# Patient Record
Sex: Male | Born: 1940 | Hispanic: No | Marital: Married | Smoking: Former smoker
Health system: Southern US, Community
[De-identification: ages and names within clinical notes are randomized; demographics above are authoritative.]

## PROBLEM LIST (undated history)

## (undated) ENCOUNTER — Inpatient Hospital Stay: Admission: EM | Payer: Self-pay

## (undated) DIAGNOSIS — I10 Essential (primary) hypertension: Secondary | ICD-10-CM

## (undated) DIAGNOSIS — M1990 Unspecified osteoarthritis, unspecified site: Secondary | ICD-10-CM

## (undated) DIAGNOSIS — C679 Malignant neoplasm of bladder, unspecified: Secondary | ICD-10-CM

## (undated) DIAGNOSIS — I714 Abdominal aortic aneurysm, without rupture, unspecified: Secondary | ICD-10-CM

## (undated) DIAGNOSIS — R351 Nocturia: Secondary | ICD-10-CM

## (undated) DIAGNOSIS — I2510 Atherosclerotic heart disease of native coronary artery without angina pectoris: Secondary | ICD-10-CM

## (undated) DIAGNOSIS — E785 Hyperlipidemia, unspecified: Secondary | ICD-10-CM

## (undated) DIAGNOSIS — I5032 Chronic diastolic (congestive) heart failure: Secondary | ICD-10-CM

## (undated) DIAGNOSIS — Z9861 Coronary angioplasty status: Secondary | ICD-10-CM

## (undated) DIAGNOSIS — Z8551 Personal history of malignant neoplasm of bladder: Secondary | ICD-10-CM

## (undated) HISTORY — DX: Atherosclerotic heart disease of native coronary artery without angina pectoris: I25.10

## (undated) HISTORY — DX: Abdominal aortic aneurysm, without rupture: I71.4

## (undated) HISTORY — DX: Abdominal aortic aneurysm, without rupture, unspecified: I71.40

## (undated) HISTORY — PX: LUMBAR FUSION: SHX111

## (undated) HISTORY — DX: Personal history of malignant neoplasm of bladder: Z85.51

## (undated) HISTORY — DX: Essential (primary) hypertension: I10

---

## 1994-11-17 HISTORY — PX: CORONARY ANGIOPLASTY: SHX604

## 1999-11-18 HISTORY — PX: CORONARY ANGIOPLASTY WITH STENT PLACEMENT: SHX49

## 2000-04-13 ENCOUNTER — Inpatient Hospital Stay (HOSPITAL_COMMUNITY): Admission: EM | Admit: 2000-04-13 | Discharge: 2000-04-16 | Payer: Self-pay | Admitting: *Deleted

## 2000-04-13 ENCOUNTER — Encounter: Payer: Self-pay | Admitting: Emergency Medicine

## 2000-04-14 ENCOUNTER — Encounter: Payer: Self-pay | Admitting: Cardiovascular Disease

## 2007-04-06 ENCOUNTER — Inpatient Hospital Stay (HOSPITAL_COMMUNITY): Admission: EM | Admit: 2007-04-06 | Discharge: 2007-04-10 | Payer: Self-pay | Admitting: Emergency Medicine

## 2010-08-27 ENCOUNTER — Ambulatory Visit (HOSPITAL_COMMUNITY)
Admission: RE | Admit: 2010-08-27 | Discharge: 2010-08-27 | Payer: Self-pay | Source: Home / Self Care | Admitting: Urology

## 2010-08-27 HISTORY — PX: TRANSURETHRAL RESECTION OF BLADDER TUMOR: SHX2575

## 2010-10-22 ENCOUNTER — Ambulatory Visit
Admission: RE | Admit: 2010-10-22 | Discharge: 2010-10-22 | Payer: Self-pay | Source: Home / Self Care | Attending: Urology | Admitting: Urology

## 2010-10-22 HISTORY — PX: OTHER SURGICAL HISTORY: SHX169

## 2011-01-28 LAB — POCT I-STAT, CHEM 8
BUN: 20 mg/dL (ref 6–23)
Calcium, Ion: 1.19 mmol/L (ref 1.12–1.32)
Chloride: 108 mEq/L (ref 96–112)
Glucose, Bld: 105 mg/dL — ABNORMAL HIGH (ref 70–99)
HCT: 42 % (ref 39.0–52.0)
Potassium: 4 mEq/L (ref 3.5–5.1)

## 2011-01-30 LAB — CBC
HCT: 42.4 % (ref 39.0–52.0)
MCH: 29.7 pg (ref 26.0–34.0)
MCV: 84.8 fL (ref 78.0–100.0)
RBC: 5 MIL/uL (ref 4.22–5.81)
WBC: 7.8 10*3/uL (ref 4.0–10.5)

## 2011-01-30 LAB — BASIC METABOLIC PANEL
BUN: 22 mg/dL (ref 6–23)
CO2: 29 mEq/L (ref 19–32)
Chloride: 107 mEq/L (ref 96–112)
Creatinine, Ser: 0.91 mg/dL (ref 0.4–1.5)
GFR calc Af Amer: >60 mL/min (ref >60)
Potassium: 3.8 mEq/L (ref 3.5–5.1)

## 2011-04-01 NOTE — Discharge Summary (Signed)
Ricardo [name], USERY NO.:  [id number]   MEDICAL RECORD NO.:  [id number]          PATIENT TYPE:  INP   LOCATION:  3728                         FACILITY:  [facility]   PHYSICIAN:  Ricardo [name], MDDATE OF BIRTH:  March 04, 1941   DATE OF ADMISSION:  04/05/2007  DATE OF DISCHARGE:  04/10/2007                               DISCHARGE SUMMARY   PRIMARY CARE Ricardo [name]:  Dr. Nicholos [name].   ADMITTING DIAGNOSES:  1. Cellulitis.  2. Tachycardia and __________ .  3. Hypertension.  4. History of coronary artery disease.   DISCHARGE DIAGNOSES:  1. Cellulitis of the left lower leg.  2. Hypertension.  3. History of coronary artery disease.   DISCHARGE MEDICATIONS:  1. Augmentin 875 mg p.o. twice daily x1 week.  2. Cardizem CD 120 mg once daily.  3. Lisinopril 40 mg p.o. once daily.  4. Maxzide 37.5/25, 1 tab p.o. once daily.  5. Crestor.   CONSULTS:  None.   ESSENTIALLY LABORATORY:  The blood culture grew G. Streptococcus.   BRIEF HISTORY AND HOSPITAL COURSE:  The patient is a 70 year old male  with past medical history significant for coronary artery disease,  status post angioplasty and hypertension.  The patient presented with  fever, chills, and cellulitis around the left ankle.  On presentation,  the white blood count was 15,000.  Blood culture done on admission grew  group G Streptococcus.  On admission, the patient was started on IV  vancomycin.  Based on the blood culture, this was changed to IV  ceftriaxone.  The patient's cellulitis has improved significantly.  White count is down to normal.  He will be discharged back home on  Augmentin 875 mg twice daily x1 week.   DISCHARGE PLANS:  1. Discharge home today.  2. Follow up with the primary care Ricardo [name], Dr. Elias [name] in 1      week.  3. Augmentin 875 mg p.o. twice daily x1 week.  4. Activity as tolerated.  5. Regular diet.      Ricardo Chapel, MD  Electronically Signed     SIO/MEDQ  D:   04/10/2007  T:  04/10/2007  Job:  [id number]   cc:   Ricardo [name]. Ricardo [name], M.D.

## 2011-04-01 NOTE — H&P (Signed)
Ricardo [name], Ricardo [name]:  [id number]   MEDICAL RECORD NO.:  [id number]          PATIENT TYPE:  EMS   LOCATION:  MAJO                         FACILITY:  [facility]   PHYSICIAN:  Hollice Espy, M.D.DATE OF BIRTH:  1941-04-29   DATE OF ADMISSION:  04/05/2007  DATE OF DISCHARGE:                              HISTORY & PHYSICAL   PRIMARY CARE PHYSICIAN:  Robert A. Nicholos Johns, M.D.   ATTENDING PHYSICIAN:  Corinna L. Lendell Caprice, M.D.   CHIEF COMPLAINT:  Rigors and chills.   HISTORY OF PRESENT ILLNESS:  Patient is a 70 year old white male with a  past medical history of CAD, status post angioplasty about 10 years ago,  plus hypertension, who was recently out to visit in [place].  He  returned several days ago.  He does not recall any bug bites, animal  bites, or injury to his left leg, but today developed rigors and chills  which were so severe that he could barely stand.  He came into the  emergency room and noted to be tachycardic, having a temperature as well  as having some erythema of the left ankle concerning for cellulitis.  Patient had labs checked.  He was found to have a white count of 15.  His urinalysis and chest x-ray were unremarkable.   Patient was given a dose of vancomycin in the emergency room.  He says  he feels a little bit better but still feels slightly rough.  He denies  any headache, visual changes, dysphagia, chest pain, palpitations,  shortness of breath, wheezing, coughing.  No abdominal pain.  No  hematuria, dysuria, constipation, diarrhea, focal extremity numbness,  weakness, or pain.  He does not note any discomfort in that left ankle.  His review of systems is otherwise negative.   Patient's past medical history includes hypertension, CAD, status post  angioplasty about 10 years ago.   MEDICATIONS:  He is on lisinopril 40, triamterene/hydrochlorothiazide,  Cardizem, and Crestor.   ALLERGIES:  He has no known drug allergies.   SOCIAL  HISTORY:  He denies any tobacco, alcohol, or drug use.   FAMILY HISTORY:  Noncontributory.   PHYSICAL EXAMINATION:  VITALS ON ADMISSION:  Temp 103.4, heart rate 136,  now down to 111, blood pressure 156/85, respirations 20, O2 sat 98% on  room air.  GENERAL:  Patient is alert and oriented x3, in no apparent distress.  HEENT:  Normocephalic and atraumatic.  Mucous membranes are slightly  dry.  NECK:  He has no carotid bruits.  HEART:  Regular rhythm.  Mild tachycardia.  LUNGS:  Clear to auscultation bilaterally.  ABDOMEN:  Soft, nontender, nondistended.  Positive bowel sounds.  EXTREMITIES:  No clubbing, cyanosis or edema.  He has some erythematous  areas of the left ankle with two small swollen, slightly raised areas  consistent with a possible previous small bite.   LAB WORK:  White count 15.3, H&H 14.6 and 42.8, MCV 85, platelet count  123, a 91% shift.  Sodium 140, potassium 2.7, chloride 109, bicarb 25,  BUN 23, creatinine 1.2, glucose 114, CPK 31.5, MB less than  1.  Troponin  I less than 0.05.  Urinalysis just notes a moderate amount of  hemoglobin, total protein of 30, and 7-10 red cells.   Chest x-ray is as per HPI.   EKG shows a sinus tachycardia.   ASSESSMENT/PLAN:  1. Cellulitis:  Because of the possibility of a staph infection, we      will put the patient on vancomycin plus Avelox to cover for full-      spectrum antibiotics.  The other concerning issues with the rapid      respiratory rate and tachycardia, especially in a patient with a      recent plane trip, do not want to miss out on a possible pulmonary      embolus.  Will also check an arterial blood gas.  If he shows signs      of hypoxia, will check a CT angio of the chest.  2. Tachycardia and tachypnea:  Please see #1.  3. Hypertension:  Will continue the patient's Cardizem.  4. Coronary artery disease:  Continue Crestor.      Hollice Espy, M.D.  Electronically Signed     SKK/MEDQ  D:   04/06/2007  T:  04/06/2007  Job:  [id number]   cc:   Molly Maduro A. Nicholos Johns, M.D.

## 2011-04-04 NOTE — Discharge Summary (Signed)
Ricardo [name], [name]:  [id number]   MEDICAL RECORD NO.:  [id number]          PATIENT TYPE:  INP   LOCATION:  3728                         FACILITY:  [facility]   PHYSICIAN:  Barnetta Chapel, MDDATE OF BIRTH:  January 23, 1941   DATE OF ADMISSION:  04/05/2007  DATE OF DISCHARGE:  04/10/2007                               DISCHARGE SUMMARY   PRIMARY CARE Ricardo [name]:  Dr. Elias [name].   DISCHARGE DIAGNOSES:  1. Cellulitis of the left lower extremity.  2. Hypertension.  3. History of coronary artery disease.   DISCHARGE MEDICATIONS:  1. Augmentin 875 mg p.o. twice daily for 1 week.  2. Toprol-XL 50 mg p.o. once daily.  3. Lisinopril 40 mg p.o. once daily.  4. Crestor.   CONSULTATIONS:  None.   LABORATORY DATA:  The blood culture grew group G Streptococcus.   BRIEF HISTORY AND HOSPITAL COURSE:  Refer to the H&P done on Apr 05, 2007.  The patient is a 70 year old male with past medical history  significant for coronary artery disease, status post angioplasty, and  hypertension.  The patient presented with a cellulitis of the lower  extremity edema.   The patient was admitted to the regular medical floor.  He was initially  started on IV Avelox and IV vancomycin.  Blood was sent for culture and  sensitivity; this eventually grew out Streptococcus.  The patient's  antibiotics were changed to Augmentin on discharge.  During his stay in  the hospital, his blood pressure was noted to be elevated; he was  initially put on Norvasc.  However, the patient preferred to be started  back on Toprol-XL.  The patient is being discharged home on Toprol-XL.  There will be need to monitor the patient's blood pressure.   DISCHARGE PLANS:  1. To discharge the patient home today.  2. Augmentin 875 mg p.o. twice daily for 1 week.  3. Cardiac diet.  4. Activity as tolerated.  5. Follow up with the primary care Ricardo [name] in 1 week.      Barnetta Chapel, MD  Electronically  Signed     SIO/MEDQ  D:  05/20/2007  T:  05/20/2007  Job:  [id number]   cc:   Ricardo [name], M.D.

## 2011-04-04 NOTE — Cardiovascular Report (Signed)
[place]  Patient:    Ricardo [name], Ricardo [name]                       MRN: [id number] Proc. Date: 04/15/00 Adm. Date:  [id number] Attending:  Ricki Rodriguez CC:         Cardiac Catheterization Laboratory                        Cardiac Catheterization  CINE NUMBER:  [id number]  PROCEDURE:  Left heart catheterization, selective coronary angiography and left ventricular function study, and a primary stent placement in right coronary artery.  INDICATIONS:  This 70 year old white male had substernal chest pain, abnormal ECG and chest pain with stress test.  COMPLICATIONS:  None.  APPROACH:  Right femoral artery using 6 French diagnostic catheters.  HEMODYNAMIC DATA:  The left ventricular pressure was 179/17 mmHg and the aortic pressure was 179/83 mmHg.  LEFT VENTRICULOGRAM:  The left ventriculogram showed mild inferior anterior wall hypokinesia with an ejection fraction of 50%.  CORONARY ANATOMY:  Left main:  The left main coronary artery was short.  Left left anterior descending:  Left anterior descending coronary artery had ostial 30% eccentric stenosis, small diagonal #1 and 2 vessels and normal diagonal #3 vessel.  Left circumflex:  The left circumflex coronary artery was large and dominant and obtuse marginal branch 1 and 2 very small vessel.  Obtuse marginal branch 3 had an ostial eccentric 50% stenosis.  Obtuse marginal branch 4, 5, and 6 were unremarkable and posterior descending coronary artery was also unremarkable.  Right coronary artery:  The right coronary artery had proximal 80% stenosis.  PERCUTANEOUS TRANSLUMINAL CORONARY ANGIOPLASTY PRIMARY STENT PLACEMENT IN RIGHT CORONARY ARTERY:  The PTCA right coronary artery was carried out using 8 mm by 2.5 mm Tetra stent deployed at 16 atmospheres for 25 seconds.  The 80% stenosis was reduced to 0% with TIMI grade 3 flow.  The patient had no complications.  IMPRESSION: 1. Multivessel  coronary artery disease. 2. Preserved left ventricular systolic function. 3. Successful primary stenting of right coronary artery.  RECOMMENDATIONS:  The patient will receive IV heparin and nitroglycerin in the angioplasty unit.  The IIb/IIIa agent was awaited due to recent hematoma formation in the left forearm. DD:  04/15/00 TD:  04/16/00 Job: [id number]

## 2011-04-04 NOTE — Discharge Summary (Signed)
[place]  Patient:    Ricardo [name], Ricardo [name]                       MRN: [id number] Adm. Date:  [id number] Disc. Date: [id number] Attending:  Orpah Cobb S                           Discharge Summary  PRINCIPAL DIAGNOSES: 1. Unstable angina. 2. Multivessel coronary artery disease. 3. Percutaneous transluminal coronary angioplasty and stent placement in right    coronary artery. 4. Hypertension.  PRINCIPAL PROCEDURE:  Left heart catheterization, selective coronary angiography, left ventricular function study, and percutaneous transluminal coronary angioplasty and stent placement in right coronary artery, done by Dr. Orpah Cobb on Apr 15, 2000.  DISCHARGE MEDICATIONS: 1. Lipitor 10 mg one p.o. daily. 2. Norvasc 5 mg one p.o. daily. 3. Coated aspirin 325 mg one p.o. daily. 4. Plavix 75 mg one p.o. daily. 5. Nitroglycerin 0.4 mg tablet one sublingual every five minutes x 3 as needed    for chest pain.  DISCHARGE ACTIVITY:  As tolerated.  DISCHARGE DIET:  Low-fat, low-salt diet as tolerated.  WOUND CARE INSTRUCTIONS:  Patient to notify doctor if he has right groin pain, swelling, or discharge.  FOLLOW-UP APPOINTMENT:  Dr. Orpah Cobb in two weeks.  Patient to call [phone].  HISTORY OF PRESENT ILLNESS:  This 70 year old Hispanic male complains of substernal chest pain, 2/10, increased with stress.  Patient had a remote history of coronary artery disease and angioplasty.  Patient denied any sweating spell, however admitted to increasing weakness.  PHYSICAL EXAMINATION:  VITAL SIGNS:  Temperature 97, pulse 57, respirations 20, blood pressure 171/86, height 5 feet, 9 inches, weight 170 pounds.  GENERAL:  Patient alert and oriented x 3.  HEENT:  Head normocephalic and atraumatic.  Eyes:  Pupils equal and reactive to light.  Extraocular movements intact.  Ears, nose, throat:  Mucous membranes pink and moist.  NECK:  No JVD, no carotid  bruits.  LUNGS:  Clear bilaterally.  CHEST WALL:  Nontender.  HEART:  Normal S1, S2.  ABDOMEN:  Soft and nontender.  EXTREMITIES:  No edema, cyanosis, clubbing.  CNS:  Cranial nerves II-XII grossly intact.  LABORATORY DATA:  Normal hemoglobin, hematocrit, WBC count, platelet count, normal electrolytes, BUN, creatinine.  Cholesterol borderline, 189. Triglycerides 340, HDL 36.  EKG:  Sinus bradycardia.  Chest x-ray:  No acute process.  Cardiac catheterization showed an 80% right coronary artery proximal lesion. PTCA stent of right coronary artery distal lesion to 0%.  Left ventricular function was preserved at 50% with mild inferior wall hypokinesia.  HOSPITAL COURSE:  Patient was admitted to telemetry unit.  Myocardial infarction was ruled out.  He underwent cardiac catheterization in spite of normal adenosine Cardiolite, SPECT exam.  On close review, it appeared to be some what abnormal.  Patient had 80% right coronary artery proximal lesion. This was successfully treated with 8 mm x 2.5 mm Tetra stent deployed at 16 atmospheres for 25 seconds, reducing 80% lesion to 0% with a TIMI grade 3 flow.   Patient had no additional chest pain post procedure and ambulated well, and was discharged home on Apr 16, 2000, in satisfactory condition with follow-up by me in two weeks. DD:  05/08/00 TD:  05/09/00 Job: [id number]

## 2011-06-25 ENCOUNTER — Other Ambulatory Visit: Payer: Self-pay | Admitting: Cardiology

## 2011-06-25 DIAGNOSIS — I10 Essential (primary) hypertension: Secondary | ICD-10-CM

## 2011-07-01 ENCOUNTER — Other Ambulatory Visit: Payer: Self-pay | Admitting: Cardiology

## 2011-07-01 ENCOUNTER — Ambulatory Visit
Admission: RE | Admit: 2011-07-01 | Discharge: 2011-07-01 | Disposition: A | Discharge status: Home / Self Care | Payer: Medicare Other | Source: Ambulatory Visit | Attending: Cardiology | Admitting: Cardiology

## 2011-07-01 DIAGNOSIS — I10 Essential (primary) hypertension: Secondary | ICD-10-CM

## 2011-10-02 ENCOUNTER — Other Ambulatory Visit: Payer: Self-pay | Admitting: Urology

## 2011-11-06 ENCOUNTER — Encounter (HOSPITAL_BASED_OUTPATIENT_CLINIC_OR_DEPARTMENT_OTHER): Payer: Self-pay | Admitting: *Deleted

## 2011-11-06 NOTE — Progress Notes (Signed)
NPO AFTER MN. PT ARRIVES AT 0930. NEEDS ISTAT. CURRENT EKG, LAST NOTE, STRESS TEST TO BE FAXED FROM DR Donnie Aho. WILL TAKES TAZTIA AM OF SURG. W/ SIP OF WATER.

## 2011-11-11 NOTE — H&P (Signed)
Chief Complaint  Bladder cancer   History of Present Illness        70 year old male who has a history of bladder cancer.  This was treated in the past with intravesical BCG.  His last maintenance dose was completed 07/22/11.  On office cystoscopy 09/26/11 he had an erythema around his bladder neck which was concerning.  I recommended bladder biopsy and bilateral retrograde pyelograms.  We discussed the risks, benefits, alternatives, and likelihood of achieving goals.  Today I explained the risks of the procedure which include, but are not limited to, heart attack, stroke, pulmonary embolus, pneumonia, death, positioning injury, permanent or temporary neuropathy, bleeding, infection, need for further procedure, damage to contiguous structures, bladder perforation, and need for further procedure depending upon findings inside the bladder.  We discussed the possibility of finding a mass on retrograde pyelogram and the subsequent need for ureteroscopy, biopsy, laser ablation, and ureteral stent.  We discussed the risks, benefits, alternatives, and likelihood of achieving goals with this intervention.  He does wish to have this done if there are masses or concerning findings on retrograde pyelogram.  He has a history of heart disease, so I had him see his cardiologist, Dr. Donnie Aho.  The patient wascleared for surgery as being average risk.   Past Medical History Problems  1. History of  Anxiety (Symptom) 300.00 2. History of  Esophageal Reflux 530.81 3. History of  Glaucoma 365.9 4. History of  Gross Hematuria 599.71 5. History of  Heart Disease 429.9 6. History of  Hypercholesterolemia 272.0 7. History of  Hypertension 401.9 8. History of  Sleep Apnea 780.57  Surgical History Problems  1. History of  Back Surgery 2. History of  Cystoscopy With Fulguration Medium Lesion (2-5cm) 3. History of  Cystoscopy With Fulguration Small Lesion (5-68mm)  Current Meds 1. Crestor 20 MG Oral Tablet; Therapy:  (Recorded:06Oct2011) to 2. Lisinopril 40 MG Oral Tablet; Therapy: (Recorded:06Oct2011) to 3. Tamsulosin HCl 0.4 MG Oral Capsule; TAKE 1 CAPSULE Bedtime; Therapy: 06Aug2012 to  (Evaluate:01Aug2013)  Requested for: 06Aug2012; Last Rx:06Aug2012 4. Taztia XT 360 MG Oral Capsule Extended Release 24 Hour; Therapy: (Recorded:06Oct2011) to 5. Triamterene-HCTZ 37.5-25 MG Oral Tablet; Therapy: (Recorded:06Oct2011) to  Allergies Medication  1. No Known Drug Allergies  Family History Problems  1. Family history of  Death In The Family Mother 95 kidney failure; hypertension  Social History Problems  1. Caffeine Use 2. Marital History - Currently Married 3. Never A Smoker Denied  4. History of  Alcohol Use  Review of Systems  Gastrointestinal: no nausea and no heartburn.  Constitutional: night sweats, but no fever and no recent weight loss.  Integumentary: no new skin rashes or lesions and no pruritus.  Eyes: blurred vision, but no diplopia.  ENT: sinus problems, but no sore throat.  Hematologic/Lymphatic: a tendency to easily bruise, but no swollen glands.  Cardiovascular: no chest pain and no leg swelling.  Respiratory: no shortness of breath and no cough.  Endocrine: polydipsia, but no hot flashes.  Musculoskeletal: back pain and joint pain.  Neurological: headache, but no dizziness.  Psychiatric: no anxiety and no depression.     Physical Exam Constitutional: Well nourished and well developed.  ENT:. The ears and nose are normal in appearance. The oropharynx is normal.  Neck: The appearance of the neck is normal and no neck mass is present.  Pulmonary: No respiratory distress and normal respiratory rhythm and effort.  Cardiovascular: Heart rate and rhythm are normal . No peripheral  edema.  Abdomen: The abdomen is soft and nontender. No masses are palpated. The abdomen is no rebound. No CVA tenderness.  Lymphatics: The posterior cervical and supraclavicular nodes are not enlarged  or tender.  Skin: Normal skin turgor and no visible rash.  Neuro/Psych:. Mood and affect are appropriate. No focal sensory deficits.      Assessment Bladder Cancer  Plan Proceed to OR for cystoscopy, bladder biopsy, and bilateral retrograde pyelogram.

## 2011-11-12 ENCOUNTER — Encounter (HOSPITAL_BASED_OUTPATIENT_CLINIC_OR_DEPARTMENT_OTHER): Payer: Self-pay | Admitting: Anesthesiology

## 2011-11-12 ENCOUNTER — Ambulatory Visit (HOSPITAL_BASED_OUTPATIENT_CLINIC_OR_DEPARTMENT_OTHER): Payer: Medicare Other | Admitting: Anesthesiology

## 2011-11-12 ENCOUNTER — Other Ambulatory Visit: Payer: Self-pay | Admitting: Urology

## 2011-11-12 ENCOUNTER — Encounter (HOSPITAL_BASED_OUTPATIENT_CLINIC_OR_DEPARTMENT_OTHER): Payer: Self-pay | Admitting: *Deleted

## 2011-11-12 ENCOUNTER — Encounter (HOSPITAL_BASED_OUTPATIENT_CLINIC_OR_DEPARTMENT_OTHER)
Admission: RE | Disposition: A | Discharge status: Home / Self Care | Payer: Self-pay | Source: Ambulatory Visit | Attending: Urology

## 2011-11-12 ENCOUNTER — Ambulatory Visit (HOSPITAL_BASED_OUTPATIENT_CLINIC_OR_DEPARTMENT_OTHER)
Admission: RE | Admit: 2011-11-12 | Discharge: 2011-11-12 | Disposition: A | Discharge status: Home / Self Care | Payer: Medicare Other | Source: Ambulatory Visit | Attending: Urology | Admitting: Urology

## 2011-11-12 DIAGNOSIS — Z8551 Personal history of malignant neoplasm of bladder: Secondary | ICD-10-CM | POA: Insufficient documentation

## 2011-11-12 DIAGNOSIS — Z79899 Other long term (current) drug therapy: Secondary | ICD-10-CM | POA: Insufficient documentation

## 2011-11-12 DIAGNOSIS — E7800 Pure hypercholesterolemia, unspecified: Secondary | ICD-10-CM | POA: Insufficient documentation

## 2011-11-12 DIAGNOSIS — K219 Gastro-esophageal reflux disease without esophagitis: Secondary | ICD-10-CM | POA: Insufficient documentation

## 2011-11-12 DIAGNOSIS — N323 Diverticulum of bladder: Secondary | ICD-10-CM | POA: Insufficient documentation

## 2011-11-12 DIAGNOSIS — D494 Neoplasm of unspecified behavior of bladder: Secondary | ICD-10-CM | POA: Insufficient documentation

## 2011-11-12 DIAGNOSIS — I10 Essential (primary) hypertension: Secondary | ICD-10-CM | POA: Insufficient documentation

## 2011-11-12 DIAGNOSIS — N329 Bladder disorder, unspecified: Secondary | ICD-10-CM

## 2011-11-12 DIAGNOSIS — G4730 Sleep apnea, unspecified: Secondary | ICD-10-CM | POA: Insufficient documentation

## 2011-11-12 HISTORY — DX: Unspecified osteoarthritis, unspecified site: M19.90

## 2011-11-12 HISTORY — PX: TRANSURETHRAL RESECTION OF BLADDER TUMOR: SHX2575

## 2011-11-12 HISTORY — DX: Nocturia: R35.1

## 2011-11-12 HISTORY — DX: Coronary angioplasty status: Z98.61

## 2011-11-12 HISTORY — DX: Atherosclerotic heart disease of native coronary artery without angina pectoris: I25.10

## 2011-11-12 HISTORY — DX: Essential (primary) hypertension: I10

## 2011-11-12 HISTORY — DX: Malignant neoplasm of bladder, unspecified: C67.9

## 2011-11-12 HISTORY — DX: Hyperlipidemia, unspecified: E78.5

## 2011-11-12 HISTORY — PX: CYSTOSCOPY W/ RETROGRADES: SHX1426

## 2011-11-12 LAB — POCT I-STAT 4, (NA,K, GLUC, HGB,HCT)
Glucose, Bld: 105 mg/dL — ABNORMAL HIGH (ref 70–99)
Potassium: 3.6 mEq/L (ref 3.5–5.1)

## 2011-11-12 SURGERY — CYSTOSCOPY, WITH RETROGRADE PYELOGRAM
Anesthesia: General | Site: Ureter | Wound class: Clean Contaminated

## 2011-11-12 MED ORDER — TOTAL CARDIO HEALTH FORMULA PO CAPS: 1.0000 | ORAL_CAPSULE | ORAL | Status: DC

## 2011-11-12 MED ORDER — HYOSCYAMINE SULFATE 0.125 MG PO TABS: 0.1250 mg | ORAL_TABLET | ORAL | Status: AC | PRN

## 2011-11-12 MED ORDER — LACTATED RINGERS IV SOLN
INTRAVENOUS | Status: DC
  Administered 2011-11-12 (×2): via INTRAVENOUS
  Administered 2011-11-12: 100 mL/h via INTRAVENOUS

## 2011-11-12 MED ORDER — OMEGA-3 FATTY ACIDS 1000 MG PO CAPS: 2.0000 g | ORAL_CAPSULE | Freq: Every day | ORAL | Status: DC

## 2011-11-12 MED ORDER — BELLADONNA ALKALOIDS-OPIUM 16.2-60 MG RE SUPP
RECTAL | Status: DC | PRN
  Administered 2011-11-12: 1 via RECTAL

## 2011-11-12 MED ORDER — PROMETHAZINE HCL 25 MG/ML IJ SOLN: 6.2500 mg | INTRAMUSCULAR | Status: DC | PRN

## 2011-11-12 MED ORDER — CEFAZOLIN SODIUM 1-5 GM-% IV SOLN
1.0000 g | INTRAVENOUS | Status: AC
  Administered 2011-11-12: 1 g via INTRAVENOUS

## 2011-11-12 MED ORDER — ASPIRIN 81 MG PO TABS: 81.0000 mg | ORAL_TABLET | Freq: Every day | ORAL | Status: AC

## 2011-11-12 MED ORDER — CIPROFLOXACIN HCL 500 MG PO TABS: 500.0000 mg | ORAL_TABLET | Freq: Two times a day (BID) | ORAL | Status: AC

## 2011-11-12 MED ORDER — LACTATED RINGERS IV SOLN
INTRAVENOUS | Status: DC
  Administered 2011-11-12: 14:00:00 via INTRAVENOUS

## 2011-11-12 MED ORDER — SENNOSIDES-DOCUSATE SODIUM 8.6-50 MG PO TABS: 1.0000 | ORAL_TABLET | Freq: Two times a day (BID) | ORAL | Status: AC

## 2011-11-12 MED ORDER — PHENAZOPYRIDINE HCL 100 MG PO TABS: 100.0000 mg | ORAL_TABLET | Freq: Three times a day (TID) | ORAL | Status: AC | PRN

## 2011-11-12 MED ORDER — GLYCOPYRROLATE 0.2 MG/ML IJ SOLN
INTRAMUSCULAR | Status: DC | PRN
  Administered 2011-11-12 (×2): 0.2 mg via INTRAVENOUS

## 2011-11-12 MED ORDER — FENTANYL CITRATE 0.05 MG/ML IJ SOLN
INTRAMUSCULAR | Status: DC | PRN
  Administered 2011-11-12: 50 ug via INTRAVENOUS
  Administered 2011-11-12: 25 ug via INTRAVENOUS
  Administered 2011-11-12 (×2): 50 ug via INTRAVENOUS
  Administered 2011-11-12: 25 ug via INTRAVENOUS

## 2011-11-12 MED ORDER — ONDANSETRON HCL 4 MG/2ML IJ SOLN
INTRAMUSCULAR | Status: DC | PRN
  Administered 2011-11-12: 4 mg via INTRAVENOUS

## 2011-11-12 MED ORDER — IOHEXOL 350 MG/ML SOLN
INTRAVENOUS | Status: DC | PRN
  Administered 2011-11-12: 50 mL via INTRAVENOUS

## 2011-11-12 MED ORDER — LIDOCAINE HCL (CARDIAC) 20 MG/ML IV SOLN
INTRAVENOUS | Status: DC | PRN
  Administered 2011-11-12: 50 mg via INTRAVENOUS

## 2011-11-12 MED ORDER — FENTANYL CITRATE 0.05 MG/ML IJ SOLN
25.0000 ug | INTRAMUSCULAR | Status: DC | PRN
  Administered 2011-11-12: 50 ug via INTRAVENOUS
  Administered 2011-11-12: 25 ug via INTRAVENOUS

## 2011-11-12 MED ORDER — OXYCODONE-ACETAMINOPHEN 5-325 MG PO TABS: 1.0000 | ORAL_TABLET | ORAL | Status: AC | PRN

## 2011-11-12 MED ORDER — DEXAMETHASONE SODIUM PHOSPHATE 4 MG/ML IJ SOLN
INTRAMUSCULAR | Status: DC | PRN
  Administered 2011-11-12: 8 mg via INTRAVENOUS

## 2011-11-12 MED ORDER — PROPOFOL 10 MG/ML IV EMUL
INTRAVENOUS | Status: DC | PRN
  Administered 2011-11-12: 200 mg via INTRAVENOUS

## 2011-11-12 MED ORDER — SODIUM CHLORIDE 0.9 % IR SOLN
Status: DC | PRN
  Administered 2011-11-12: 3000 mL

## 2011-11-12 SURGICAL SUPPLY — 61 items
ADAPTER CATH URET PLST 4-6FR (CATHETERS) ×1 IMPLANT
ADPR CATH URET STRL DISP 4-6FR (CATHETERS) ×2
BAG DRAIN URO-CYSTO SKYTR STRL (DRAIN) ×3 IMPLANT
BAG DRN ANRFLXCHMBR STRAP LEK (BAG) ×2
BAG DRN UROCATH (DRAIN) ×2
BAG URINE DRAINAGE (UROLOGICAL SUPPLIES) ×1 IMPLANT
BAG URINE LEG 19OZ MD ST LTX (BAG) ×1 IMPLANT
BASKET LASER NITINOL 1.9FR (BASKET) IMPLANT
BASKET STNLS GEMINI 4WIRE 3FR (BASKET) IMPLANT
BASKET ZERO TIP NITINOL 2.4FR (BASKET) IMPLANT
BRUSH URET BIOPSY 3F (UROLOGICAL SUPPLIES) IMPLANT
BSKT STON RTRVL 120 1.9FR (BASKET)
BSKT STON RTRVL GEM 120X11 3FR (BASKET)
BSKT STON RTRVL ZERO TP 2.4FR (BASKET)
CANISTER SUCT LVC 12 LTR MEDI- (MISCELLANEOUS) IMPLANT
CATH FOLEY 2WAY SLVR  5CC 20FR (CATHETERS)
CATH FOLEY 2WAY SLVR  5CC 22FR (CATHETERS)
CATH FOLEY 2WAY SLVR  5CC 24FR (CATHETERS) ×1
CATH FOLEY 2WAY SLVR 30CC 22FR (CATHETERS) ×1 IMPLANT
CATH FOLEY 2WAY SLVR 5CC 20FR (CATHETERS) IMPLANT
CATH FOLEY 2WAY SLVR 5CC 22FR (CATHETERS) IMPLANT
CATH FOLEY 2WAY SLVR 5CC 24FR (CATHETERS) ×2 IMPLANT
CATH HEMA 3WAY 30CC 24FR COUDE (CATHETERS) ×3 IMPLANT
CATH HEMA 3WAY 30CC 24FR RND (CATHETERS) IMPLANT
CATH INTERMIT  6FR 70CM (CATHETERS) IMPLANT
CATH URET 5FR 28IN CONE TIP (BALLOONS)
CATH URET 5FR 28IN OPEN ENDED (CATHETERS) ×1 IMPLANT
CATH URET 5FR 70CM CONE TIP (BALLOONS) IMPLANT
CLOTH BEACON ORANGE TIMEOUT ST (SAFETY) ×3 IMPLANT
DRAPE CAMERA CLOSED 9X96 (DRAPES) ×3 IMPLANT
ELECT BUTTON BIOP 24F 90D PLAS (MISCELLANEOUS) IMPLANT
ELECT BUTTON HF 24-28F 2 30DE (ELECTRODE) IMPLANT
ELECT LOOP MED HF 24F 12D (CUTTING LOOP) ×2 IMPLANT
ELECT LOOP MED HF 24F 12D CBL (CLIP) ×1 IMPLANT
ELECT REM PT RETURN 9FT ADLT (ELECTROSURGICAL) ×3
ELECTRODE REM PT RTRN 9FT ADLT (ELECTROSURGICAL) ×2 IMPLANT
EVACUATOR MICROVAS BLADDER (UROLOGICAL SUPPLIES) IMPLANT
GLOVE BIO SURGEON STRL SZ8 (GLOVE) ×3 IMPLANT
GLOVE BIOGEL M 6.5 STRL (GLOVE) ×1 IMPLANT
GLOVE ECLIPSE 6.0 STRL STRAW (GLOVE) ×1 IMPLANT
GLOVE ECLIPSE 7.0 STRL STRAW (GLOVE) ×3 IMPLANT
GLOVE INDICATOR 7.5 STRL GRN (GLOVE) ×3 IMPLANT
GOWN PREVENTION PLUS LG XLONG (DISPOSABLE) ×3 IMPLANT
GOWN STRL REIN XL XLG (GOWN DISPOSABLE) ×3 IMPLANT
GUIDEWIRE 0.038 PTFE COATED (WIRE) IMPLANT
GUIDEWIRE ANG ZIPWIRE 038X150 (WIRE) IMPLANT
GUIDEWIRE STR DUAL SENSOR (WIRE) ×3 IMPLANT
HOLDER FOLEY CATH W/STRAP (MISCELLANEOUS) IMPLANT
IV NS IRRIG 3000ML ARTHROMATIC (IV SOLUTION) ×4 IMPLANT
KIT ASPIRATION TUBING (SET/KITS/TRAYS/PACK) ×1 IMPLANT
KIT BALLIN UROMAX 15FX10 (LABEL) IMPLANT
KIT BALLN UROMAX 15FX4 (MISCELLANEOUS) IMPLANT
KIT BALLN UROMAX 26 75X4 (MISCELLANEOUS)
LASER FIBER DISP (UROLOGICAL SUPPLIES) IMPLANT
PACK CYSTOSCOPY (CUSTOM PROCEDURE TRAY) ×3 IMPLANT
PLUG CATH AND CAP STER (CATHETERS) IMPLANT
SET HIGH PRES BAL DIL (LABEL)
SHEATH URET ACCESS 12FR/35CM (UROLOGICAL SUPPLIES) IMPLANT
SHEATH URET ACCESS 12FR/55CM (UROLOGICAL SUPPLIES) IMPLANT
SYR 30ML LL (SYRINGE) ×1 IMPLANT
SYRINGE IRR TOOMEY STRL 70CC (SYRINGE) IMPLANT

## 2011-11-12 NOTE — Anesthesia Postprocedure Evaluation (Signed)
Anesthesia Post Note  Patient: Ricardo [name]  Procedure(s) Performed:  CYSTOSCOPY WITH RETROGRADE PYELOGRAM; TRANSURETHRAL RESECTION OF BLADDER TUMOR (TURBT) - with PK Gyrus c-arm gyrus  digital ureteroscope  Anesthesia type: General  Patient location: PACU  Post pain: Pain level controlled  Post assessment: Post-op Vital signs reviewed  Last Vitals:  Filed Vitals:   11/12/11 1130  BP: 161/71  Pulse: 71  Temp:   Resp: 13    Post vital signs: Reviewed  Level of consciousness: sedated  Complications: No apparent anesthesia complications

## 2011-11-12 NOTE — Anesthesia Preprocedure Evaluation (Signed)
Anesthesia Evaluation  Patient identified by MRN, date of birth, ID band Patient awake    Reviewed: Allergy & Precautions, H&P , NPO status , Patient's Chart, lab work & pertinent test results  Airway Mallampati: II TM Distance: >3 FB Neck ROM: full    Dental No notable dental hx. (+) Partial Upper and Dental Advidsory Given Bridge, multiple caps upper incisors:   Pulmonary neg pulmonary ROS,  clear to auscultation  Pulmonary exam normal       Cardiovascular Exercise Tolerance: Good hypertension, On Medications + CAD (CAD s/p angioplasty 1996, PTCA with stent 2001; patient denies cardiac symptoms) regular Normal    Neuro/Psych Negative Neurological ROS  Negative Psych ROS   GI/Hepatic negative GI ROS, Neg liver ROS,   Endo/Other  Negative Endocrine ROS  Renal/GU negative Renal ROS  Genitourinary negative   Musculoskeletal   Abdominal   Peds  Hematology negative hematology ROS (+)   Anesthesia Other Findings   Reproductive/Obstetrics negative OB ROS                           Anesthesia Physical Anesthesia Plan  ASA: III  Anesthesia Plan: General LMA   Post-op Pain Management:    Induction:   Airway Management Planned:   Additional Equipment:   Intra-op Plan:   Post-operative Plan:   Informed Consent: I have reviewed the patients History and Physical, chart, labs and discussed the procedure including the risks, benefits and alternatives for the proposed anesthesia with the patient or authorized representative who has indicated his/her understanding and acceptance.   Dental Advisory Given  Plan Discussed with: CRNA  Anesthesia Plan Comments:         Anesthesia Quick Evaluation

## 2011-11-12 NOTE — Brief Op Note (Signed)
11/12/2011  11:10 AM  PATIENT:  Ricardo [name]  70 y.o. male  PRE-OPERATIVE DIAGNOSIS:  Bladder Cancer  POST-OPERATIVE DIAGNOSIS:  Bladder Cancer  PROCEDURE:  Procedure(s): CYSTOSCOPY WITH BILATERAL RETROGRADE PYELOGRAM BLADDER BIOPSY TRANSURETHRAL RESECTION OF BLADDER TUMOR (<0.5 cm)  SURGEON:  Surgeon(s): Milford Cage, MD  PHYSICIAN ASSISTANT: None  ASSISTANTS: none   ANESTHESIA:   general  EBL:  Total I/O In: 1000 [I.V.:1000] Out: -   BLOOD ADMINISTERED:none  DRAINS: Urinary Catheter (Foley)   LOCAL MEDICATIONS USED:  NONE  SPECIMEN:  Source of Specimen:  bladder  DISPOSITION OF SPECIMEN:  PATHOLOGY  COUNTS:  YES  TOURNIQUET:  * No tourniquets in log *  DICTATION: .Other Dictation: Dictation Number [id number]  PLAN OF CARE: Discharge to home after PACU  PATIENT DISPOSITION:  PACU - hemodynamically stable.   Delay start of Pharmacological VTE agent (>24hrs) due to surgical blood loss or risk of bleeding:  Yes

## 2011-11-12 NOTE — Transfer of Care (Signed)
Immediate Anesthesia Transfer of Care Note  Patient: Ricardo [name]  Procedure(s) Performed:  CYSTOSCOPY WITH RETROGRADE PYELOGRAM; TRANSURETHRAL RESECTION OF BLADDER TUMOR (TURBT) - with PK Gyrus c-arm gyrus  digital ureteroscope  Patient Location: PACU  Anesthesia Type: General  Level of Consciousness: awake, oriented, sedated and patient cooperative  Airway & Oxygen Therapy: Patient Spontanous Breathing and Patient connected to face mask oxygen  Post-op Assessment: Report given to PACU RN and Post -op Vital signs reviewed and stable  Post vital signs: Reviewed and stable  Complications: No apparent anesthesia complications

## 2011-11-12 NOTE — Anesthesia Procedure Notes (Signed)
Procedure Name: LMA Insertion Date/Time: 11/12/2011 10:24 AM Performed by: Renella Cunas D Pre-anesthesia Checklist: Patient identified, Emergency Drugs available, Suction available and Patient being monitored Patient Re-evaluated:Patient Re-evaluated prior to inductionOxygen Delivery Method: Circle System Utilized Preoxygenation: Pre-oxygenation with 100% oxygen Intubation Type: IV induction Ventilation: Mask ventilation without difficulty LMA: LMA with gastric port inserted LMA Size: 4.0 Number of attempts: 1 Placement Confirmation: positive ETCO2 Tube secured with: Tape Dental Injury: Teeth and Oropharynx as per pre-operative assessment

## 2011-11-13 ENCOUNTER — Encounter (HOSPITAL_BASED_OUTPATIENT_CLINIC_OR_DEPARTMENT_OTHER): Payer: Self-pay | Admitting: Urology

## 2011-11-13 NOTE — Op Note (Signed)
[name], Ricardo [name]:  [id number]  MEDICAL RECORD NO.:  [id number]  LOCATION:                               FACILITY:  [facility]  PHYSICIAN:  Natalia Leatherwood, MD    DATE OF BIRTH:  Dec 15, 1940  DATE OF PROCEDURE:  11/12/2011 DATE OF DISCHARGE:                              OPERATIVE REPORT   SURGEON:  Natalia Leatherwood, MD  ASSISTANT:  None.  PREOPERATIVE DIAGNOSIS:  Bladder lesion with history of bladder cancer.  POSTOPERATIVE DIAGNOSIS:  Bladder lesion with history of bladder cancer.  PROCEDURES PERFORMED:  Cystoscopy, bilateral retrograde pyelogram, fluoroscopy,  bladder biopsy, and transurethral resection of bladder tumor, less than 0.5 cm in size.  HISTORY OF PRESENT ILLNESS:  This is a pleasant gentleman who has a history of bladder cancer.  He was treated with BCG.  After a round of BCG treatment recently, cystoscopy revealed erythema around the bladder neck and it is felt that this should be biopsied to rule out carcinoma in situ.  FINDINGS:  White flat tumor on the left lateral bladder wall.  Erythema at the 5 o'clock position of the bladder neck.  Heaped up area at the edge of bladder diverticulum on the dome.  No filling defects and bilateral retrograde pyelograms.  DRAINS:  Foley catheter.  ESTIMATED BLOOD LOSS:  Less than 10 cc.  SPECIMEN:  Three separate specimens from the bladder sent for pathology, one is the flat white bladder tumor in the left lateral bladder wall, second specimen is bladder erythema at the bladder neck is a biopsy and then biopsy of the edge of the diverticulum on the dome of the bladder.  PROCEDURE IN DETAIL:  Informed consent was obtained.  The patient was taken to the operating room where he was placed in supine position.  IV antibiotics were infused and general anesthesia was induced.  Time-out was performed, in which the correct patient, surgical site, and procedure were identified and agreed upon by the team.   Following this, his was placed in dorsal lithotomy position, making sure to pad all pertinent neurovascular pressure points.  SCDs were in place and on position.  His genitals were then prepped and draped in usual sterile fashion.  Next, a 30 degree cystoscope was advanced through the urethra into the bladder.  The bladder was evaluated in a systematic fashion with a 30 degree and 70 degree cystoscope.  There was found to be a flat white tumor on the left lateral bladder wall which is less than 5 cm in size.  There is also erythema at the bladder neck at approximately 5 o'clock and heaped up area at the edge of the diverticulum on the bladder dome.  After this was done, bilateral retrograde pyelograms were obtained by cannulating each ureteral orifice with a 5-French open-ended Pollack catheter.  This required cannulation first with a sensor tip wire bilaterally and then running the 5-French open-ended catheter over the wire.  Retrograde pyelogram was obtained bilaterally and there were no filling defects in the upper tract or the ureters bilaterally.  Both sides emptied well.  Next, a visual obturator to the Gyrus resectoscope was placed and resection of the white  flat bladder tumor was resected and fulgurated and sent as one pathology specimen.  Next, the erythema at the 5 o'clock position was also biopsied with the Gyrus resectoscope and then fulguration was carried out.  This was sent as a pathology specimen and finally a cold cup biopsy forceps was used to biopsy the heaped up area at the edge of the diverticulum on the bladder dome. This was sent as a third specimen and then Bugbee electrode was used to fulgurate this area.  Because of the biopsy at the bladder neck,  he did have mild amount of bleeding and I was concerned that this could cause clot urinary retention in the future, so a 20-French catheter was placed with 30 cc sterile water placed in the balloon and placed on  mild tension.  This will be removed in the PACU.  Anesthesia was then reversed and he was placed back in supine position.  He will return to my clinic on Friday for catheter removal.          ______________________________ Natalia Leatherwood, MD     DW/MEDQ  D:  11/12/2011  T:  11/13/2011  Job:  [id number]

## 2011-12-01 ENCOUNTER — Other Ambulatory Visit: Payer: Self-pay | Admitting: Cardiology

## 2012-03-05 ENCOUNTER — Other Ambulatory Visit: Payer: Self-pay | Admitting: Cardiology

## 2012-06-28 ENCOUNTER — Other Ambulatory Visit: Payer: Self-pay | Admitting: Cardiology

## 2012-06-28 DIAGNOSIS — I714 Abdominal aortic aneurysm, without rupture: Secondary | ICD-10-CM

## 2012-07-05 ENCOUNTER — Ambulatory Visit
Admission: RE | Admit: 2012-07-05 | Discharge: 2012-07-05 | Disposition: A | Discharge status: Home / Self Care | Payer: Medicare Other | Source: Ambulatory Visit | Attending: Cardiology | Admitting: Cardiology

## 2012-07-05 DIAGNOSIS — I714 Abdominal aortic aneurysm, without rupture: Secondary | ICD-10-CM

## 2012-09-09 ENCOUNTER — Ambulatory Visit: Payer: Medicare Other | Attending: Family Medicine | Admitting: Physical Therapy

## 2012-09-09 DIAGNOSIS — IMO0001 Reserved for inherently not codable concepts without codable children: Secondary | ICD-10-CM | POA: Insufficient documentation

## 2012-09-09 DIAGNOSIS — M2569 Stiffness of other specified joint, not elsewhere classified: Secondary | ICD-10-CM | POA: Insufficient documentation

## 2012-09-09 DIAGNOSIS — M545 Low back pain, unspecified: Secondary | ICD-10-CM | POA: Insufficient documentation

## 2012-09-14 ENCOUNTER — Ambulatory Visit: Payer: Medicare Other | Admitting: Physical Therapy

## 2012-09-16 ENCOUNTER — Ambulatory Visit: Payer: Medicare Other | Admitting: Physical Therapy

## 2013-07-26 ENCOUNTER — Other Ambulatory Visit: Payer: Self-pay | Admitting: Cardiology

## 2013-07-26 DIAGNOSIS — I714 Abdominal aortic aneurysm, without rupture: Secondary | ICD-10-CM

## 2013-08-02 ENCOUNTER — Ambulatory Visit
Admission: RE | Admit: 2013-08-02 | Discharge: 2013-08-02 | Disposition: A | Discharge status: Home / Self Care | Payer: Medicare Other | Source: Ambulatory Visit | Attending: Cardiology | Admitting: Cardiology

## 2013-08-02 DIAGNOSIS — I714 Abdominal aortic aneurysm, without rupture: Secondary | ICD-10-CM

## 2014-08-24 ENCOUNTER — Other Ambulatory Visit: Payer: Self-pay | Admitting: Cardiology

## 2014-08-24 DIAGNOSIS — I714 Abdominal aortic aneurysm, without rupture, unspecified: Secondary | ICD-10-CM

## 2014-09-01 ENCOUNTER — Ambulatory Visit
Admission: RE | Admit: 2014-09-01 | Discharge: 2014-09-01 | Disposition: A | Discharge status: Home / Self Care | Payer: Medicare Other | Source: Ambulatory Visit | Attending: Cardiology | Admitting: Cardiology

## 2014-09-01 DIAGNOSIS — I714 Abdominal aortic aneurysm, without rupture, unspecified: Secondary | ICD-10-CM

## 2015-03-19 ENCOUNTER — Other Ambulatory Visit: Payer: Self-pay | Admitting: Orthopaedic Surgery

## 2015-03-19 DIAGNOSIS — M25532 Pain in left wrist: Secondary | ICD-10-CM

## 2015-03-21 ENCOUNTER — Ambulatory Visit
Admission: RE | Admit: 2015-03-21 | Discharge: 2015-03-21 | Disposition: A | Discharge status: Home / Self Care | Payer: Medicare Other | Source: Ambulatory Visit | Attending: Orthopaedic Surgery | Admitting: Orthopaedic Surgery

## 2015-03-21 DIAGNOSIS — M25532 Pain in left wrist: Secondary | ICD-10-CM

## 2015-05-09 ENCOUNTER — Ambulatory Visit: Payer: Medicare Other | Attending: Orthopaedic Surgery | Admitting: Physical Therapy

## 2015-05-09 DIAGNOSIS — M25532 Pain in left wrist: Secondary | ICD-10-CM | POA: Diagnosis not present

## 2015-05-09 DIAGNOSIS — M6289 Other specified disorders of muscle: Secondary | ICD-10-CM | POA: Insufficient documentation

## 2015-05-09 DIAGNOSIS — R29898 Other symptoms and signs involving the musculoskeletal system: Secondary | ICD-10-CM

## 2015-05-09 NOTE — Therapy (Signed)
[place], [place], [address] Phone: [phone]   Fax:  [phone]  Physical Therapy Evaluation  Patient Details  Name: Ricardo [name] MRN: [id number] Date of Birth: 1941-10-27 Referring Provider:  Garald Balding, MD  Encounter Date: 05/09/2015      PT End of Session - 05/09/15 1703    Visit Number 1   Number of Visits 16   Date for PT Re-Evaluation 07/04/15   PT Start Time 8938   PT Stop Time 1655   PT Time Calculation (min) 40 min   Activity Tolerance Patient tolerated treatment well   Behavior During Therapy [facility] for tasks assessed/performed      Past Medical History  Diagnosis Date  . Hypertension   . Hyperlipidemia   . Coronary artery disease CARDIOLOGIST- DR Wynonia Lawman-  LAST VISIT 2 WKS AGO-- WILL REQUEST NOTE AND EKG    STRESS TEST -- JULY 2009  . Post PTCA 2001-   X1 STENT TO RCA    AND 1996  . Bladder cancer DX   OCT 2011--  FOLLOWED BY DR WOODRUFF    S/P TURBT,  HIGH GRADE BLADDER CANCER  . Arthritis     SHOULDERS, NECK , RIGHT HIP  . Nocturia     Past Surgical History  Procedure Laterality Date  . Coronary angioplasty  1996  . Coronary angioplasty with stent placement  2001    STENT TO RCA  . Cysto/ bladder bx  10-22-10  . Transurethral resection of bladder tumor  08-27-10    RIGHT ANTERIOR DOME  . Lumbar fusion    . Cystoscopy w/ retrogrades  11/12/2011    Procedure: CYSTOSCOPY WITH RETROGRADE PYELOGRAM;  Surgeon: Molli Hazard, MD;  Location: [facility];  Service: Urology;  Laterality: Bilateral;  . Transurethral resection of bladder tumor  11/12/2011    Procedure: TRANSURETHRAL RESECTION OF BLADDER TUMOR (TURBT);  Surgeon: Molli Hazard, MD;  Location: [facility];  Service: Urology;  Laterality: N/A;  with PK Gyrus c-arm gyrus  digital ureteroscope    There were no vitals filed for this visit.  Visit  Diagnosis:  Left wrist pain - Plan: PT plan of care cert/re-cert  Left hand weakness - Plan: PT plan of care cert/re-cert      Subjective Assessment - 05/09/15 1620    Subjective Pt is a 74 y/o male who presents to OPPT s/p L distal radius and ulnar styloid fx on 03/21/15 with conservative treatment.  Pt reports MD states bones are now healed and pt presents to OPPT with difficulty with ROM and strength   Limitations Lifting;House hold activities   Patient Stated Goals regain normal activity of wrist   Currently in Pain? Yes   Pain Score 2    Pain Location Wrist   Pain Orientation Left   Pain Descriptors / Indicators Aching;Other (Comment)  pulling   Pain Type Acute pain   Pain Onset More than a month ago   Pain Frequency Intermittent   Aggravating Factors  increases to 8/10 with movement   Pain Relieving Factors rest, immobilization            [facility] PT Assessment - 05/09/15 1624    Assessment   Medical Diagnosis L distal radius and ulnar styloid fx   Onset Date/Surgical Date 03/21/15   Hand Dominance Right   Next MD Visit 2 weeks   Prior Therapy none   Precautions  Precautions None   Balance Screen   Has the patient fallen in the past 6 months Yes   How many times? 1-slipped off wet ramp causing wrist injury   Has the patient had a decrease in activity level because of a fear of falling?  No   Is the patient reluctant to leave their home because of a fear of falling?  No   Prior Function   Level of Independence Independent   Vocation Retired   Leisure go on vacation   Cognition   Overall Cognitive Status Within Functional Limits for tasks assessed   Observation/Other Assessments   Focus on Therapeutic Outcomes (FOTO)  38 (62% limited; predicted 37% limited)   AROM   AROM Assessment Site Wrist;Forearm   Right/Left Forearm Right;Left   Right Forearm Pronation 79 Degrees   Right Forearm Supination 74 Degrees   Left Forearm Pronation 70 Degrees   Left Forearm  Supination 37 Degrees   Right/Left Wrist Left   Right Wrist Extension 44 Degrees   Right Wrist Flexion 65 Degrees   Right Wrist Radial Deviation 20 Degrees   Right Wrist Ulnar Deviation 32 Degrees   Left Wrist Extension 29 Degrees   Left Wrist Flexion 9 Degrees   Left Wrist Radial Deviation 12 Degrees   Left Wrist Ulnar Deviation 25 Degrees   Strength   Strength Assessment Site Wrist;Hand;Forearm   Right/Left Forearm Right;Left   Right Forearm Pronation 5/5   Right Forearm Supination 5/5   Left Forearm Pronation 3/5   Left Forearm Supination 3/5   Right/Left Wrist Right;Left   Right Wrist Flexion 5/5   Right Wrist Extension 5/5   Right Wrist Radial Deviation 5/5   Right Wrist Ulnar Deviation 5/5   Left Wrist Flexion 3-/5   Left Wrist Extension 3-/5   Left Wrist Radial Deviation 3-/5   Left Wrist Ulnar Deviation 3-/5   Right Hand Grip (lbs) 94.67  90, 98, 96   Left Hand Grip (lbs) 16.67  19, 15, 16   Palpation   Palpation comment increased swelling in L hand and forearm                   [facility] Adult PT Treatment/Exercise - 05/09/15 1700    Exercises   Exercises Wrist;Hand   Hand Exercises   Other Hand Exercises grip squeeze with towel 10 x 5 sec   Wrist Exercises   Forearm Supination PROM;Left;5 reps   Wrist Flexion PROM;Left;5 reps;Seated   Wrist Extension PROM;Left;5 reps;Seated   Wrist Extension Limitations prayer stretch                PT Education - 05/09/15 1702    Education provided Yes   Education Details HEP   Person(s) Educated Patient   Methods Explanation;Demonstration;Handout   Comprehension Verbalized understanding;Returned demonstration;Need further instruction          PT Short Term Goals - 05/09/15 1705    PT SHORT TERM GOAL #1   Title independent with HEP (06/06/15)   Time 4   Period Weeks   Status New   PT SHORT TERM GOAL #2   Title improve LUE ROM (wrist flex/ext, supination) by at least 10 degrees for improved  motion and function (06/06/15)   Time 4   Period Weeks   Status New   PT SHORT TERM GOAL #3   Title improve L grip strength to at least 20# for improved function (06/06/15)   Time 4   Period Weeks  Status New           PT Long Term Goals - 06/04/2015 1707    PT LONG TERM GOAL #1   Title independent with advanced HEP (07/04/15)   Time 8   Period Weeks   Status New   PT LONG TERM GOAL #2   Title LUE ROM equal to RUE for improved function (07/04/15)   Time 8   Period Weeks   Status New   PT LONG TERM GOAL #3   Title improve L grip strength to at least 50# for improved function (07/04/15)   Time 8   Period Weeks   Status New               Plan - Jun 04, 2015 1703    Clinical Impression Statement Pt presents to OPPT s/p L distal radius and ulnar styloid fx.  Pt demonstrates decreased strength and ROM affecting ADLs and functional use of L hand/wrist.  Will benefit from PT to maximize function.   Pt will benefit from skilled therapeutic intervention in order to improve on the following deficits Pain;Decreased strength;Increased edema;Decreased range of motion   Rehab Potential Good   PT Frequency 2x / week   PT Duration 8 weeks   PT Treatment/Interventions ADLs/Self Care Home Management;Electrical Stimulation;Cryotherapy;Moist Heat;Ultrasound;Therapeutic exercise;Therapeutic activities;Functional mobility training;Patient/family education;Manual techniques;Taping;Passive range of motion   PT Next Visit Plan review HEP; aggressive ROM L wrist/hand, modalities PRN   Consulted and Agree with Plan of Care Patient          G-Codes - 2015-06-04 1709    Functional Assessment Tool Used FOTO 75% limited   Functional Limitation Carrying, moving and handling objects   Carrying, Moving and Handling Objects Current Status (H2197) At least 60 percent but less than 80 percent impaired, limited or restricted   Carrying, Moving and Handling Objects Goal Status (J8832) At least 20 percent but  less than 40 percent impaired, limited or restricted       Problem List There are no active problems to display for this patient.  Laureen Abrahams, PT, DPT 2015/06/04 5:11 PM  [address] [place], [place], [address] Phone: [phone]   Fax:  [phone]

## 2015-05-09 NOTE — Patient Instructions (Signed)
Grip Strengthening (Isometric)   Squeeze washcloth roll. Hold __5__ seconds. Relax __5__ seconds. Repeat __10-15__ times. Do _2-3___ sessions per day.  Copyright  VHI. All rights reserved.   Flexion (Passive)   With palm on table near edge, hold steady with other hand on top. Lower elbow. Hold _20-30___ seconds. Repeat _3-5___ times. Do _2-3___ sessions per day.  Copyright  VHI. All rights reserved.   Composite Extension (Passive Flexor Stretch)   Sitting with elbows on table and palms together, slowly lower wrists toward table until stretch is felt. Be sure to keep palms together throughout stretch. Hold _20-30___ seconds. Relax. Repeat __3-5__ times. Do __2-3__ sessions per day.  Copyright  VHI. All rights reserved.   Supination (Passive)   Keep elbow bent at right angle and held firmly at side. Use other hand to turn forearm until palm faces upward. Hold _20-30___ seconds. Repeat __3-5__ times. Do _2-3___ sessions per day.  Copyright  VHI. All rights reserved.

## 2015-05-15 ENCOUNTER — Ambulatory Visit: Payer: Medicare Other | Admitting: Physical Therapy

## 2015-05-15 ENCOUNTER — Encounter: Payer: Self-pay | Admitting: Physical Therapy

## 2015-05-15 DIAGNOSIS — M25532 Pain in left wrist: Secondary | ICD-10-CM

## 2015-05-15 DIAGNOSIS — R29898 Other symptoms and signs involving the musculoskeletal system: Secondary | ICD-10-CM

## 2015-05-15 NOTE — Therapy (Signed)
[place], [place], [address] Phone: [phone]   Fax:  [phone]  Physical Therapy Treatment  Patient Details  Name: Ricardo [name] MRN: [id number] Date of Birth: 01-26-1941 Referring Provider:  Garald Balding, MD  Encounter Date: 05/15/2015      PT End of Session - 05/15/15 1134    Visit Number 2   Number of Visits 16   Date for PT Re-Evaluation 07/04/15   PT Start Time 1030   PT Stop Time 1130   PT Time Calculation (min) 60 min   Activity Tolerance Patient tolerated treatment well   Behavior During Therapy [facility] for tasks assessed/performed      Past Medical History  Diagnosis Date  . Hypertension   . Hyperlipidemia   . Coronary artery disease CARDIOLOGIST- DR Wynonia Lawman-  LAST VISIT 2 WKS AGO-- WILL REQUEST NOTE AND EKG    STRESS TEST -- JULY 2009  . Post PTCA 2001-   X1 STENT TO RCA    AND 1996  . Bladder cancer DX   OCT 2011--  FOLLOWED BY DR WOODRUFF    S/P TURBT,  HIGH GRADE BLADDER CANCER  . Arthritis     SHOULDERS, NECK , RIGHT HIP  . Nocturia     Past Surgical History  Procedure Laterality Date  . Coronary angioplasty  1996  . Coronary angioplasty with stent placement  2001    STENT TO RCA  . Cysto/ bladder bx  10-22-10  . Transurethral resection of bladder tumor  08-27-10    RIGHT ANTERIOR DOME  . Lumbar fusion    . Cystoscopy w/ retrogrades  11/12/2011    Procedure: CYSTOSCOPY WITH RETROGRADE PYELOGRAM;  Surgeon: Molli Hazard, MD;  Location: [facility];  Service: Urology;  Laterality: Bilateral;  . Transurethral resection of bladder tumor  11/12/2011    Procedure: TRANSURETHRAL RESECTION OF BLADDER TUMOR (TURBT);  Surgeon: Molli Hazard, MD;  Location: [facility];  Service: Urology;  Laterality: N/A;  with PK Gyrus c-arm gyrus  digital ureteroscope    There were no vitals filed for this visit.  Visit Diagnosis:   Left wrist pain  Left hand weakness      Subjective Assessment - 05/15/15 1030    Subjective Things are pretty good, moving around a little bit better than last week.  Pt states he's been following his HEP.    Limitations Lifting;House hold activities  Rolling up a car window is really tough   Patient Stated Goals regain normal activity of wrist   Currently in Pain? Yes   Pain Score 7    Pain Location Wrist   Pain Orientation Left   Pain Descriptors / Indicators Aching  pulling   Pain Type Acute pain   Pain Onset More than a month ago   Pain Frequency Intermittent   Aggravating Factors  movement increases pain, dressing, cooking, rolling up a window, morning stiffness   Pain Relieving Factors medications            [facility] PT Assessment - 05/15/15 0001    AROM   Left Forearm Supination 54 Degrees   Left Wrist Extension 46 Degrees   Left Wrist Flexion 35 Degrees   Strength   Left Hand Grip (lbs) 43                     [facility] Adult PT Treatment/Exercise - 05/15/15 0001  Exercises   Exercises Wrist;Hand   Hand Exercises   Rubberbands Finger web 2 sets 10 reps, finger squeeze and abduction   Other Hand Exercises grip squeeze w/ purple egg, 2 sets 10 reps   Other Hand Exercises Fist w/OP and wrist flex, 2 sets, 10 reps   Wrist Exercises   Forearm Supination PROM;Left;5 reps   Bar Weights/Barbell (Forearm Supination) 2 lbs;Other (comment)  hammer pron/sup, gripping at base of black   Bar Weights/Barbell (Forearm Pronation) 2 lbs  Hammer pron/sup, gripping at base of black   Wrist Flexion PROM;Left;5 reps;Seated   Wrist Extension PROM;Left;5 reps;Seated   Wrist Extension Limitations prayer stretch   Wrist Radial Deviation AROM;10 reps   Wrist Ulnar Deviation AROM;10 reps   Other wrist exercises Wrist ext-AROM 2 x 10                PT Education - 05/15/15 1133    Education provided Yes   Education Details HEP, gave pt handout with theraband  flexion/ext exercises   Person(s) Educated Patient   Methods Explanation;Demonstration;Handout   Comprehension Verbalized understanding;Returned demonstration          PT Short Term Goals - 05/09/15 1705    PT SHORT TERM GOAL #1   Title independent with HEP (06/06/15)   Time 4   Period Weeks   Status New   PT SHORT TERM GOAL #2   Title improve LUE ROM (wrist flex/ext, supination) by at least 10 degrees for improved motion and function (06/06/15)   Time 4   Period Weeks   Status New   PT SHORT TERM GOAL #3   Title improve L grip strength to at least 20# for improved function (06/06/15)   Time 4   Period Weeks   Status New           PT Long Term Goals - 05/09/15 1707    PT LONG TERM GOAL #1   Title independent with advanced HEP (07/04/15)   Time 8   Period Weeks   Status New   PT LONG TERM GOAL #2   Title LUE ROM equal to RUE for improved function (07/04/15)   Time 8   Period Weeks   Status New   PT LONG TERM GOAL #3   Title improve L grip strength to at least 50# for improved function (07/04/15)   Time 8   Period Weeks   Status New               Plan - 05/15/15 1135    Clinical Impression Statement Pt presents to OPPT s/p L distal radius and ulnar styloid fx with improved ROM but still has pain and decreased functional use of L hand/wrist.  Pt will benefit from continued PT to maximize function and decrease pain.   Pt will benefit from skilled therapeutic intervention in order to improve on the following deficits Pain;Decreased strength;Increased edema;Decreased range of motion   Rehab Potential Good   PT Frequency 2x / week   PT Duration 8 weeks   PT Treatment/Interventions ADLs/Self Care Home Management;Electrical Stimulation;Cryotherapy;Moist Heat;Ultrasound;Therapeutic exercise;Therapeutic activities;Functional mobility training;Patient/family education;Manual techniques;Taping;Passive range of motion   PT Next Visit Plan review HEP; aggressive ROM L  wrist/hand, modalities PRN   Consulted and Agree with Plan of Care Patient        Problem List There are no active problems to display for this patient.   Sumner Boast., PT 05/15/2015, 4:22 PM  [place]  [place] [place], [place], [address] Phone: [phone]   Fax:  [phone]

## 2015-05-17 ENCOUNTER — Encounter: Payer: Self-pay | Admitting: Physical Therapy

## 2015-05-17 ENCOUNTER — Ambulatory Visit: Payer: Medicare Other | Admitting: Physical Therapy

## 2015-05-17 DIAGNOSIS — M25532 Pain in left wrist: Secondary | ICD-10-CM

## 2015-05-17 DIAGNOSIS — R29898 Other symptoms and signs involving the musculoskeletal system: Secondary | ICD-10-CM

## 2015-05-17 NOTE — Therapy (Signed)
[place], [place], [address] Phone: [phone]   Fax:  [phone]  Physical Therapy Treatment  Patient Details  Name: Ricardo [name] MRN: [id number] Date of Birth: 12-24-1940 Referring Provider:  Garald Balding, MD  Encounter Date: 05/17/2015      PT End of Session - 05/17/15 1714    Visit Number 3   Number of Visits 16   Date for PT Re-Evaluation 07/04/15   PT Start Time 0354   PT Stop Time 1705   PT Time Calculation (min) 50 min   Activity Tolerance Patient tolerated treatment well   Behavior During Therapy [facility] for tasks assessed/performed      Past Medical History  Diagnosis Date  . Hypertension   . Hyperlipidemia   . Coronary artery disease CARDIOLOGIST- DR Wynonia Lawman-  LAST VISIT 2 WKS AGO-- WILL REQUEST NOTE AND EKG    STRESS TEST -- JULY 2009  . Post PTCA 2001-   X1 STENT TO RCA    AND 1996  . Bladder cancer DX   OCT 2011--  FOLLOWED BY DR WOODRUFF    S/P TURBT,  HIGH GRADE BLADDER CANCER  . Arthritis     SHOULDERS, NECK , RIGHT HIP  . Nocturia     Past Surgical History  Procedure Laterality Date  . Coronary angioplasty  1996  . Coronary angioplasty with stent placement  2001    STENT TO RCA  . Cysto/ bladder bx  10-22-10  . Transurethral resection of bladder tumor  08-27-10    RIGHT ANTERIOR DOME  . Lumbar fusion    . Cystoscopy w/ retrogrades  11/12/2011    Procedure: CYSTOSCOPY WITH RETROGRADE PYELOGRAM;  Surgeon: Molli Hazard, MD;  Location: [facility];  Service: Urology;  Laterality: Bilateral;  . Transurethral resection of bladder tumor  11/12/2011    Procedure: TRANSURETHRAL RESECTION OF BLADDER TUMOR (TURBT);  Surgeon: Molli Hazard, MD;  Location: [facility];  Service: Urology;  Laterality: N/A;  with PK Gyrus c-arm gyrus  digital ureteroscope    There were no vitals filed for this visit.  Visit Diagnosis:   Left wrist pain  Left hand weakness      Subjective Assessment - 05/17/15 1619    Subjective Pt states his wrist is hurting but still moving and getting around.  Pt states he's been following his HEP   Limitations Lifting;House hold activities   Patient Stated Goals regain normal activity of wrist   Currently in Pain? Yes   Pain Score 4    Pain Location Wrist   Pain Orientation Left   Pain Descriptors / Indicators Shooting;Discomfort   Pain Type Acute pain   Pain Onset More than a month ago   Pain Frequency Constant   Aggravating Factors  moving, doing ADL's, rolling up a window, dressing, morning stiffness   Pain Relieving Factors rest                         [facility] Adult PT Treatment/Exercise - 05/17/15 0001    Elbow Exercises   Forearm Supination --  2 sets   Wrist Flexion --  2 sets   Wrist Extension --  2 sets   Other elbow exercises bicep curls 2# 3 x 15, accentuating wrist flex, ext   Lumbar Exercises: Aerobic   UBE (Upper Arm Bike) 6 min forward/backward L9   Hand Exercises  Other Hand Exercises grip squeeze w/ purple egg, 3 sets 10 reps, in neutral, supination, pronation   Other Hand Exercises Velcroboard-key-5x back and forth, stick roller 5 x back and forth    Wrist Exercises   Bar Weights/Barbell (Forearm Supination) 2 lbs  2.5 lb cuff wt   Bar Weights/Barbell (Forearm Pronation) 2 lbs  2.5lb cuff wt   Other wrist exercises Bosu ball wt shifts forward/backward/side to side 2 x 20   Manual Therapy   Manual Therapy Joint mobilization;Passive ROM   Manual therapy comments carpals, metacarpals   Passive ROM flex/ext/UD/RD  2 x 10 reps each                PT Education - 05/17/15 1719    Education provided Yes   Education Details Instructed pt to continue with HEP, added soup can flexion/ext over table with sustained lengthening   Person(s) Educated Patient   Methods Explanation;Demonstration   Comprehension Verbalized  understanding          PT Short Term Goals - 05/09/15 1705    PT SHORT TERM GOAL #1   Title independent with HEP (06/06/15)   Time 4   Period Weeks   Status New   PT SHORT TERM GOAL #2   Title improve LUE ROM (wrist flex/ext, supination) by at least 10 degrees for improved motion and function (06/06/15)   Time 4   Period Weeks   Status New   PT SHORT TERM GOAL #3   Title improve L grip strength to at least 20# for improved function (06/06/15)   Time 4   Period Weeks   Status New           PT Long Term Goals - 05/09/15 1707    PT LONG TERM GOAL #1   Title independent with advanced HEP (07/04/15)   Time 8   Period Weeks   Status New   PT LONG TERM GOAL #2   Title LUE ROM equal to RUE for improved function (07/04/15)   Time 8   Period Weeks   Status New   PT LONG TERM GOAL #3   Title improve L grip strength to at least 50# for improved function (07/04/15)   Time 8   Period Weeks   Status New               Plan - 05/17/15 1714    Clinical Impression Statement Pt is continuing with HEP and making gains in all motions of the wrist.  Pt still has pain and decreased use with daily activities    Pt will benefit from skilled therapeutic intervention in order to improve on the following deficits Pain;Decreased strength;Increased edema;Decreased range of motion;Impaired UE functional use   Rehab Potential Good   PT Frequency 2x / week   PT Duration 8 weeks   PT Treatment/Interventions ADLs/Self Care Home Management;Electrical Stimulation;Cryotherapy;Moist Heat;Ultrasound;Therapeutic exercise;Therapeutic activities;Functional mobility training;Patient/family education;Manual techniques;Taping;Passive range of motion   PT Next Visit Plan continue with ROM/joint mobilizations and strengthening, modalities PRN   Consulted and Agree with Plan of Care Patient        Problem List There are no active problems to display for this patient.   PAYSEUR,ANGIE PTA 05/17/2015,  5:31 PM  [place] [address] [place], [place], [address] Phone: [phone]   Fax:  [phone]

## 2015-05-22 ENCOUNTER — Ambulatory Visit: Payer: Medicare Other | Attending: Orthopaedic Surgery | Admitting: Physical Therapy

## 2015-05-22 ENCOUNTER — Encounter: Payer: Self-pay | Admitting: Physical Therapy

## 2015-05-22 DIAGNOSIS — R29898 Other symptoms and signs involving the musculoskeletal system: Secondary | ICD-10-CM

## 2015-05-22 DIAGNOSIS — M25532 Pain in left wrist: Secondary | ICD-10-CM

## 2015-05-22 DIAGNOSIS — M6289 Other specified disorders of muscle: Secondary | ICD-10-CM | POA: Insufficient documentation

## 2015-05-22 NOTE — Therapy (Signed)
[place], [place], [address] Phone: [phone]   Fax:  [phone]  Physical Therapy Treatment  Patient Details  Name: Ricardo [name] MRN: [id number] Date of Birth: 15-May-1941 Referring Provider:  Garald Balding, MD  Encounter Date: 05/22/2015      PT End of Session - 05/22/15 1241    Visit Number 4   Number of Visits 16   Date for PT Re-Evaluation 07/04/15   PT Start Time 1137   PT Stop Time 1232   PT Time Calculation (min) 55 min   Activity Tolerance Patient tolerated treatment well   Behavior During Therapy [facility] for tasks assessed/performed      Past Medical History  Diagnosis Date  . Hypertension   . Hyperlipidemia   . Coronary artery disease CARDIOLOGIST- DR Wynonia Lawman-  LAST VISIT 2 WKS AGO-- WILL REQUEST NOTE AND EKG    STRESS TEST -- JULY 2009  . Post PTCA 2001-   X1 STENT TO RCA    AND 1996  . Bladder cancer DX   OCT 2011--  FOLLOWED BY DR WOODRUFF    S/P TURBT,  HIGH GRADE BLADDER CANCER  . Arthritis     SHOULDERS, NECK , RIGHT HIP  . Nocturia     Past Surgical History  Procedure Laterality Date  . Coronary angioplasty  1996  . Coronary angioplasty with stent placement  2001    STENT TO RCA  . Cysto/ bladder bx  10-22-10  . Transurethral resection of bladder tumor  08-27-10    RIGHT ANTERIOR DOME  . Lumbar fusion    . Cystoscopy w/ retrogrades  11/12/2011    Procedure: CYSTOSCOPY WITH RETROGRADE PYELOGRAM;  Surgeon: Molli Hazard, MD;  Location: [facility];  Service: Urology;  Laterality: Bilateral;  . Transurethral resection of bladder tumor  11/12/2011    Procedure: TRANSURETHRAL RESECTION OF BLADDER TUMOR (TURBT);  Surgeon: Molli Hazard, MD;  Location: [facility];  Service: Urology;  Laterality: N/A;  with PK Gyrus c-arm gyrus  digital ureteroscope    There were no vitals filed for this visit.  Visit Diagnosis:   Left wrist pain  Left hand weakness      Subjective Assessment - 05/22/15 1137    Subjective Pt states he's ok.  His wrist and arm are hurting and thinks it is due to a squeeze balloon that he bought to work on grip strength.  Had a good weekend but didn't do much.    Limitations Lifting;House hold activities   Patient Stated Goals regain normal activity of wrist   Currently in Pain? Yes   Pain Score 6    Pain Location Wrist   Pain Orientation Left   Pain Descriptors / Indicators Discomfort;Aching;Shooting   Pain Type Acute pain   Pain Onset More than a month ago   Pain Frequency Constant   Aggravating Factors  moving, using his wrist   Pain Relieving Factors rest            [facility] PT Assessment - 05/22/15 0001    AROM   Left Forearm Pronation 97 Degrees   Left Forearm Supination 88 Degrees   Left Wrist Extension 55 Degrees   Left Wrist Flexion 49 Degrees   Strength   Left Hand Grip (lbs) 52                     [facility] Adult PT Treatment/Exercise -  05/22/15 0001    Lumbar Exercises: Aerobic   UBE (Upper Arm Bike) 10 min foward/backward L10   Hand Exercises   Other Hand Exercises Velcroboard-key-5x back and forth, stick roller 5 x back and forth    Wrist Exercises   Bar Weights/Barbell (Forearm Supination) 4 lbs  2 setsm 15 reps   Bar Weights/Barbell (Forearm Pronation) 4 lbs  2 sets, 15 reps   Theraband Level (Radial Deviation) Level 2 (Red)  2 sets, 15 reps   Theraband Level (Ulnar Deviation) Level 2 (Red)  2 sets, 15 reps   Other wrist exercises Bosu ball wt shifts forward/backward/side to side 2 x 20   Other wrist exercises flex/ext-red theraband 2 x 15 reps   Manual Therapy   Manual Therapy Joint mobilization;Passive ROM   Manual therapy comments carpals, metacarpals   Passive ROM flex/ext/UD/RD  w/OP                PT Education - 05/22/15 1239    Education provided Yes   Education Details continued instruction with HEP using  theraband in all wrist motions, icing, massage to decrease swelling   Person(s) Educated Patient   Methods Explanation;Demonstration;Tactile cues;Verbal cues   Comprehension Verbalized understanding;Returned demonstration;Need further instruction          PT Short Term Goals - 05/09/15 1705    PT SHORT TERM GOAL #1   Title independent with HEP (06/06/15)   Time 4   Period Weeks   Status New   PT SHORT TERM GOAL #2   Title improve LUE ROM (wrist flex/ext, supination) by at least 10 degrees for improved motion and function (06/06/15)   Time 4   Period Weeks   Status New   PT SHORT TERM GOAL #3   Title improve L grip strength to at least 20# for improved function (06/06/15)   Time 4   Period Weeks   Status New           PT Long Term Goals - 05/09/15 1707    PT LONG TERM GOAL #1   Title independent with advanced HEP (07/04/15)   Time 8   Period Weeks   Status New   PT LONG TERM GOAL #2   Title LUE ROM equal to RUE for improved function (07/04/15)   Time 8   Period Weeks   Status New   PT LONG TERM GOAL #3   Title improve L grip strength to at least 50# for improved function (07/04/15)   Time 8   Period Weeks   Status New               Plan - 05/22/15 1241    Clinical Impression Statement Pt is continuing to make gains in ROM and strength.  Pt complains of pain during gripping within 3rd/4th mets.  Pt will see physician tomorrow for check-up on wrist, measurements were taken today to give to physician.   Pt will benefit from skilled therapeutic intervention in order to improve on the following deficits Pain;Decreased strength;Increased edema;Decreased range of motion;Impaired UE functional use   Rehab Potential Good   PT Frequency 2x / week   PT Duration 8 weeks   PT Treatment/Interventions ADLs/Self Care Home Management;Electrical Stimulation;Cryotherapy;Moist Heat;Ultrasound;Therapeutic exercise;Therapeutic activities;Functional mobility training;Patient/family  education;Manual techniques;Taping;Passive range of motion   PT Next Visit Plan continue with ROM/joint mobilizations and strengthening, modalities PRN   Consulted and Agree with Plan of Care Patient        Problem List There are  no active problems to display for this patient.   Scot Jun, PTA 05/22/2015, 2:49 PM  [place] [address] [place], [place], [address] Phone: [phone]   Fax:  [phone]

## 2015-05-24 ENCOUNTER — Ambulatory Visit: Payer: Medicare Other | Admitting: Physical Therapy

## 2015-05-24 ENCOUNTER — Encounter: Payer: Self-pay | Admitting: Physical Therapy

## 2015-05-24 DIAGNOSIS — M25532 Pain in left wrist: Secondary | ICD-10-CM

## 2015-05-24 DIAGNOSIS — R29898 Other symptoms and signs involving the musculoskeletal system: Secondary | ICD-10-CM

## 2015-05-24 NOTE — Therapy (Signed)
[place], [place], [address] Phone: [phone]   Fax:  [phone]  Physical Therapy Treatment  Patient Details  Name: Ricardo [name] MRN: [id number] Date of Birth: [birth year] Referring Provider:  Garald Balding, MD  Encounter Date: 05/24/2015      PT End of Session - 05/24/15 1233    Visit Number 5   PT Start Time 1761   PT Stop Time 1233   PT Time Calculation (min) 45 min   Activity Tolerance Patient tolerated treatment well   Behavior During Therapy [facility] for tasks assessed/performed      Past Medical History  Diagnosis Date  . Hypertension   . Hyperlipidemia   . Coronary artery disease CARDIOLOGIST- DR Wynonia Lawman-  LAST VISIT 2 WKS AGO-- WILL REQUEST NOTE AND EKG    STRESS TEST -- JULY 2009  . Post PTCA 2001-   X1 STENT TO RCA    AND 1996  . Bladder cancer DX   OCT 2011--  FOLLOWED BY DR WOODRUFF    S/P TURBT,  HIGH GRADE BLADDER CANCER  . Arthritis     SHOULDERS, NECK , RIGHT HIP  . Nocturia     Past Surgical History  Procedure Laterality Date  . Coronary angioplasty  1996  . Coronary angioplasty with stent placement  2001    STENT TO RCA  . Cysto/ bladder bx  10-22-10  . Transurethral resection of bladder tumor  08-27-10    RIGHT ANTERIOR DOME  . Lumbar fusion    . Cystoscopy w/ retrogrades  11/12/2011    Procedure: CYSTOSCOPY WITH RETROGRADE PYELOGRAM;  Surgeon: Molli Hazard, MD;  Location: [facility];  Service: Urology;  Laterality: Bilateral;  . Transurethral resection of bladder tumor  11/12/2011    Procedure: TRANSURETHRAL RESECTION OF BLADDER TUMOR (TURBT);  Surgeon: Molli Hazard, MD;  [facility];  Service: Urology;  Laterality: N/A;  with PK Gyrus c-arm gyrus  digital ureteroscope    There were no vitals filed for this visit.  Visit Diagnosis:  Left wrist pain  Left hand weakness      Subjective  Assessment - 05/24/15 1149    Subjective Pt reports that he is ok with no problems. Pt had Dr appointment yesterday and reports that everything looks good.    Limitations Lifting;House hold activities   Patient Stated Goals regain normal activity of wrist   Currently in Pain? Yes   Pain Score 3    Pain Location Wrist   Pain Orientation Left   Pain Descriptors / Indicators Aching;Discomfort   Pain Type Acute pain   Pain Onset More than a month ago                         [facility] Adult PT Treatment/Exercise - 05/24/15 0001    Lumbar Exercises: Aerobic   UBE (Upper Arm Bike) 8 min constant work 30 x8 min    Hand Exercises   Other Hand Exercises Velcroboard-key-8x back and forth, stick roller 5 x back and forth    Wrist Exercises   Bar Weights/Barbell (Forearm Supination) 4 lbs  2 sets 15 reps   Bar Weights/Barbell (Forearm Pronation) 4 lbs  2 sets 15 reps    Theraband Level (Radial Deviation) Level 1 (Yellow)  2 sets 15 reps    Theraband Level (Ulnar Deviation) Level 2 (Red)  2 sets 15 reps  Other wrist exercises Physo ball wall presses LUE only 15  reps 2 sets, rebound all toss with LUE 15 reps over and underhand catch 2 sets    Other wrist exercises flex/ext-red theraband 2 x 15 reps   Manual Therapy   Manual Therapy Joint mobilization;Passive ROM   Manual therapy comments carpals, metacarpals   Passive ROM flex/ext/UD/RD                  PT Short Term Goals - 05/24/15 1235    PT SHORT TERM GOAL #1   Title independent with HEP (06/06/15)   Status Achieved   PT SHORT TERM GOAL #2   Title improve LUE ROM (wrist flex/ext, supination) by at least 10 degrees for improved motion and function (06/06/15)   Status Partially Met           PT Long Term Goals - 05/09/15 1707    PT LONG TERM GOAL #1   Title independent with advanced HEP (07/04/15)   Time 8   Period Weeks   Status New   PT LONG TERM GOAL #2   Title LUE ROM equal to RUE for improved  function (07/04/15)   Time 8   Period Weeks   Status New   PT LONG TERM GOAL #3   Title improve L grip strength to at least 50# for improved function (07/04/15)   Time 8   Period Weeks   Status New               Plan - 05/24/15 1234    Clinical Impression Statement Pt is continues to increase strength and ROM. Pt does report increased pain with joint mobs. Pt continues to report that lifting objects is an issue.    Pt will benefit from skilled therapeutic intervention in order to improve on the following deficits Pain;Decreased strength;Increased edema;Decreased range of motion;Impaired UE functional use   Rehab Potential Good   PT Frequency 2x / week   PT Treatment/Interventions ADLs/Self Care Home Management;Therapeutic exercise;Therapeutic activities;Functional mobility training;Patient/family education;Manual techniques;Taping;Passive range of motion   PT Next Visit Plan continue with ROM/joint mobilizations and strengthening, modalities PRN        Problem List There are no active problems to display for this patient.    Scot Jun, PTA 05/24/2015, 12:36 PM  [place] [address] [place], [place], [address] Phone: [phone]   Fax:  [phone]

## 2015-05-29 ENCOUNTER — Encounter: Payer: Self-pay | Admitting: Physical Therapy

## 2015-05-29 ENCOUNTER — Ambulatory Visit: Payer: Medicare Other | Admitting: Physical Therapy

## 2015-05-29 DIAGNOSIS — R29898 Other symptoms and signs involving the musculoskeletal system: Secondary | ICD-10-CM

## 2015-05-29 DIAGNOSIS — M25532 Pain in left wrist: Secondary | ICD-10-CM

## 2015-05-29 NOTE — Therapy (Signed)
[place], [place], [address] Phone: [phone]   Fax:  [phone]  Physical Therapy Treatment  Patient Details  Name: Ricardo [name] MRN: [id number] Date of Birth: July 09, 1941 Referring Provider:  Garald Balding, MD  Encounter Date: 05/29/2015      PT End of Session - 05/29/15 1223    Visit Number 6   Number of Visits 16   Date for PT Re-Evaluation 07/04/15   PT Start Time 1140   PT Stop Time 1223   PT Time Calculation (min) 43 min   Activity Tolerance Patient tolerated treatment well   Behavior During Therapy [facility] for tasks assessed/performed      Past Medical History  Diagnosis Date  . Hypertension   . Hyperlipidemia   . Coronary artery disease CARDIOLOGIST- DR Wynonia Lawman-  LAST VISIT 2 WKS AGO-- WILL REQUEST NOTE AND EKG    STRESS TEST -- JULY 2009  . Post PTCA 2001-   X1 STENT TO RCA    AND 1996  . Bladder cancer DX   OCT 2011--  FOLLOWED BY DR WOODRUFF    S/P TURBT,  HIGH GRADE BLADDER CANCER  . Arthritis     SHOULDERS, NECK , RIGHT HIP  . Nocturia     Past Surgical History  Procedure Laterality Date  . Coronary angioplasty  1996  . Coronary angioplasty with stent placement  2001    STENT TO RCA  . Cysto/ bladder bx  10-22-10  . Transurethral resection of bladder tumor  08-27-10    RIGHT ANTERIOR DOME  . Lumbar fusion    . Cystoscopy w/ retrogrades  11/12/2011    Procedure: CYSTOSCOPY WITH RETROGRADE PYELOGRAM;  Surgeon: Molli Hazard, MD;  Location: [facility];  Service: Urology;  Laterality: Bilateral;  . Transurethral resection of bladder tumor  11/12/2011    Procedure: TRANSURETHRAL RESECTION OF BLADDER TUMOR (TURBT);  Surgeon: Molli Hazard, MD;  Location: [facility];  Service: Urology;  Laterality: N/A;  with PK Gyrus c-arm gyrus  digital ureteroscope    There were no vitals filed for this visit.  Visit Diagnosis:   Left wrist pain  Left hand weakness      Subjective Assessment - 05/29/15 1143    Subjective Pt reports that he is doing so. Pt does repot that it is easier to grip objects but he cant squeeze his fingers all the way closed.   Limitations Lifting;House hold activities   Patient Stated Goals regain normal activity of wrist   Currently in Pain? No/denies   Pain Score 0-No pain   Pain Location Wrist   Pain Orientation Left   Pain Descriptors / Indicators Discomfort   Pain Type Acute pain   Pain Onset More than a month ago   Pain Frequency Occasional   Aggravating Factors  moving, using his wrist    Pain Relieving Factors rest                          [facility] Adult PT Treatment/Exercise - 05/29/15 0001    Lumbar Exercises: Aerobic   UBE (Upper Arm Bike) 8 min constant work 30 x8 min    Hand Exercises   Other Hand Exercises Velcroboard-key-10x back and forth, stick roller 8 x back and forth    Wrist Exercises   Bar Weights/Barbell (Forearm Supination) 5 lbs   Bar Weights/Barbell (Forearm Pronation) 5 lbs  Wrist Radial Deviation AROM;10 reps  2 sets   Theraband Level (Radial Deviation) Level 3 (Green)   Wrist Ulnar Deviation AROM;10 reps  2 sets    Theraband Level (Ulnar Deviation) Level 3 (Green)   Other wrist exercises Physo ball wall presses LUE only 15  reps 2 sets, rebound all toss with LUE 15 reps over and underhand catch 2 sets    Other wrist exercises flex/ext- green  theraband 2 x 15 reps   Manual Therapy   Manual Therapy Joint mobilization;Passive ROM   Manual therapy comments carpals, metacarpals   Passive ROM flex/ext/UD/RD                  PT Short Term Goals - 05/29/15 1228    PT SHORT TERM GOAL #3   Title improve L grip strength to at least 20# for improved function (06/06/15)   Status Partially Met           PT Long Term Goals - 05/29/15 1228    PT LONG TERM GOAL #1   Status On-going   PT LONG TERM GOAL #3   Title improve  L grip strength to at least 50# for improved function (07/04/15)   Status On-going               Plan - 05/29/15 1223    Clinical Impression Statement Pt reports improvement with his grip. Pt tolerated treatment well and reports being compliant with HEP. Pt does continues to report pian with joint mobes. Pt able to tolerate  therapeutic interventions with increase  intensities.   Pt will benefit from skilled therapeutic intervention in order to improve on the following deficits Pain;Decreased strength;Increased edema;Decreased range of motion;Impaired UE functional use   Rehab Potential Good   PT Frequency 2x / week   PT Duration 8 weeks   PT Treatment/Interventions ADLs/Self Care Home Management;Therapeutic exercise;Therapeutic activities;Functional mobility training;Patient/family education;Manual techniques;Taping;Passive range of motion   PT Next Visit Plan continue with ROM/joint mobilizations and strengthening, modalities PRN        Problem List There are no active problems to display for this patient.   Scot Jun, PTA 05/29/2015, 12:30 PM  [place], [place], [address] Phone: [phone]   Fax:  [phone]

## 2015-05-31 ENCOUNTER — Encounter: Payer: Medicare Other | Admitting: Physical Therapy

## 2015-05-31 ENCOUNTER — Ambulatory Visit: Payer: Medicare Other | Admitting: Physical Therapy

## 2015-05-31 DIAGNOSIS — M25532 Pain in left wrist: Secondary | ICD-10-CM | POA: Diagnosis not present

## 2015-05-31 DIAGNOSIS — R29898 Other symptoms and signs involving the musculoskeletal system: Secondary | ICD-10-CM

## 2015-05-31 NOTE — Therapy (Signed)
[place], [place], [address] Phone: [phone]   Fax:  [phone]  Physical Therapy Treatment  Patient Details  Name: Ricardo [name] MRN: [id number] Date of Birth: Feb 15, 1941 Referring Provider:  Garald Balding, MD  Encounter Date: 05/31/2015      PT End of Session - 05/31/15 1104    Visit Number 7   Number of Visits 16   Date for PT Re-Evaluation 07/04/15   PT Start Time 1100   PT Stop Time 1145   PT Time Calculation (min) 45 min   Activity Tolerance Patient tolerated treatment well   Behavior During Therapy [facility] for tasks assessed/performed      Past Medical History  Diagnosis Date  . Hypertension   . Hyperlipidemia   . Coronary artery disease CARDIOLOGIST- DR Wynonia Lawman-  LAST VISIT 2 WKS AGO-- WILL REQUEST NOTE AND EKG    STRESS TEST -- JULY 2009  . Post PTCA 2001-   X1 STENT TO RCA    AND 1996  . Bladder cancer DX   OCT 2011--  FOLLOWED BY DR WOODRUFF    S/P TURBT,  HIGH GRADE BLADDER CANCER  . Arthritis     SHOULDERS, NECK , RIGHT HIP  . Nocturia     Past Surgical History  Procedure Laterality Date  . Coronary angioplasty  1996  . Coronary angioplasty with stent placement  2001    STENT TO RCA  . Cysto/ bladder bx  10-22-10  . Transurethral resection of bladder tumor  08-27-10    RIGHT ANTERIOR DOME  . Lumbar fusion    . Cystoscopy w/ retrogrades  11/12/2011    Procedure: CYSTOSCOPY WITH RETROGRADE PYELOGRAM;  Surgeon: Molli Hazard, MD;  Location: [facility];  Service: Urology;  Laterality: Bilateral;  . Transurethral resection of bladder tumor  11/12/2011    Procedure: TRANSURETHRAL RESECTION OF BLADDER TUMOR (TURBT);  Surgeon: Molli Hazard, MD;  Location: [facility];  Service: Urology;  Laterality: N/A;  with PK Gyrus c-arm gyrus  digital ureteroscope    There were no vitals filed for this visit.  Visit Diagnosis:   Left wrist pain  Left hand weakness      Subjective Assessment - 05/31/15 1105    Subjective Pt states that he really worked his wrist yesterday with exercises using a 5LB weight and has increased pain and swelling today.     Currently in Pain? Yes   Pain Score 7    Pain Location Wrist   Pain Orientation Left   Pain Descriptors / Indicators Aching;Discomfort   Pain Type Acute pain   Pain Onset Yesterday   Aggravating Factors  moving, using the wrist, doing exercises with increased weight   Pain Relieving Factors rest, massage                         [facility] Adult PT Treatment/Exercise - 05/31/15 0001    Lumbar Exercises: Aerobic   UBE (Upper Arm Bike) 8 min Level 10   Hand Exercises   Other Hand Exercises wall slides with sustained hold in end range for flex/ext, spider webbing with fingers against wall   Wrist Exercises   Other wrist exercises Physo ball wall presses LUE only 15  reps 2 sets   Other wrist exercises Flexion/ext with 8lb weight sustained hold with manual overpressure and slight contract relax   Manual Therapy   Manual  Therapy Joint mobilization;Passive ROM   Manual therapy comments carpals, metacarpals, RCJ, RUJ   Soft tissue mobilization performed swelling STM with elbow on table flexed at 90 degrees   Passive ROM flex/ext/UD/RD                PT Education - 05/31/15 1159    Education provided Yes   Education Details Continue with  HEP but instructed not to overdo the exercises.  Added in spider webbing on wall with hands and fingers.  Ice when finished with HEP   Person(s) Educated Patient   Methods Explanation;Demonstration;Tactile cues;Verbal cues   Comprehension Verbalized understanding;Returned demonstration;Verbal cues required;Tactile cues required;Need further instruction          PT Short Term Goals - 05/29/15 1228    PT SHORT TERM GOAL #3   Title improve L grip strength to at least 20# for improved function (06/06/15)    Status Partially Met           PT Long Term Goals - 05/29/15 1228    PT LONG TERM GOAL #1   Status On-going   PT LONG TERM GOAL #3   Title improve L grip strength to at least 50# for improved function (07/04/15)   Status On-going               Plan - 05/31/15 1153    Clinical Impression Statement Pt reported increased pain and swelling today after performing HEP yesterday x 3. Performed STM massage encouraging decreased swelling, joint mobilizations for flex/ext and sup/pron, pt able to perform exercises and stated he is still having palmer tendinous pain extending into digits.     Pt will benefit from skilled therapeutic intervention in order to improve on the following deficits Pain;Decreased strength;Increased edema;Decreased range of motion;Impaired UE functional use   Rehab Potential Good   PT Frequency 2x / week   PT Duration 8 weeks   PT Treatment/Interventions ADLs/Self Care Home Management;Therapeutic exercise;Therapeutic activities;Functional mobility training;Patient/family education;Manual techniques;Taping;Passive range of motion   PT Next Visit Plan continue with ROM/joint mobilizations and strengthening, modalities PRN   PT Home Exercise Plan Added in spider webbing with fingers on the wall    Consulted and Agree with Plan of Care Patient        Problem List There are no active problems to display for this patient.   Raetta Agostinelli,ANGIE PTA 05/31/2015, 1:49 PM  [place], [place], [address] Phone: [phone]   Fax:  [phone]

## 2015-06-05 ENCOUNTER — Encounter: Payer: Self-pay | Admitting: Physical Therapy

## 2015-06-05 ENCOUNTER — Ambulatory Visit: Payer: Medicare Other | Admitting: Physical Therapy

## 2015-06-05 DIAGNOSIS — M25532 Pain in left wrist: Secondary | ICD-10-CM

## 2015-06-05 DIAGNOSIS — R29898 Other symptoms and signs involving the musculoskeletal system: Secondary | ICD-10-CM

## 2015-06-05 NOTE — Therapy (Signed)
[place], [place], [address] Phone: [phone]   Fax:  [phone]  Physical Therapy Treatment  Patient Details  Name: Ricardo [name] MRN: [id number] Date of Birth: 1941-02-08 Referring Provider:  Garald Balding, MD  Encounter Date: 06/05/2015      PT End of Session - 06/05/15 1144    Visit Number 8   Number of Visits 16   Date for PT Re-Evaluation 07/04/15   PT Start Time 5597   PT Stop Time 1222   PT Time Calculation (min) 54 min      Past Medical History  Diagnosis Date  . Hypertension   . Hyperlipidemia   . Coronary artery disease CARDIOLOGIST- DR Wynonia Lawman-  LAST VISIT 2 WKS AGO-- WILL REQUEST NOTE AND EKG    STRESS TEST -- JULY 2009  . Post PTCA 2001-   X1 STENT TO RCA    AND 1996  . Bladder cancer DX   OCT 2011--  FOLLOWED BY DR WOODRUFF    S/P TURBT,  HIGH GRADE BLADDER CANCER  . Arthritis     SHOULDERS, NECK , RIGHT HIP  . Nocturia     Past Surgical History  Procedure Laterality Date  . Coronary angioplasty  1996  . Coronary angioplasty with stent placement  2001    STENT TO RCA  . Cysto/ bladder bx  10-22-10  . Transurethral resection of bladder tumor  08-27-10    RIGHT ANTERIOR DOME  . Lumbar fusion    . Cystoscopy w/ retrogrades  11/12/2011    Procedure: CYSTOSCOPY WITH RETROGRADE PYELOGRAM;  Surgeon: Molli Hazard, MD;  Location: [facility];  Service: Urology;  Laterality: Bilateral;  . Transurethral resection of bladder tumor  11/12/2011    Procedure: TRANSURETHRAL RESECTION OF BLADDER TUMOR (TURBT);  Surgeon: Molli Hazard, MD;  Location: [facility];  Service: Urology;  Laterality: N/A;  with PK Gyrus c-arm gyrus  digital ureteroscope    There were no vitals filed for this visit.  Visit Diagnosis:  Left wrist pain  Left hand weakness      Subjective Assessment - 06/05/15 1128    Subjective Pt does  report some increased swelling in L wrist but no increase in pain.    Currently in Pain? No/denies   Pain Score 0-No pain   Pain Location Wrist   Pain Orientation Left                         [facility] Adult PT Treatment/Exercise - 06/05/15 0001    Lumbar Exercises: Aerobic   UBE (Upper Arm Bike) 8 min constant work #35    Hand Exercises   Other Hand Exercises Velcroboard-key-10x back and forth, stick roller 10 x back and forth    Wrist Exercises   Bar Weights/Barbell (Forearm Supination) 4 lbs   Bar Weights/Barbell (Forearm Pronation) 4 lbs   Wrist Radial Deviation 20 reps;Strengthening;Left  2 sets    Theraband Level (Radial Deviation) Level 4 (Blue)   Wrist Ulnar Deviation AROM;20 reps  2 sets   Theraband Level (Ulnar Deviation) Level 4 (Blue)   Other wrist exercises Physo ball wall presses LUE only 15  reps 2 sets   Other wrist exercises Flexion/ext with 10lb weight sustained hold with manual overpressure and slight contract relax   Modalities   Modalities Cryotherapy   Cryotherapy   Number Minutes Cryotherapy 10 Minutes  Cryotherapy Location Wrist   Type of Cryotherapy Ice pack   Manual Therapy   Manual Therapy Joint mobilization;Passive ROM   Manual therapy comments carpals, metacarpals, RCJ, RUJ   Soft tissue mobilization performed swelling STM with elbow on table flexed at 90 degrees   Passive ROM flex/ext/UD/RD                  PT Short Term Goals - 05/29/15 1228    PT SHORT TERM GOAL #3   Title improve L grip strength to at least 20# for improved function (06/06/15)   Status Partially Met           PT Long Term Goals - 06/05/15 1214    PT LONG TERM GOAL #1   Title independent with advanced HEP (07/04/15)   Status Partially Met   PT LONG TERM GOAL #2   Title LUE ROM equal to RUE for improved function (07/04/15)   Status On-going               Plan - 06/05/15 1212    Clinical Impression Statement Pt tolerated treatment  well, pt continues to c/o of increased pain during manual therapy. Pt able to tolerate interventions with increased weight   Pt will benefit from skilled therapeutic intervention in order to improve on the following deficits Pain;Decreased strength;Increased edema;Decreased range of motion;Impaired UE functional use   PT Frequency 2x / week   PT Duration 8 weeks   PT Treatment/Interventions ADLs/Self Care Home Management;Therapeutic exercise;Therapeutic activities;Functional mobility training;Patient/family education;Manual techniques;Taping;Passive range of motion   PT Next Visit Plan continue with ROM/joint mobilizations and strengthening, modalities PRN   PT Home Exercise Plan Added in spider webbing with fingers on the wall         Problem List There are no active problems to display for this patient.   Scot Jun, PTA 06/05/2015, 12:16 PM  [place] [place], [place], [address] Phone: [phone]   Fax:  [phone]

## 2015-06-07 ENCOUNTER — Encounter: Payer: Self-pay | Admitting: Physical Therapy

## 2015-06-07 ENCOUNTER — Ambulatory Visit: Payer: Medicare Other | Admitting: Physical Therapy

## 2015-06-07 DIAGNOSIS — M25532 Pain in left wrist: Secondary | ICD-10-CM | POA: Diagnosis not present

## 2015-06-07 DIAGNOSIS — R29898 Other symptoms and signs involving the musculoskeletal system: Secondary | ICD-10-CM

## 2015-06-07 NOTE — Therapy (Signed)
[place], [place], [address] Phone: [phone]   Fax:  [phone]  Physical Therapy Treatment  Patient Details  Name: Ricardo [name] MRN: [id number] Date of Birth: 05-Apr-1941 Referring Provider:  Garald Balding, MD  Encounter Date: 06/07/2015      PT End of Session - 06/07/15 1142    Visit Number 9   Number of Visits 16   Date for PT Re-Evaluation 07/04/15   PT Start Time 1053   PT Stop Time 1142   PT Time Calculation (min) 49 min   Activity Tolerance Patient tolerated treatment well   Behavior During Therapy [facility] for tasks assessed/performed      Past Medical History  Diagnosis Date  . Hypertension   . Hyperlipidemia   . Coronary artery disease CARDIOLOGIST- DR Wynonia Lawman-  LAST VISIT 2 WKS AGO-- WILL REQUEST NOTE AND EKG    STRESS TEST -- JULY 2009  . Post PTCA 2001-   X1 STENT TO RCA    AND 1996  . Bladder cancer DX   OCT 2011--  FOLLOWED BY DR WOODRUFF    S/P TURBT,  HIGH GRADE BLADDER CANCER  . Arthritis     SHOULDERS, NECK , RIGHT HIP  . Nocturia     Past Surgical History  Procedure Laterality Date  . Coronary angioplasty  1996  . Coronary angioplasty with stent placement  2001    STENT TO RCA  . Cysto/ bladder bx  10-22-10  . Transurethral resection of bladder tumor  08-27-10    RIGHT ANTERIOR DOME  . Lumbar fusion    . Cystoscopy w/ retrogrades  11/12/2011    Procedure: CYSTOSCOPY WITH RETROGRADE PYELOGRAM;  Surgeon: Molli Hazard, MD;  Location: [facility];  Service: Urology;  Laterality: Bilateral;  . Transurethral resection of bladder tumor  11/12/2011    Procedure: TRANSURETHRAL RESECTION OF BLADDER TUMOR (TURBT);  Surgeon: Molli Hazard, MD;  Location: [facility];  Service: Urology;  Laterality: N/A;  with PK Gyrus c-arm gyrus  digital ureteroscope    There were no vitals filed for this visit.  Visit Diagnosis:   Left wrist pain  Left hand weakness      Subjective Assessment - 06/07/15 1053    Subjective Pt reports no new changes but L wrist is improving    Limitations Lifting;House hold activities   Patient Stated Goals regain normal activity of wrist   Currently in Pain? Yes   Pain Score 6    Pain Location Wrist   Pain Orientation Left   Pain Descriptors / Indicators Constant                         [facility] Adult PT Treatment/Exercise - 06/07/15 0001    Lumbar Exercises: Aerobic   UBE (Upper Arm Bike) 8 min constant work #35    Shoulder Exercises: Power Heritage manager #35 15 reps 2 sets    Hand Exercises   Other Hand Exercises Velcroboard-key-10x back and forth, stick roller 10 x back and forth    Wrist Exercises   Bar Weights/Barbell (Forearm Supination) 4 lbs   Bar Weights/Barbell (Forearm Pronation) 4 lbs   Wrist Radial Deviation 20 reps;Strengthening;Left  3 sets   Theraband Level (Radial Deviation) Level 4 (Blue)   Wrist Ulnar Deviation AROM;20 reps;Theraband  3 sets    Theraband Level (Ulnar Deviation) Level  4 (Blue)   Other wrist exercises  wall push ups 15 reps 2 sets, Egg squeeze 20 reps 2 sets    Other wrist exercises Flexion/ext with 10lb weight sustained hold with manual overpressure 15 reps 2 sets    Manual Therapy   Manual Therapy Joint mobilization;Passive ROM   Manual therapy comments carpals, metacarpals, RCJ, RUJ   Soft tissue mobilization performed swelling STM with elbow on table flexed at 90 degrees   Passive ROM flex/ext/UD/RD                  PT Short Term Goals - 05/29/15 1228    PT SHORT TERM GOAL #3   Title improve L grip strength to at least 20# for improved function (06/06/15)   Status Partially Met           PT Long Term Goals - 06/07/15 1143    PT LONG TERM GOAL #3   Title improve L grip strength to at least 50# for improved function (07/04/15)               Plan - 06/07/15 1142     Clinical Impression Statement Pt continues to progress with PT able to tolerated new interventions with increased weight.    Pt will benefit from skilled therapeutic intervention in order to improve on the following deficits Pain;Decreased strength;Increased edema;Decreased range of motion;Impaired UE functional use   PT Frequency 2x / week   PT Duration 8 weeks   PT Treatment/Interventions ADLs/Self Care Home Management;Therapeutic exercise;Therapeutic activities;Functional mobility training;Patient/family education;Manual techniques;Taping;Passive range of motion   PT Next Visit Plan continue with ROM/joint mobilizations and strengthening, modalities PRN        Problem List There are no active problems to display for this patient.   Scot Jun, PTA  06/07/2015, 11:45 AM  [place] [address] [place], [place], [address] Phone: [phone]   Fax:  [phone]

## 2015-06-12 ENCOUNTER — Encounter: Payer: Self-pay | Admitting: Physical Therapy

## 2015-06-12 ENCOUNTER — Ambulatory Visit: Payer: Medicare Other | Admitting: Physical Therapy

## 2015-06-12 DIAGNOSIS — M25532 Pain in left wrist: Secondary | ICD-10-CM | POA: Diagnosis not present

## 2015-06-12 DIAGNOSIS — R29898 Other symptoms and signs involving the musculoskeletal system: Secondary | ICD-10-CM

## 2015-06-12 NOTE — Therapy (Signed)
[place], [place], [address] Phone: [phone]   Fax:  [phone]  Physical Therapy Treatment  Patient Details  Name: Ricardo [name] MRN: [id number] Date of Birth: 06/15/1941 Referring Provider:  Garald Balding, MD  Encounter Date: 06/12/2015      PT End of Session - 06/12/15 1226    Visit Number 10   Number of Visits 16   Date for PT Re-Evaluation 07/04/15   PT Start Time 3532   PT Stop Time 1227   PT Time Calculation (min) 39 min   Activity Tolerance Patient tolerated treatment well   Behavior During Therapy [facility] for tasks assessed/performed      Past Medical History  Diagnosis Date  . Hypertension   . Hyperlipidemia   . Coronary artery disease CARDIOLOGIST- DR Wynonia Lawman-  LAST VISIT 2 WKS AGO-- WILL REQUEST NOTE AND EKG    STRESS TEST -- JULY 2009  . Post PTCA 2001-   X1 STENT TO RCA    AND 1996  . Bladder cancer DX   OCT 2011--  FOLLOWED BY DR WOODRUFF    S/P TURBT,  HIGH GRADE BLADDER CANCER  . Arthritis     SHOULDERS, NECK , RIGHT HIP  . Nocturia     Past Surgical History  Procedure Laterality Date  . Coronary angioplasty  1996  . Coronary angioplasty with stent placement  2001    STENT TO RCA  . Cysto/ bladder bx  10-22-10  . Transurethral resection of bladder tumor  08-27-10    RIGHT ANTERIOR DOME  . Lumbar fusion    . Cystoscopy w/ retrogrades  11/12/2011    Procedure: CYSTOSCOPY WITH RETROGRADE PYELOGRAM;  Surgeon: Molli Hazard, MD;  [facility];  Service: Urology;  Laterality: Bilateral;  . Transurethral resection of bladder tumor  11/12/2011    Procedure: TRANSURETHRAL RESECTION OF BLADDER TUMOR (TURBT);  Surgeon: Molli Hazard, MD;  Location: [facility];  Service: Urology;  Laterality: N/A;  with PK Gyrus c-arm gyrus  digital ureteroscope    There were no vitals filed for this visit.  Visit  Diagnosis:  Left hand weakness  Left wrist pain      Subjective Assessment - 06/12/15 1151    Subjective Pt reports no new changes.     Limitations --  None    Patient Stated Goals regain normal activity of wrist   Currently in Pain? Yes   Pain Score 4    Pain Location Wrist   Pain Orientation Left   Pain Descriptors / Indicators Shooting;Patsi Sears Adult PT Treatment/Exercise - 06/12/15 0001    Lumbar Exercises: Aerobic   UBE (Upper Arm Bike) 8 min constant work #35    Shoulder Exercises: Power Heritage manager #35 15 reps 2 sets    Other Power Tower Exercises Lat pull downs #45 15 reps 2 sets, Chest press #15 10 reps 2 sets    Wrist Exercises   Bar Weights/Barbell (Forearm Supination) 4 lbs   Bar Weights/Barbell (Forearm Pronation) 4 lbs   Wrist Radial Deviation 20 reps;Strengthening;Left   Theraband Level (Radial Deviation) Level 4 (Blue)  2 sets    Wrist Ulnar Deviation AROM;20 reps;Theraband   Theraband Level (Ulnar Deviation) Level 4 (Blue)  2 sets  Other wrist exercises  wall push ups 15 reps 2 sets, Egg squeeze 20 reps 2 sets    Other wrist exercises Flexion/ext with 10lb weight sustained hold with manual overpressure 15 reps 2 sets    Manual Therapy   Manual Therapy Joint mobilization;Passive ROM   Manual therapy comments carpals, metacarpals, RCJ, RUJ   Soft tissue mobilization performed swelling STM with elbow on table flexed at 90 degrees   Passive ROM flex/ext/UD/RD                  PT Short Term Goals - 06/12/15 1207    PT SHORT TERM GOAL #3   Title improve L grip strength to at least 20# for improved function (06/06/15)   Status Achieved           PT Long Term Goals - 06/12/15 1208    PT LONG TERM GOAL #3   Title improve L grip strength to at least 50# for improved function (07/04/15)   Status On-going               Plan - 06/12/15 1227    Clinical Impression  Statement Pt reports increased pain with chest press intervention. Pt has met short term grip goal.    Pt will benefit from skilled therapeutic intervention in order to improve on the following deficits Pain;Decreased strength;Increased edema;Decreased range of motion;Impaired UE functional use   PT Frequency 2x / week   PT Duration 8 weeks   PT Treatment/Interventions ADLs/Self Care Home Management;Therapeutic exercise;Therapeutic activities;Functional mobility training;Patient/family education;Manual techniques;Taping;Passive range of motion   PT Next Visit Plan continue with ROM/joint mobilizations and strengthening, modalities PRN        Problem List There are no active problems to display for this patient.   Scot Jun. PTA  06/12/2015, 12:28 PM  [place], [place], [address] Phone: [phone]   Fax:  [phone]

## 2015-06-14 ENCOUNTER — Ambulatory Visit: Payer: Medicare Other | Admitting: Physical Therapy

## 2015-06-14 ENCOUNTER — Encounter: Payer: Self-pay | Admitting: Physical Therapy

## 2015-06-14 DIAGNOSIS — M25532 Pain in left wrist: Secondary | ICD-10-CM | POA: Diagnosis not present

## 2015-06-14 DIAGNOSIS — R29898 Other symptoms and signs involving the musculoskeletal system: Secondary | ICD-10-CM

## 2015-06-14 NOTE — Therapy (Addendum)
[place], [place], [address] Phone: [phone]   Fax:  [phone]  Physical Therapy Treatment  Patient Details  Name: Ricardo [name] MRN: [id number] Date of Birth: 10-23-1941 Referring Provider:  Garald Balding, MD  Encounter Date: 06/14/2015      PT End of Session - 06/14/15 1225    Visit Number 11   Number of Visits 16   Date for PT Re-Evaluation 07/04/15   PT Start Time 1937   PT Stop Time 1225   PT Time Calculation (min) 31 min      Past Medical History  Diagnosis Date  . Hypertension   . Hyperlipidemia   . Coronary artery disease CARDIOLOGIST- DR Wynonia Lawman-  LAST VISIT 2 WKS AGO-- WILL REQUEST NOTE AND EKG    STRESS TEST -- JULY 2009  . Post PTCA 2001-   X1 STENT TO RCA    AND 1996  . Bladder cancer DX   OCT 2011--  FOLLOWED BY DR WOODRUFF    S/P TURBT,  HIGH GRADE BLADDER CANCER  . Arthritis     SHOULDERS, NECK , RIGHT HIP  . Nocturia     Past Surgical History  Procedure Laterality Date  . Coronary angioplasty  1996  . Coronary angioplasty with stent placement  2001    STENT TO RCA  . Cysto/ bladder bx  10-22-10  . Transurethral resection of bladder tumor  08-27-10    RIGHT ANTERIOR DOME  . Lumbar fusion    . Cystoscopy w/ retrogrades  11/12/2011    Procedure: CYSTOSCOPY WITH RETROGRADE PYELOGRAM;  Surgeon: Molli Hazard, MD;  Location: [facility];  Service: Urology;  Laterality: Bilateral;  . Transurethral resection of bladder tumor  11/12/2011    Procedure: TRANSURETHRAL RESECTION OF BLADDER TUMOR (TURBT);  Surgeon: Molli Hazard, MD;  Location[facility];  Service: Urology;  Laterality: N/A;  with PK Gyrus c-arm gyrus  digital ureteroscope    There were no vitals filed for this visit.  Visit Diagnosis:  Left hand weakness  Left wrist pain      Subjective Assessment - 06/14/15 1155    Subjective Pt reports  that he had pain after last treatment but it has eased off now.   Patient Stated Goals regain normal activity of wrist   Currently in Pain? Yes   Pain Score 3    Pain Location Wrist   Pain Orientation Left   Pain Descriptors / Indicators Constant                         [facility] Adult PT Treatment/Exercise - 06/14/15 0001    Lumbar Exercises: Aerobic   UBE (Upper Arm Bike) 6 min constant work #30    Shoulder Exercises: Power Heritage manager #35 20 reps 2 sets    Other Power Tower Exercises Lat pull downs #45 20 reps, Chest press #15 15 reps, 2 sets    Wrist Exercises   Bar Weights/Barbell (Forearm Supination) 5 lbs   Bar Weights/Barbell (Forearm Pronation) 5 lbs   Wrist Radial Deviation 20 reps;Strengthening;Left   Theraband Level (Radial Deviation) --  black   Wrist Ulnar Deviation AROM;20 reps;Theraband   Theraband Level (Ulnar Deviation) --  Black    Other wrist exercises Egg squeeze 20 reps 2 sets    Other wrist exercises Flexion/ext with 10lb weight sustained hold with  manual overpressure 15 reps 2 sets    Manual Therapy   Manual Therapy Joint mobilization;Passive ROM   Manual therapy comments carpals, metacarpals, RCJ, RUJ   Passive ROM flex/ext/UD/RD                  PT Short Term Goals - 06/14/15 1226    PT SHORT TERM GOAL #2   Title improve LUE ROM (wrist flex/ext, supination) by at least 10 degrees for improved motion and function (06/06/15)   PT SHORT TERM GOAL #3   Title improve L grip strength to at least 20# for improved function (06/06/15)           PT Long Term Goals - 06/12/15 1208    PT LONG TERM GOAL #3   Title improve L grip strength to at least 50# for improved function (07/04/15)   Status On-going               Plan - 06/14/15 1225    Clinical Impression Statement Pt 10 minutes late for PT treatment. Pt continues to c/0 of pain with chest press intervention.    Pt will benefit from skilled  therapeutic intervention in order to improve on the following deficits Pain;Decreased strength;Increased edema;Decreased range of motion;Impaired UE functional use   PT Frequency 2x / week   PT Duration 8 weeks   PT Treatment/Interventions ADLs/Self Care Home Management;Therapeutic exercise;Therapeutic activities;Functional mobility training;Patient/family education;Manual techniques;Taping;Passive range of motion   PT Next Visit Plan continue with ROM/joint mobilizations and strengthening, modalities PRN     PHYSICAL THERAPY DISCHARGE SUMMARY  Visits from Start of Care: 11   Plan: Patient agrees to discharge.  Patient goals were partially met. Patient is being discharged due to being pleased with the current functional level.  ?????       Problem List There are no active problems to display for this patient.   Scot Jun, PTA 06/14/2015, 12:28 PM  [place] [address] [place], [place], [address] Phone: [phone]   Fax:  [phone]

## 2015-08-14 ENCOUNTER — Other Ambulatory Visit: Payer: Self-pay | Admitting: Family Medicine

## 2015-12-13 ENCOUNTER — Other Ambulatory Visit: Payer: Self-pay | Admitting: Gastroenterology

## 2016-06-26 ENCOUNTER — Other Ambulatory Visit: Payer: Self-pay | Admitting: Family Medicine

## 2016-06-26 DIAGNOSIS — Z136 Encounter for screening for cardiovascular disorders: Secondary | ICD-10-CM

## 2016-07-04 ENCOUNTER — Other Ambulatory Visit: Payer: Self-pay | Admitting: Family Medicine

## 2016-07-04 ENCOUNTER — Ambulatory Visit
Admission: RE | Admit: 2016-07-04 | Discharge: 2016-07-04 | Disposition: A | Discharge status: Home / Self Care | Payer: Medicare Other | Source: Ambulatory Visit | Attending: Family Medicine | Admitting: Family Medicine

## 2016-07-04 DIAGNOSIS — Z136 Encounter for screening for cardiovascular disorders: Secondary | ICD-10-CM

## 2016-10-28 ENCOUNTER — Other Ambulatory Visit: Payer: Self-pay | Admitting: Cardiology

## 2016-10-28 DIAGNOSIS — I714 Abdominal aortic aneurysm, without rupture, unspecified: Secondary | ICD-10-CM

## 2016-11-11 ENCOUNTER — Other Ambulatory Visit: Payer: Medicare Other

## 2016-11-24 ENCOUNTER — Other Ambulatory Visit: Payer: Medicare Other

## 2016-11-25 ENCOUNTER — Other Ambulatory Visit: Payer: Self-pay | Admitting: Cardiology

## 2016-11-25 DIAGNOSIS — I714 Abdominal aortic aneurysm, without rupture, unspecified: Secondary | ICD-10-CM

## 2016-11-27 ENCOUNTER — Ambulatory Visit
Admission: RE | Admit: 2016-11-27 | Discharge: 2016-11-27 | Disposition: A | Discharge status: Home / Self Care | Payer: Medicare Other | Source: Ambulatory Visit | Attending: Cardiology | Admitting: Cardiology

## 2016-11-27 DIAGNOSIS — I714 Abdominal aortic aneurysm, without rupture, unspecified: Secondary | ICD-10-CM

## 2018-05-04 ENCOUNTER — Encounter: Payer: Self-pay | Admitting: Cardiology

## 2018-08-30 ENCOUNTER — Encounter: Payer: Self-pay | Admitting: Cardiology

## 2018-08-30 DIAGNOSIS — Z8551 Personal history of malignant neoplasm of bladder: Secondary | ICD-10-CM

## 2018-08-30 DIAGNOSIS — I2510 Atherosclerotic heart disease of native coronary artery without angina pectoris: Secondary | ICD-10-CM

## 2018-08-30 DIAGNOSIS — E785 Hyperlipidemia, unspecified: Secondary | ICD-10-CM | POA: Insufficient documentation

## 2018-08-30 DIAGNOSIS — I714 Abdominal aortic aneurysm, without rupture, unspecified: Secondary | ICD-10-CM | POA: Insufficient documentation

## 2018-08-30 DIAGNOSIS — I10 Essential (primary) hypertension: Secondary | ICD-10-CM | POA: Insufficient documentation

## 2018-08-30 HISTORY — DX: Essential (primary) hypertension: I10

## 2018-08-30 HISTORY — DX: Abdominal aortic aneurysm, without rupture: I71.4

## 2018-08-30 HISTORY — DX: Abdominal aortic aneurysm, without rupture, unspecified: I71.40

## 2018-08-30 HISTORY — DX: Personal history of malignant neoplasm of bladder: Z85.51

## 2018-08-30 HISTORY — DX: Atherosclerotic heart disease of native coronary artery without angina pectoris: I25.10

## 2018-08-30 NOTE — Progress Notes (Signed)
Ricardo Ricardo [name]  Date of visit:  05/04/2018 DOB:  1941-11-04    Age:  77 yrs. Medical record number:  [id number]     Account number:  [id number] Primary Care Provider: Maury Dus ____________________________ CURRENT DIAGNOSES  1. CAD Native without angina  2. Essential (primary) hypertension  3. Abdominal aortic aneurysm, without rupture  4. Hyperlipidemia  5. Male erectile dysfunction, unspecified  6. Personal history of malignant neoplasm of bladder  7. Presence of coronary angioplasty implant and graft  8. Other long term (current) drug therapy ____________________________ ALLERGIES  Amlodipine, Intolerance-unknown  Rosuvastatin, Myopathy or muscle pain  Vytorin, Myopathy or muscle pain ____________________________ MEDICATIONS  1. telmisartan 40 mg-hydrochlorothiazide 12.5 mg tablet, 1 p.o. 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 He quit taking all of his medications after doing Ricardo [name] reading on the Internet about potential side effects. He has had myalgias some most of the statin drugs. We had gone through switching him to ran in Crestor and we have tried Ricardo [name] number of other statin agents and even went through authorization to get him approved for Repatha. He has Ricardo [name] history of coronary artery disease with previous stent placement. He denies angina and has no PND orthopnea or edema. ____________________________ PAST HISTORY  Past Medical Illnesses:  hypertension, hyperlipidemia, glucose intolerance, bladder cancer treated with resection and BCG;  Cardiovascular Illnesses:  CAD;  Surgical Procedures:  bone graft for vertebral fracture, bladder surgery;  NYHA Classification:  I;  Canadian Angina Classification:  Class 0: Asymptomatic;  Cardiology Procedures-Invasive:  PTCA 1996,  stent RCA May 2001, cardiac cath (left) May 2001;  Cardiology Procedures-Noninvasive:  treadmill cardiolite July 2009;  Cardiac Cath Results:  normal Left main, 30% stenosis ostial LAD, 50% stenosis OM 3, 80% stenosis mid RCA;  LVEF of 64% documented via nuclear study on 06/01/2008,   ____________________________ CARDIO-PULMONARY TEST DATES EKG Date:  11/06/2017;   Cardiac Cath Date:  04/15/2000;  Stent Placement Date: 04/15/2000;   ____________________________ FAMILY HISTORY Father -- Father dead, Family history unknown, Deceased Mother -- Mother dead, CVA, Hypertension, Death of unknown cause, Deceased ____________________________ SOCIAL HISTORY Alcohol Use:  does not use alcohol;  Smoking:  used to smoke but quit Prior to 1980;  Diet:  fat modified diet;  Lifestyle:  married and native of [place];  Exercise:  walking;  Occupation:  retired;  Residence:  lives with wife;  Job Description:  Restaurant hood maintenance;   ____________________________ REVIEW OF SYSTEMS General:  denies recent weight change, fatique or change in exercise tolerance. Eyes: wears eye glasses/contact lenses Respiratory: denies dyspnea, cough, wheezing or hemoptysis. Cardiovascular:  please review HPI  Genitourinary-Male: frequency, terminal dribbling, treatment for bladder cancer  Musculoskeletal:  denies arthritis, venous insufficiency, or muscle weakness Neurological:  denies headaches, stroke, or TIA Psychiatric:  insomnia  ____________________________ PHYSICAL EXAMINATION VITAL SIGNS  Blood Pressure:  170/90 Sitting, Left arm, regular cuff  , 174/100 Standing, Left arm and regular cuff   Pulse:  84/min. Weight:  159.00 lbs. Height:  67.00"BMI: 25  Constitutional:  Hispanic, male, in no acute distress, looks stated age, mildly obese Skin:  scattered hemangiomas Head:  normocephalic, balding male hair pattern Neck:  supple, no masses, thyromegaly, JVD. Carotid pulses are full and equal bilaterally without  bruits. Chest:  normal symmetry, clear to auscultation. Cardiac:  regular rhythm, normal S1 and S2, No S3  or S4, no murmurs, gallops or rubs detected. Peripheral Pulses:  the femoral,dorsalis pedis, and posterior tibial pulses are full and equal bilaterally with no bruits auscultated. Extremities & Back:  no deformities, clubbing, cyanosis, erythema or edema observed. Normal muscle strength and tone. Neurological:  no gross motor or sensory deficits noted, affect appropriate, oriented x3. ____________________________ MOST RECENT LIPID PANEL 12/23/17  CHOL TOTL 264 mg/dl, LDL 178 NM, HDL 60 mg/dl, TRIGLYCER 136 mg/dl, ALT 20 u/l, ALK PHOS 63 u/l, CHOL/HDL 4.4 (Calc) and AST 23 u/l ____________________________ IMPRESSIONS/PLAN  1. Medical noncompliance 2. Hyperlipidemia of statin intolerance 3. Hypertension due to noncompliance 4. CAD with previous stenting  Recommendations:  Long discussion with patient. We talked about his medicines and failure to comply with medicines and that was why his blood pressure was high. Reviewed lipid results with elevation of cholesterol. I told him he would need  medicine for hypertension and he was concerned because he read something about lisinopril on the Internet so I changed him to telmisartan/HCTZ. We had an extensive discussion about PCSK9 inhibitor's. He states that he does not want to use Ricardo [name] needle and I explained to him that the current delivery device does not show needles and could be administered once per month. After next extensive argumentative discussion with him he was agreeable to at least try this and we will try to get the patient's support representative to help with this. I asked him to return in 3 months for followup and to followup also with his primary doctor regarding his blood pressure. ____________________________ TODAYS ORDERS  1. Return Visit: 3 months                       ____________________________ Cardiology Physician:  Kerry Hough MD [facility]

## 2018-09-30 ENCOUNTER — Ambulatory Visit (INDEPENDENT_AMBULATORY_CARE_PROVIDER_SITE_OTHER): Payer: Medicare Other | Admitting: Cardiology

## 2018-09-30 ENCOUNTER — Encounter: Payer: Self-pay | Admitting: Cardiology

## 2018-09-30 VITALS — BP 156/90 | HR 63 | Ht 67.0 in | Wt 166.0 lb

## 2018-09-30 DIAGNOSIS — I10 Essential (primary) hypertension: Secondary | ICD-10-CM | POA: Diagnosis not present

## 2018-09-30 DIAGNOSIS — I2510 Atherosclerotic heart disease of native coronary artery without angina pectoris: Secondary | ICD-10-CM

## 2018-09-30 DIAGNOSIS — E782 Mixed hyperlipidemia: Secondary | ICD-10-CM

## 2018-09-30 DIAGNOSIS — I714 Abdominal aortic aneurysm, without rupture, unspecified: Secondary | ICD-10-CM

## 2018-09-30 MED ORDER — TELMISARTAN-HCTZ 80-25 MG PO TABS: 1.0000 | ORAL_TABLET | Freq: Every day | ORAL | 1 refills | Status: DC

## 2018-09-30 MED ORDER — CHLORTHALIDONE 25 MG PO TABS: 25.0000 mg | ORAL_TABLET | Freq: Every day | ORAL | 1 refills | Status: DC

## 2018-09-30 MED ORDER — NITROGLYCERIN 0.4 MG SL SUBL: 0.4000 mg | SUBLINGUAL_TABLET | SUBLINGUAL | 11 refills | Status: DC | PRN

## 2018-09-30 MED ORDER — CHLORTHALIDONE 25 MG PO TABS: 12.5000 mg | ORAL_TABLET | Freq: Every day | ORAL | 1 refills | Status: DC

## 2018-09-30 NOTE — Patient Instructions (Signed)
Medication Instructions:  Your physician has recommended you make the following change in your medication:   INCREASE telmisartan-HCTZ to 80-12.5 mg daily START chlorthalidone 12.5 mg (1/2 tab) daily START Nitroglycerin 0.4 mg sublingual (under your tongue) as needed for chest pain. If experiencing chest pain, stop what you are doing and sit down. Take 1 nitroglycerin and wait 5 minutes. If chest pain continues, take another nitroglycerin and wait 5 minutes. If chest pain does not subside, take 1 more nitroglycerin and dial 911. You make take a total of 3 nitroglycerin in a 15 minute time frame. STOP Nifedipine  If you need a refill on your cardiac medications before your next appointment, please call your pharmacy.   Lab work: Your physician recommends that you have the following labs drawn: BMP, CBC, TSH, liver and lipid panel in 6 weeks. Please come fasting for these labs, no appointment is needed.  If you have labs (blood work) drawn today and your tests are completely normal, you will receive your results only by: Marland Kitchen MyChart Message (if you have MyChart) OR . A paper copy in the mail If you have any lab test that is abnormal or we need to change your treatment, we will call you to review the results.  Testing/Procedures: Your physician has requested that you have a stress echocardiogram. For further information please visit [email or web address]. Please follow instruction sheet as given.  Your physician has requested that you have an abdominal aorta duplex. During this test, an ultrasound is used to evaluate the aorta. Allow 30 minutes for this exam. Do not eat after midnight the day before and avoid carbonated beverages.  Follow-Up: At [facility], you and your health needs are our priority.  As part of our continuing mission to provide you with exceptional heart care, we have created designated Provider Care Teams.  These Care Teams include your primary Cardiologist (physician) and  Advanced Practice Providers (APPs -  Physician Assistants and Nurse Practitioners) who all work together to provide you with the care you need, when you need it.  You will need a follow up appointment in 6 months.  Please call our office 2 months in advance to schedule this appointment.  You may see another member of our [employer] Provider Team in [place]: Jenne Campus, MD . Shirlee More, MD  Any Other Special Instructions Will Be Listed Below (If Applicable).  Please call the office with your dose of repatha.  Please return to the office in 1 week for BP, pulse, and BMP.  Chlorthalidone tablets What is this medicine? CHLORTHALIDONE (klor THAL i done) is a diuretic. It increases the amount of urine passed, which causes the body to lose salt and water. This medicine is used to treat high blood pressure and edema or water retention. This medicine may be used for other purposes; ask your health care provider or pharmacist if you have questions. COMMON BRAND NAME(S): Thalitone What should I tell my health care provider before I take this medicine? They need to know if you have any of these conditions: -asthma -diabetes -gout -kidney disease -liver disease -parathyroid disease -systemic lupus erythematosus (SLE) -taking cortisone, digoxin, lithium carbonate, or drugs for diabetes -an unusual or allergic reaction to chlorthalidone, sulfa drugs, other medicines, foods, dyes, or preservatives -pregnant or trying to get pregnant -breast-feeding How should I use this medicine? Take this medicine by mouth with a glass of water. Follow the directions on the prescription label. It is best to take your dose in  the morning with food. Take your medicine at regular intervals. Do not take your medicine more often than directed. Do not stop taking except on your doctor's advice. Talk to your pediatrician regarding the use of this medicine in children. Special care may be needed. Overdosage:  If you think you have taken too much of this medicine contact a poison control center or emergency room at once. NOTE: This medicine is only for you. Do not share this medicine with others. What if I miss a dose? If you miss a dose, take it as soon as you can. If it is almost time for your next dose, take only that dose. Do not take double or extra doses. What may interact with this medicine? -barbiturate medicines for sleep or seizure control -digoxin -lithium -medicines for diabetes -norepinephrine -other medicines for high blood pressure -some pain medicines -steroid hormones like prednisone, cortisone, hydrocortisone, corticotropin -tubocurarine This list may not describe all possible interactions. Give your health care provider a list of all the medicines, herbs, non-prescription drugs, or dietary supplements you use. Also tell them if you smoke, drink alcohol, or use illegal drugs. Some items may interact with your medicine. What should I watch for while using this medicine? Visit your doctor or health care professional for regular check ups. Check your blood pressure as directed. Ask your doctor or health care professional what your blood pressure should be and when you should contact him or her. You may need to be on a special diet while taking this medicine. Ask your doctor. You may get drowsy or dizzy. Do not drive, use machinery, or do anything that needs mental alertness until you know how this medicine affects you. Do not stand or sit up quickly, especially if you are an older patient. This reduces the risk of dizzy or fainting spells. Alcohol may interfere with the effect of this medicine. Avoid alcoholic drinks. This medicine may affect your blood sugar level. If you have diabetes, check with your doctor or health care professional before changing the dose of your diabetic medicine. This medicine can make you more sensitive to the sun. Keep out of the sun. If you cannot avoid being  in the sun, wear protective clothing and use sunscreen. Do not use sun lamps or tanning beds/booths. What side effects may I notice from receiving this medicine? Side effects that you should report to your doctor or health care professional as soon as possible: -allergic reactions like skin rash, itching or hives, swelling of the face, lips, or tongue -dark urine -dry mouth -excess thirst -fast, irregular heart rate -fever, chills -muscle pain, cramps, or spasm -nausea, vomiting -redness, blistering, peeling or loosening of the skin, including inside the mouth -tingling, pain or numbness in the hands or feet -unusually weak or tired -yellowing of the eyes or skin Side effects that usually do not require medical attention (report to your doctor or health care professional if they continue or are bothersome): -diarrhea or constipation -headache -impotence -loss of appetite -stomach upset This list may not describe all possible side effects. Call your doctor for medical advice about side effects. You may report side effects to FDA at 1-800-FDA-1088. Where should I keep my medicine? Keep out of the reach of children. Store at room temperature between 15 and 30 degrees C (59 and 86 degrees F). Keep container tightly closed. Throw away any unused medicine after the expiration date. NOTE: This sheet is a summary. It may not cover all possible information. If you  have questions about this medicine, talk to your doctor, pharmacist, or health care provider.  2018 Elsevier/Gold Standard (2008-02-08 15:28:48) Nitroglycerin sublingual tablets What is this medicine? NITROGLYCERIN (nye troe GLI ser in) is a type of vasodilator. It relaxes blood vessels, increasing the blood and oxygen supply to your heart. This medicine is used to relieve chest pain caused by angina. It is also used to prevent chest pain before activities like climbing stairs, going outdoors in cold weather, or sexual activity. This  medicine may be used for other purposes; ask your health care provider or pharmacist if you have questions. COMMON BRAND NAME(S): Nitroquick, Nitrostat, Nitrotab What should I tell my health care provider before I take this medicine? They need to know if you have any of these conditions: -anemia -head injury, recent stroke, or bleeding in the brain -liver disease -previous heart attack -an unusual or allergic reaction to nitroglycerin, other medicines, foods, dyes, or preservatives -pregnant or trying to get pregnant -breast-feeding How should I use this medicine? Take this medicine by mouth as needed. At the first sign of an angina attack (chest pain or tightness) place one tablet under your tongue. You can also take this medicine 5 to 10 minutes before an event likely to produce chest pain. Follow the directions on the prescription label. Let the tablet dissolve under the tongue. Do not swallow whole. Replace the dose if you accidentally swallow it. It will help if your mouth is not dry. Saliva around the tablet will help it to dissolve more quickly. Do not eat or drink, smoke or chew tobacco while a tablet is dissolving. If you are not better within 5 minutes after taking ONE dose of nitroglycerin, call 9-1-1 immediately to seek emergency medical care. Do not take more than 3 nitroglycerin tablets over 15 minutes. If you take this medicine often to relieve symptoms of angina, your doctor or health care professional may provide you with different instructions to manage your symptoms. If symptoms do not go away after following these instructions, it is important to call 9-1-1 immediately. Do not take more than 3 nitroglycerin tablets over 15 minutes. Talk to your pediatrician regarding the use of this medicine in children. Special care may be needed. Overdosage: If you think you have taken too much of this medicine contact a poison control center or emergency room at once. NOTE: This medicine is  only for you. Do not share this medicine with others. What if I miss a dose? This does not apply. This medicine is only used as needed. What may interact with this medicine? Do not take this medicine with any of the following medications: -certain migraine medicines like ergotamine and dihydroergotamine (DHE) -medicines used to treat erectile dysfunction like sildenafil, tadalafil, and vardenafil -riociguat This medicine may also interact with the following medications: -alteplase -aspirin -heparin -medicines for high blood pressure -medicines for mental depression -other medicines used to treat angina -phenothiazines like chlorpromazine, mesoridazine, prochlorperazine, thioridazine This list may not describe all possible interactions. Give your health care provider a list of all the medicines, herbs, non-prescription drugs, or dietary supplements you use. Also tell them if you smoke, drink alcohol, or use illegal drugs. Some items may interact with your medicine. What should I watch for while using this medicine? Tell your doctor or health care professional if you feel your medicine is no longer working. Keep this medicine with you at all times. Sit or lie down when you take your medicine to prevent falling if you feel  dizzy or faint after using it. Try to remain calm. This will help you to feel better faster. If you feel dizzy, take several deep breaths and lie down with your feet propped up, or bend forward with your head resting between your knees. You may get drowsy or dizzy. Do not drive, use machinery, or do anything that needs mental alertness until you know how this drug affects you. Do not stand or sit up quickly, especially if you are an older patient. This reduces the risk of dizzy or fainting spells. Alcohol can make you more drowsy and dizzy. Avoid alcoholic drinks. Do not treat yourself for coughs, colds, or pain while you are taking this medicine without asking your doctor or  health care professional for advice. Some ingredients may increase your blood pressure. What side effects may I notice from receiving this medicine? Side effects that you should report to your doctor or health care professional as soon as possible: -blurred vision -dry mouth -skin rash -sweating -the feeling of extreme pressure in the head -unusually weak or tired Side effects that usually do not require medical attention (report to your doctor or health care professional if they continue or are bothersome): -flushing of the face or neck -headache -irregular heartbeat, palpitations -nausea, vomiting This list may not describe all possible side effects. Call your doctor for medical advice about side effects. You may report side effects to FDA at 1-800-FDA-1088. Where should I keep my medicine? Keep out of the reach of children. Store at room temperature between 20 and 25 degrees C (68 and 77 degrees F). Store in [employer] of [employer]. Protect from light and moisture. Keep tightly closed. Throw away any unused medicine after the expiration date. NOTE: This sheet is a summary. It may not cover all possible information. If you have questions about this medicine, talk to your doctor, pharmacist, or health care provider.  2018 Elsevier/Gold Standard (2013-09-01 17:57:36)

## 2018-09-30 NOTE — Progress Notes (Signed)
Cardiology Office Note:    Date:  09/30/2018   ID:  Lacretia Nicks, DOB Oct 14, 1941, MRN [id number]  PCP:  Maury Dus, MD  Cardiologist:  Jenean Lindau, MD   Referring MD: Maury Dus, MD    ASSESSMENT:    1. Coronary artery disease involving native coronary artery of native heart without angina pectoris   2. Mixed hyperlipidemia   3. Essential hypertension   4. Abdominal aneurysm ([place])    PLAN:    In order of problems listed above:  1. Secondary prevention stressed with the patient.  Importance of compliance with diet and medication stressed and he vocalized understanding.  His blood pressure is stable but he mentions to me that it is elevated so we will up his telmisartan dose. I have stopped his hydrochlorothiazide increased telmisartan to 80 mg.  I have initiated him on a low-dose chlorthalidone.  I have stopped his nifedipine. 2. Patient has not had a stress several years and he has established coronary artery disease and leads a sedentary lifestyle.  He will undergo exercise stress echo. 3. He mentions to me that he just started Repatha back in 6 weeks for liver lipid check. 4. He has history of abdominal aortic aneurysm and evaluated several years ago and we will recheck it. 5. Patient will be seen in follow-up appointment in 6 months or earlier if the patient has any concerns    Medication Adjustments/Labs and Tests Ordered: Current medicines are reviewed at length with the patient today.  Concerns regarding medicines are outlined above.  No orders of the defined types were placed in this encounter.  No orders of the defined types were placed in this encounter.    No chief complaint on file.    History of Present Illness:    Ricardo [name] is a 77 y.o. male.  I have not seen this gentleman before.  He is a patient of Dr. Wynonia Lawman.  He has history of coronary artery disease with stenting in the remote past.  He denies any chest pain orthopnea or PND.  He leads  a sedentary lifestyle.  At the time of my evaluation, the patient is alert awake oriented and in no distress.  No orthopnea or PND.  Past Medical History:  Diagnosis Date  . 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 Arthritis    SHOULDERS, NECK , RIGHT HIP  . Bladder cancer ([place]) DX   OCT 2011--  FOLLOWED BY DR WOODRUFF   S/P TURBT,  HIGH GRADE BLADDER CANCER  . CAD (coronary artery disease), native coronary artery 08/30/2018   normal Left main, 30% stenosis ostial LAD, 50% stenosis OM 3, 80% stenosis mid RCA;  2.5 x 8 mm Tetra Stent Dr. Doylene Canard  . Coronary artery disease CARDIOLOGIST- DR Wynonia Lawman-  LAST VISIT 2 WKS AGO-- WILL REQUEST NOTE AND EKG   STRESS TEST -- JULY 2009  . Essential hypertension 08/30/2018  . Hyperlipidemia   . Hypertension   . Nocturia   . Personal history of malignant neoplasm of bladder 08/30/2018  . Post PTCA 2001-   X1 STENT TO RCA   AND 1996    Past Surgical History:  Procedure Laterality Date  . CORONARY ANGIOPLASTY  1996  . CORONARY ANGIOPLASTY WITH STENT PLACEMENT  2001   STENT TO RCA  . CYSTO/ BLADDER BX  10-22-10  . CYSTOSCOPY W/ RETROGRADES  11/12/2011   Procedure: CYSTOSCOPY WITH RETROGRADE PYELOGRAM;  Surgeon: Molli Hazard, MD;  Location: [facility];  Service: Urology;  Laterality: Bilateral;  . LUMBAR FUSION    . TRANSURETHRAL RESECTION OF BLADDER TUMOR  08-27-10   RIGHT ANTERIOR DOME  . TRANSURETHRAL RESECTION OF BLADDER TUMOR  11/12/2011   Procedure: TRANSURETHRAL RESECTION OF BLADDER TUMOR (TURBT);  Surgeon: Molli Hazard, MD;  Location: [facility];  Service: Urology;  Laterality: N/A;  with PK Gyrus c-arm gyrus  digital ureteroscope    Current Medications: Current Meds  Medication Sig  . aspirin 81 MG tablet Take 1 tablet (81 mg total) by mouth daily.  Marland Kitchen NIFEdipine (ADALAT CC) 30 MG 24 hr tablet Take 1 tablet by mouth daily.  Marland Kitchen telmisartan-hydrochlorothiazide (MICARDIS HCT) 40-12.5 MG  tablet Take 1 tablet by mouth daily.  . vitamin E 400 UNIT capsule Take 400 Units by mouth daily.     Allergies:   Norvasc [amlodipine besylate]   Social History   Socioeconomic History  . Marital status: Married    Spouse name: Not on file  . Number of children: Not on file  . Years of education: Not on file  . Highest education level: Not on file  Occupational History  . Not on file  Social Needs  . Financial resource strain: Not on file  . Food insecurity:    Worry: Not on file    Inability: Not on file  . Transportation needs:    Medical: Not on file    Non-medical: Not on file  Tobacco Use  . Smoking status: Former Smoker    Packs/day: 1.50    Types: Cigarettes    Last attempt to quit: 11/05/1965    Years since quitting: 52.9  . Smokeless tobacco: Never Used  Substance and Sexual Activity  . Alcohol use: No  . Drug use: No  . Sexual activity: Not on file  Lifestyle  . Physical activity:    Days per week: Not on file    Minutes per session: Not on file  . Stress: Not on file  Relationships  . Social connections:    Talks on phone: Not on file    Gets together: Not on file    Attends religious service: Not on file    Active member of club or organization: Not on file    Attends meetings of clubs or organizations: Not on file    Relationship status: Not on file  Other Topics Concern  . Not on file  Social History Narrative  . Not on file     Family History: The patient's family history is not on file.  ROS:   Please see the history of present illness.    All other systems reviewed and are negative.  EKGs/Labs/Other Studies Reviewed:    The following studies were reviewed today: EKG reveals sinus rhythm and nonspecific ST-T changes   Recent Labs: No results found for requested labs within last 8760 hours.  Recent Lipid Panel No results found for: CHOL, TRIG, HDL, CHOLHDL, VLDL, LDLCALC, LDLDIRECT  Physical Exam:    VS:  BP (!) 156/90 (BP  Location: Right Arm, Patient Position: Sitting, Cuff Size: Normal)   Pulse 63   Ht 5\' 7"  (1.702 m)   Wt 166 lb (75.3 kg)   SpO2 98%   BMI 26.00 kg/m     Wt Readings from Last 3 Encounters:  09/30/18 166 lb (75.3 kg)  11/06/11 167 lb (75.8 kg)     GEN: Patient is in no acute distress HEENT: Normal NECK: No JVD; No carotid bruits LYMPHATICS: No  lymphadenopathy CARDIAC: Hear sounds regular, 2/6 systolic murmur at the apex. RESPIRATORY:  Clear to auscultation without rales, wheezing or rhonchi  ABDOMEN: Soft, non-tender, non-distended MUSCULOSKELETAL:  No edema; No deformity  SKIN: Warm and dry NEUROLOGIC:  Alert and oriented x 3 PSYCHIATRIC:  Normal affect   Signed, Jenean Lindau, MD  09/30/2018 1:52 PM    [facility]

## 2018-10-08 ENCOUNTER — Ambulatory Visit (INDEPENDENT_AMBULATORY_CARE_PROVIDER_SITE_OTHER): Payer: Medicare Other | Admitting: Cardiology

## 2018-10-08 VITALS — BP 152/82 | HR 64

## 2018-10-08 DIAGNOSIS — I10 Essential (primary) hypertension: Secondary | ICD-10-CM | POA: Diagnosis not present

## 2018-10-08 NOTE — Patient Instructions (Signed)
Medication Instructions:  Your physician recommends that you continue on your current medications as directed. Please refer to the Current Medication list given to you today.  If you need a refill on your cardiac medications before your next appointment, please call your pharmacy.   Lab work: Your physician recommends that you return for lab work today: BMP.   If you have labs (blood work) drawn today and your tests are completely normal, you will receive your results only by: Marland Kitchen MyChart Message (if you have MyChart) OR . A paper copy in the mail If you have any lab test that is abnormal or we need to change your treatment, we will call you to review the results.  Testing/Procedures: None  Follow-Up: At [facility], you and your health needs are our priority.  As part of our continuing mission to provide you with exceptional heart care, we have created designated Provider Care Teams.  These Care Teams include your primary Cardiologist (physician) and Advanced Practice Providers (APPs -  Physician Assistants and Nurse Practitioners) who all work together to provide you with the care you need, when you need it. You will need a follow up appointment in 2 weeks.

## 2018-10-09 LAB — BASIC METABOLIC PANEL
BUN / CREAT RATIO: 34 — AB (ref 10–24)
BUN: 30 mg/dL — ABNORMAL HIGH (ref 8–27)
CALCIUM: 9.6 mg/dL (ref 8.6–10.2)
CO2: 24 mmol/L (ref 20–29)
Chloride: 102 mmol/L (ref 96–106)
Creatinine, Ser: 0.89 mg/dL (ref 0.76–1.27)
GFR calc non Af Amer: 82 mL/min/{1.73_m2} (ref >59)
GFR, EST AFRICAN AMERICAN: 95 mL/min/{1.73_m2} (ref >59)
GLUCOSE: 110 mg/dL — AB (ref 65–99)
POTASSIUM: 3.9 mmol/L (ref 3.5–5.2)
Sodium: 141 mmol/L (ref 134–144)

## 2018-10-11 NOTE — Progress Notes (Signed)
Patient presents for a blood pressure/heart rate check along with lab work. Patient confirms taking all of his medications as prescribed. His BP was 152/82 and HR was 64. Patient has no complaints at this time. Reviewed patient's chart and vitals with Dr. Bettina Gavia who advised to continue current medications and follow up with Dr. Geraldo Pitter in 2 weeks. Patient is scheduled for an office visit with Dr. Geraldo Pitter on 10/22/18 at 9:40 am in the [facility] office.

## 2018-10-12 ENCOUNTER — Ambulatory Visit (HOSPITAL_BASED_OUTPATIENT_CLINIC_OR_DEPARTMENT_OTHER)
Admission: RE | Admit: 2018-10-12 | Discharge: 2018-10-12 | Disposition: A | Discharge status: Home / Self Care | Payer: Medicare Other | Source: Ambulatory Visit | Attending: Cardiology | Admitting: Cardiology

## 2018-10-12 DIAGNOSIS — I714 Abdominal aortic aneurysm, without rupture: Secondary | ICD-10-CM | POA: Insufficient documentation

## 2018-10-12 DIAGNOSIS — I2510 Atherosclerotic heart disease of native coronary artery without angina pectoris: Secondary | ICD-10-CM | POA: Diagnosis present

## 2018-10-12 NOTE — Progress Notes (Signed)
  Echocardiogram 2D Echocardiogram has been performed.    10/12/2018  Cardell Peach RDCS, RVT

## 2018-10-12 NOTE — Progress Notes (Addendum)
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 The largest aortic measurement is 3.1 cm. No previous exam available for comparison.    Distal abdominal Aortic dissection visualized  Stenosis:  Location Stenosis Stent  Distal Aorta >50% stenosis    Right Common Iliac >50% stenosis  50-99% stenosis   Left Common Iliac <50% stenosis  1-49% stenosis      10/12/18 Cardell Peach

## 2018-10-13 ENCOUNTER — Telehealth: Payer: Self-pay

## 2018-10-13 NOTE — Telephone Encounter (Signed)
Informed patient of AAA [place] results; informed to call the office with any further questions or concerns.

## 2018-10-20 ENCOUNTER — Telehealth: Payer: Self-pay | Admitting: Cardiology

## 2018-10-20 NOTE — Telephone Encounter (Signed)
Patient assistance was never given to either myself or Hart Carwin, [place]. Informed Brandy, CMA with [place]'s office that this was not received. Theadora Rama will be looking into this matter.

## 2018-10-20 NOTE — Telephone Encounter (Signed)
Patient is asking if his [place] assistance paperwork has come back?

## 2018-10-22 ENCOUNTER — Ambulatory Visit (INDEPENDENT_AMBULATORY_CARE_PROVIDER_SITE_OTHER): Payer: Medicare Other | Admitting: Cardiology

## 2018-10-22 ENCOUNTER — Encounter: Payer: Self-pay | Admitting: Cardiology

## 2018-10-22 VITALS — BP 172/78 | HR 66 | Ht 67.0 in | Wt 170.0 lb

## 2018-10-22 DIAGNOSIS — I2510 Atherosclerotic heart disease of native coronary artery without angina pectoris: Secondary | ICD-10-CM

## 2018-10-22 DIAGNOSIS — I714 Abdominal aortic aneurysm, without rupture, unspecified: Secondary | ICD-10-CM

## 2018-10-22 DIAGNOSIS — I10 Essential (primary) hypertension: Secondary | ICD-10-CM

## 2018-10-22 DIAGNOSIS — E782 Mixed hyperlipidemia: Secondary | ICD-10-CM | POA: Diagnosis not present

## 2018-10-22 NOTE — Progress Notes (Signed)
Cardiology Office Note:    Date:  10/22/2018   ID:  DAEMON DOWTY, DOB 1941-06-22, MRN [id number]  PCP:  Maury Dus, MD  Cardiologist:  Jenean Lindau, MD   Referring MD: Maury Dus, MD    ASSESSMENT:    1. Coronary artery disease involving native coronary artery of native heart without angina pectoris   2. Mixed hyperlipidemia   3. Essential hypertension   4. Abdominal aneurysm ([place])    PLAN:    In order of problems listed above:  1. Secondary prevention stressed with the patient.  Importance of compliance with diet and medication stressed and he vocalized understanding. 2. He is going to give [place] a call after reaching home today to give his exact list of his medications.  We will titrate his medications accordingly. 3. He will be back in 1 week for a pulse blood pressure check and blood work including fasting lipids 4. Patient will be seen in follow-up appointment in 6 months or earlier if the patient has any concerns. 5. His abdominal aortic aneurysm issues were checked and they were found to be unremarkable and I discussed this report with him.   Medication Adjustments/Labs and Tests Ordered: Current medicines are reviewed at length with the patient today.  Concerns regarding medicines are outlined above.  No orders of the defined types were placed in this encounter.  No orders of the defined types were placed in this encounter.    No chief complaint on file.    History of Present Illness:    Ricardo [name] is a 77 y.o. male.  Patient has history of essential hypertension and coronary artery disease.  He denies any problems at this time and takes care of activities of daily living.  No chest pain orthopnea or PND.  The patient is here for follow-up.  He is not clear about his blood pressure medications.  He checks his blood pressure at home and it is elevated.  Past Medical History:  Diagnosis Date  . 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 Arthritis    SHOULDERS, NECK , RIGHT HIP  . Bladder cancer ([place]) DX   OCT 2011--  FOLLOWED BY DR WOODRUFF   S/P TURBT,  HIGH GRADE BLADDER CANCER  . CAD (coronary artery disease), native coronary artery 08/30/2018   normal Left main, 30% stenosis ostial LAD, 50% stenosis OM 3, 80% stenosis mid RCA;  2.5 x 8 mm Tetra Stent Dr. Doylene Canard  . Coronary artery disease CARDIOLOGIST- DR Wynonia Lawman-  LAST VISIT 2 WKS AGO-- WILL REQUEST NOTE AND EKG   STRESS TEST -- JULY 2009  . Essential hypertension 08/30/2018  . Hyperlipidemia   . Hypertension   . Nocturia   . Personal history of malignant neoplasm of bladder 08/30/2018  . Post PTCA 2001-   X1 STENT TO RCA   AND 1996    Past Surgical History:  Procedure Laterality Date  . CORONARY ANGIOPLASTY  1996  . CORONARY ANGIOPLASTY WITH STENT PLACEMENT  2001   STENT TO RCA  . CYSTO/ BLADDER BX  10-22-10  . CYSTOSCOPY W/ RETROGRADES  11/12/2011   Procedure: CYSTOSCOPY WITH RETROGRADE PYELOGRAM;  Surgeon: Molli Hazard, MD;  Location: [facility];  Service: Urology;  Laterality: Bilateral;  . LUMBAR FUSION    . TRANSURETHRAL RESECTION OF BLADDER TUMOR  08-27-10   RIGHT ANTERIOR DOME  . TRANSURETHRAL RESECTION OF BLADDER TUMOR  11/12/2011   Procedure: TRANSURETHRAL RESECTION OF BLADDER TUMOR (TURBT);  Surgeon: Dennard Schaumann  Jasmine December, MD;  Location: [facility];  Service: Urology;  Laterality: N/A;  with PK Gyrus c-arm gyrus  digital ureteroscope    Current Medications: Current Meds  Medication Sig  . aspirin 81 MG tablet Take 1 tablet (81 mg total) by mouth daily.  . chlorthalidone (HYGROTON) 25 MG tablet Take 0.5 tablets (12.5 mg total) by mouth daily.  . Evolocumab with Infusor 420 MG/3.5ML SOCT Inject 1 ampule into the skin every 30 (thirty) days.  . nitroGLYCERIN (NITROSTAT) 0.4 MG SL tablet Place 1 tablet (0.4 mg total) under the tongue every 5 (five) minutes as needed.  Marland Kitchen telmisartan-hydrochlorothiazide (MICARDIS HCT)  80-25 MG tablet Take 1 tablet by mouth daily.  . vitamin E 400 UNIT capsule Take 400 Units by mouth daily.     Allergies:   Norvasc [amlodipine besylate]   Social History   Socioeconomic History  . Marital status: Married    Spouse name: Not on file  . Number of children: Not on file  . Years of education: Not on file  . Highest education level: Not on file  Occupational History  . Not on file  Social Needs  . Financial resource strain: Not on file  . Food insecurity:    Worry: Not on file    Inability: Not on file  . Transportation needs:    Medical: Not on file    Non-medical: Not on file  Tobacco Use  . Smoking status: Former Smoker    Packs/day: 1.50    Types: Cigarettes    Last attempt to quit: 11/05/1965    Years since quitting: 52.9  . Smokeless tobacco: Never Used  Substance and Sexual Activity  . Alcohol use: No  . Drug use: No  . Sexual activity: Not on file  Lifestyle  . Physical activity:    Days per week: Not on file    Minutes per session: Not on file  . Stress: Not on file  Relationships  . Social connections:    Talks on phone: Not on file    Gets together: Not on file    Attends religious service: Not on file    Active member of club or organization: Not on file    Attends meetings of clubs or organizations: Not on file    Relationship status: Not on file  Other Topics Concern  . Not on file  Social History Narrative  . Not on file     Family History: The patient's family history is not on file.  ROS:   Please see the history of present illness.    All other systems reviewed and are negative.  EKGs/Labs/Other Studies Reviewed:    The following studies were reviewed today: I discussed my findings with the patient at extensive length.   Recent Labs: 10/08/2018: BUN 30; Creatinine, Ser 0.89; Potassium 3.9; Sodium 141  Recent Lipid Panel No results found for: CHOL, TRIG, HDL, CHOLHDL, VLDL, LDLCALC, LDLDIRECT  Physical Exam:     VS:  BP (!) 172/78 (BP Location: Right Arm, Patient Position: Sitting, Cuff Size: Normal)   Pulse 66   Ht 5\' 7"  (1.702 m)   Wt 170 lb (77.1 kg)   SpO2 98%   BMI 26.63 kg/m     Wt Readings from Last 3 Encounters:  10/22/18 170 lb (77.1 kg)  09/30/18 166 lb (75.3 kg)  11/06/11 167 lb (75.8 kg)     GEN: Patient is in no acute distress HEENT: Normal NECK: No JVD; No carotid bruits  LYMPHATICS: No lymphadenopathy CARDIAC: Hear sounds regular, 2/6 systolic murmur at the apex. RESPIRATORY:  Clear to auscultation without rales, wheezing or rhonchi  ABDOMEN: Soft, non-tender, non-distended MUSCULOSKELETAL:  No edema; No deformity  SKIN: Warm and dry NEUROLOGIC:  Alert and oriented x 3 PSYCHIATRIC:  Normal affect   Signed, Jenean Lindau, MD  10/22/2018 10:15 AM    [place]

## 2018-10-22 NOTE — Patient Instructions (Signed)
Medication Instructions:  Your physician recommends that you continue on your current medications as directed. Please refer to the Current Medication list given to you today.  If you need a refill on your cardiac medications before your next appointment, please call your pharmacy.   Lab work: Your physician recommends that you return for lab work in one week: BMP,CBC,TSH,LFT, and Lipids If you have labs (blood work) drawn today and your tests are completely normal, you will receive your results only by: Marland Kitchen MyChart Message (if you have MyChart) OR . A paper copy in the mail If you have any lab test that is abnormal or we need to change your treatment, we will call you to review the results.  Testing/Procedures: None  Follow-Up: At [facility], you and your health needs are our priority.  As part of our continuing mission to provide you with exceptional heart care, we have created designated Provider Care Teams.  These Care Teams include your primary Cardiologist (physician) and Advanced Practice Providers (APPs -  Physician Assistants and Nurse Practitioners) who all work together to provide you with the care you need, when you need it. You will need a follow up appointment in 6 months.  Please call our office 2 months in advance to schedule this appointment.  You may see No primary care provider on file. or another member of our [employer] in [place]: Jenne Campus, MD . Shirlee More, MD  Any Other Special Instructions Will Be Listed Below (If Applicable).

## 2018-10-26 LAB — BASIC METABOLIC PANEL
BUN/Creatinine Ratio: 34 — ABNORMAL HIGH (ref 10–24)
BUN: 27 mg/dL (ref 8–27)
CALCIUM: 9.6 mg/dL (ref 8.6–10.2)
CHLORIDE: 100 mmol/L (ref 96–106)
CO2: 25 mmol/L (ref 20–29)
Creatinine, Ser: 0.8 mg/dL (ref 0.76–1.27)
GFR, EST AFRICAN AMERICAN: 100 mL/min/{1.73_m2} (ref >59)
GFR, EST NON AFRICAN AMERICAN: 86 mL/min/{1.73_m2} (ref >59)
Glucose: 106 mg/dL — ABNORMAL HIGH (ref 65–99)
Potassium: 4.8 mmol/L (ref 3.5–5.2)
Sodium: 142 mmol/L (ref 134–144)

## 2018-10-26 LAB — CBC WITH DIFFERENTIAL/PLATELET
BASOS ABS: 0.1 10*3/uL (ref 0.0–0.2)
Basos: 1 %
EOS (ABSOLUTE): 0.1 10*3/uL (ref 0.0–0.4)
Eos: 1 %
HEMOGLOBIN: 14.8 g/dL (ref 13.0–17.7)
Hematocrit: 44.1 % (ref 37.5–51.0)
IMMATURE GRANULOCYTES: 0 %
Immature Grans (Abs): 0 10*3/uL (ref 0.0–0.1)
Lymphocytes Absolute: 1.3 10*3/uL (ref 0.7–3.1)
Lymphs: 23 %
MCH: 29.2 pg (ref 26.6–33.0)
MCHC: 33.6 g/dL (ref 31.5–35.7)
MCV: 87 fL (ref 79–97)
MONOCYTES: 11 %
Monocytes Absolute: 0.7 10*3/uL (ref 0.1–0.9)
NEUTROS ABS: 3.7 10*3/uL (ref 1.4–7.0)
NEUTROS PCT: 64 %
PLATELETS: 148 10*3/uL — AB (ref 150–450)
RBC: 5.07 x10E6/uL (ref 4.14–5.80)
RDW: 13 % (ref 12.3–15.4)
WBC: 5.8 10*3/uL (ref 3.4–10.8)

## 2018-10-26 LAB — HEPATIC FUNCTION PANEL
ALK PHOS: 69 IU/L (ref 39–117)
ALT: 17 IU/L (ref 0–44)
AST: 19 IU/L (ref 0–40)
Albumin: 4.8 g/dL (ref 3.5–4.8)
Bilirubin Total: 0.5 mg/dL (ref 0.0–1.2)
Bilirubin, Direct: 0.14 mg/dL (ref 0.00–0.40)
Total Protein: 6.8 g/dL (ref 6.0–8.5)

## 2018-10-26 LAB — LIPID PANEL
CHOL/HDL RATIO: 2.6 ratio (ref 0.0–5.0)
Cholesterol, Total: 168 mg/dL (ref 100–199)
HDL: 65 mg/dL (ref >39)
LDL Calculated: 82 mg/dL (ref 0–99)
TRIGLYCERIDES: 105 mg/dL (ref 0–149)
VLDL CHOLESTEROL CAL: 21 mg/dL (ref 5–40)

## 2018-10-26 LAB — TSH: TSH: 1.75 u[IU]/mL (ref 0.450–4.500)

## 2018-10-29 ENCOUNTER — Other Ambulatory Visit: Payer: Self-pay

## 2018-10-29 DIAGNOSIS — I2510 Atherosclerotic heart disease of native coronary artery without angina pectoris: Secondary | ICD-10-CM

## 2018-10-29 DIAGNOSIS — E782 Mixed hyperlipidemia: Secondary | ICD-10-CM

## 2018-10-29 MED ORDER — CO Q-10 200 MG PO CAPS: 200.0000 mg | ORAL_CAPSULE | Freq: Every day | ORAL | 0 refills | Status: DC

## 2018-10-29 MED ORDER — ATORVASTATIN CALCIUM 10 MG PO TABS: 10.0000 mg | ORAL_TABLET | Freq: Every day | ORAL | 0 refills | Status: DC

## 2019-02-21 ENCOUNTER — Other Ambulatory Visit: Payer: Self-pay | Admitting: Cardiology

## 2019-02-21 NOTE — Telephone Encounter (Signed)
Left message for pt to call back. Refill request for Atorvastatin but it is not on his medication list when seen last. Need to verify if pt is taking the Atorvastatin 10 mg daily.

## 2019-03-21 ENCOUNTER — Other Ambulatory Visit: Payer: Self-pay | Admitting: Family Medicine

## 2019-03-21 DIAGNOSIS — I714 Abdominal aortic aneurysm, without rupture, unspecified: Secondary | ICD-10-CM

## 2019-03-31 ENCOUNTER — Ambulatory Visit
Admission: RE | Admit: 2019-03-31 | Discharge: 2019-03-31 | Disposition: A | Discharge status: Home / Self Care | Payer: Medicare Other | Source: Ambulatory Visit | Attending: Family Medicine | Admitting: Family Medicine

## 2019-03-31 DIAGNOSIS — I714 Abdominal aortic aneurysm, without rupture, unspecified: Secondary | ICD-10-CM

## 2019-05-13 ENCOUNTER — Other Ambulatory Visit: Payer: Self-pay | Admitting: Cardiology

## 2019-05-13 NOTE — Telephone Encounter (Signed)
Telmisartan refill sent to [facility] in [place]

## 2019-06-06 ENCOUNTER — Other Ambulatory Visit: Payer: Self-pay | Admitting: Cardiology

## 2019-06-07 ENCOUNTER — Other Ambulatory Visit: Payer: Self-pay | Admitting: Cardiology

## 2019-06-07 NOTE — Telephone Encounter (Signed)
Atorvastatin Refill sent to [place]

## 2019-06-07 NOTE — Telephone Encounter (Signed)
Rx refill sent to pharmacy. 

## 2019-08-25 ENCOUNTER — Other Ambulatory Visit: Payer: Self-pay | Admitting: Family Medicine

## 2019-08-25 DIAGNOSIS — I714 Abdominal aortic aneurysm, without rupture, unspecified: Secondary | ICD-10-CM

## 2019-09-15 ENCOUNTER — Ambulatory Visit
Admission: RE | Admit: 2019-09-15 | Discharge: 2019-09-15 | Disposition: A | Discharge status: Home / Self Care | Payer: Medicare Other | Source: Ambulatory Visit | Attending: Family Medicine | Admitting: Family Medicine

## 2019-09-15 DIAGNOSIS — I714 Abdominal aortic aneurysm, without rupture, unspecified: Secondary | ICD-10-CM

## 2020-04-05 DIAGNOSIS — J342 Deviated nasal septum: Secondary | ICD-10-CM | POA: Insufficient documentation

## 2020-04-05 DIAGNOSIS — R490 Dysphonia: Secondary | ICD-10-CM | POA: Insufficient documentation

## 2020-04-11 ENCOUNTER — Ambulatory Visit: Payer: Medicare Other | Attending: Otolaryngology | Admitting: Speech Pathology

## 2020-04-11 ENCOUNTER — Other Ambulatory Visit: Payer: Self-pay

## 2020-04-11 ENCOUNTER — Encounter: Payer: Self-pay | Admitting: Speech Pathology

## 2020-04-11 DIAGNOSIS — R498 Other voice and resonance disorders: Secondary | ICD-10-CM | POA: Diagnosis not present

## 2020-04-11 NOTE — Therapy (Signed)
[facility] [address] [place], [place], [address] Phone: [phone]   Fax:  [phone]  Speech Language Pathology Evaluation  Patient Details  Name: Ricardo [name] MRN: [id number] Date of Birth: [birth year] Referring Provider (SLP): Dr. Izora Gala   Encounter Date: 04/11/2020  End of Session - 04/11/20 1424    Visit Number  1    Number of Visits  17    Date for SLP Re-Evaluation  06/06/20    SLP Start Time  0850    SLP Stop Time   0931    SLP Time Calculation (min)  41 min    Activity Tolerance  Patient tolerated treatment well       Past Medical History:  Diagnosis Date  . 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 Arthritis    SHOULDERS, NECK , RIGHT HIP  . Bladder cancer ([place]) DX   OCT 2011--  FOLLOWED BY DR WOODRUFF   S/P TURBT,  HIGH GRADE BLADDER CANCER  . CAD (coronary artery disease), native coronary artery 08/30/2018   normal Left main, 30% stenosis ostial LAD, 50% stenosis OM 3, 80% stenosis mid RCA;  2.5 x 8 mm Tetra Stent Dr. Doylene Canard  . Coronary artery disease CARDIOLOGIST- DR Wynonia Lawman-  LAST VISIT 2 WKS AGO-- WILL REQUEST NOTE AND EKG   STRESS TEST -- JULY 2009  . Essential hypertension 08/30/2018  . Hyperlipidemia   . Hypertension   . Nocturia   . Personal history of malignant neoplasm of bladder 08/30/2018  . Post PTCA 2001-   X1 STENT TO RCA   AND 1996    Past Surgical History:  Procedure Laterality Date  . CORONARY ANGIOPLASTY  1996  . CORONARY ANGIOPLASTY WITH STENT PLACEMENT  2001   STENT TO RCA  . CYSTO/ BLADDER BX  10-22-10  . CYSTOSCOPY W/ RETROGRADES  11/12/2011   Procedure: CYSTOSCOPY WITH RETROGRADE PYELOGRAM;  Surgeon: Molli Hazard, MD;  Location: [facility];  Service: Urology;  Laterality: Bilateral;  . LUMBAR FUSION    . TRANSURETHRAL RESECTION OF BLADDER TUMOR  08-27-10   RIGHT ANTERIOR DOME  . TRANSURETHRAL RESECTION OF BLADDER TUMOR  11/12/2011    Procedure: TRANSURETHRAL RESECTION OF BLADDER TUMOR (TURBT);  Surgeon: Molli Hazard, MD;  Location: [facility];  Service: Urology;  Laterality: N/A;  with PK Gyrus c-arm gyrus  digital ureteroscope    There were no vitals filed for this visit.  Subjective Assessment - 04/11/20 1420    Subjective  Sometimes I can speak fine, other times its just a whisper    Patient is accompained by:  Family member   spouse Rose   Currently in Pain?  No/denies         SLP Evaluation [facility] - 04/11/20 1420      Subjective   Patient/Family Stated Goal  I need to be understood better                      SLP Education - 04/11/20 1423    Education Details  HEP for laryngeal tension; abdominal breathing    Person(s) Educated  Patient;Spouse    Methods  Explanation;Demonstration;Verbal cues;Handout    Comprehension  Returned demonstration;Need further instruction;Verbal cues required;Other (comment)   visual cues      SLP Short Term Goals - 04/11/20 1429      SLP SHORT TERM GOAL #1   Title  Pt iwll perfrom vocal function exercises/HEP with rare min  A over 3 sessions    Time  4    Period  Weeks    Status  New      SLP SHORT TERM GOAL #2   Title  Pt will utilize abdominal breathing in 19/20 sentence responses over 2 sessions    Time  4    Period  Weeks    Status  New      SLP SHORT TERM GOAL #3   Title  Pt will follow 3 aspects of vocal hygiene program with rare min A over 3 sessions    Baseline  at time of eval, reduce thoat clears, increase hydration, reduce talking over background noise were most salient    Time  4    Period  Weeks       SLP Long Term Goals - 04/11/20 1433      SLP LONG TERM GOAL #1   Title  Pt will utilize abdominal breathing over 20 minute conversation with occasional min A over 3 sessions    Time  8    Period  Weeks    Status  New      SLP LONG TERM GOAL #2   Title  Pt will perform HEP for voice with mod I over 3  sessions    Time  8    Period  Weeks    Status  New      SLP LONG TERM GOAL #3   Title  Pt will follow 3 aspects of vocal hygiene program over 6 sessions    Time  8    Period  Weeks    Status  New      SLP LONG TERM GOAL #4   Title  Pt will demonstrate WNL voice quality and edurance in 15 minute conversation outside of [place] room over 2 sessions    Time  8    Period  Weeks    Status  New       Plan - 04/11/20 1424    Clinical Impression Statement  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 His wife joined him 1/2 way through this evaluation. She reports difficulty understanding her husband at home. Ricardo [name] reports variability day to day in his voice, from aphonic to have a normal voice. He was significantly hoarse with tension today. When asked about his voice quality today, he reported "it is better today, medium voice." He denies difficulty swallowing   Voice case history: Pt reports moderate voice use. Pt reports daily water intake as 16-32 oz per day and caffeine intake is 16 oz per day. . Onset of voice problem was 1 year ago  and began with unknown etiology .  Voice Related Quality of Life (VRQOL) score is 57.5 (>80 is WNL). Habitual pitch is 196 Hz, range 138.6 to 261.6 Hz in extended speech, slightly high  for age/gender. Maximum Phonation Time (MPT) for /a/ is 7.77  seconds (<12 seconds is suggestive of glottal/respiratory insufficiency). /s/ to /z/ ratio is 1.13  (>1.4 may indicate laryngeal pathology)). Vocal quality is hoarse, strained, harsh, dipliphonic with episodes of aphonia.. Pt was observed to clear his throat 4x this evaluation. He reports he clears his throat to "get my voice back when it goes out." He was unaware he cleared his throat today. Glottal Function Index is 12 (WNL<4). Reflux Symptom Index is 15 (<10-13 is WNL) Pt did affirm reflux "sometimes." Ricardo [name] demonstrated exaggerated clavicular breathing. I recommend skilled  ST  to maximize intelligibility.     Speech Therapy Frequency  2x / week    Duration  --   8 weeks or 17 visits   Treatment/Interventions  Environmental controls;Cueing hierarchy;SLP instruction and feedback;Compensatory strategies;Functional tasks;Compensatory techniques;Internal/external aids;Multimodal communcation approach;Patient/family education    Potential to Achieve Goals  Good    SLP Home Exercise Plan  SOVTE, abdominal breathing    Consulted and Agree with Plan of Care  Patient;Family member/caregiver       Patient will benefit from skilled therapeutic intervention in order to improve the following deficits and impairments:   Other voice and resonance disorders    Problem List Patient Active Problem List   Diagnosis Date Noted  . CAD (coronary artery disease), native coronary artery 08/30/2018  . Hyperlipidemia 08/30/2018  . Essential hypertension 08/30/2018  . 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 Personal history of malignant neoplasm of bladder 08/30/2018    Ricardo [name], Annye Rusk MS, CCC-SLP 04/11/2020, 2:42 PM  [facility] [address] [place], [place], [address] Phone: [phone]   Fax:  [phone]  Name: Ricardo [name] MRN: [id number] Date of Birth: [birth year]

## 2020-04-11 NOTE — Patient Instructions (Signed)
  Muscle Tension Dysphonia  Eliminate throat clears - have your wife signal to you when you clear your throat  Instead of clearing your throat: Take a sip of water and swallow hard, do a forceful "huh" and swallow hard  Exercises to reduce tension in your larynx:  Gargle 10 breaths twice a day  Blow bubbles in a straw 10 breaths twice a day  Blow bubbles in a straw with your voice on 10 breaths twice a day  Hum through straw 10 breaths twice a day    ABDOMINAL BREATHING    . Shoulders down - this is a cue to relax . Place your hand on your abdomen - this helps you focus on easy abdominal breath support - the best and most relaxed way to breathe . Breathe in through your nose and fill your belly with air, watching your hand move outward . Breathe out through your mouth and watch your belly move in.    Think of your belly as a balloon, when you fill with air (inhale), the balloon gets bigger. As the air goes out (exhale), the balloon deflates.  If you are having difficulty coordinating this, lay on your back with a plastic cup on your belly and repeat the above steps, watching you belly move up with inhalation and down with exhalations  Practice breathing in and out in front of a mirror, watching your belly Breathe in for a count of 5 and breathe out for a count of 5  Practice this throughout the day when you are not having symptoms. For example: in the car, when watching TV, before medications. Regular practice when you are feeling well is important.         Provided by: Georgiann Hahn. SLP [phone]

## 2020-04-20 ENCOUNTER — Ambulatory Visit: Payer: Medicare Other

## 2020-04-23 ENCOUNTER — Ambulatory Visit: Payer: Medicare Other | Admitting: Speech Pathology

## 2020-04-27 ENCOUNTER — Other Ambulatory Visit: Payer: Self-pay

## 2020-04-27 ENCOUNTER — Ambulatory Visit: Payer: Medicare Other | Attending: Otolaryngology

## 2020-04-27 DIAGNOSIS — R498 Other voice and resonance disorders: Secondary | ICD-10-CM

## 2020-04-27 NOTE — Therapy (Signed)
[facility] [address] [place], [place], [address] Phone: [phone]   Fax:  [phone]  Speech Language Pathology Treatment  Patient Details  Name: Ricardo [name] MRN: [id number] Date of Birth: [birth year] Referring Provider (SLP): Dr. Thea Silversmith   Encounter Date: 04/27/2020   End of Session - 04/27/20 1108    Visit Number 2    Number of Visits 17    Date for SLP Re-Evaluation 06/06/20    SLP Start Time 1018    SLP Stop Time  1100    SLP Time Calculation (min) 42 min    Activity Tolerance Patient tolerated treatment well           Past Medical History:  Diagnosis Date  . 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 Arthritis    SHOULDERS, NECK , RIGHT HIP  . Bladder cancer ([place]) DX   OCT 2011--  FOLLOWED BY DR WOODRUFF   S/P TURBT,  HIGH GRADE BLADDER CANCER  . CAD (coronary artery disease), native coronary artery 08/30/2018   normal Left main, 30% stenosis ostial LAD, 50% stenosis OM 3, 80% stenosis mid RCA;  2.5 x 8 mm Tetra Stent Dr. Doylene Canard  . Coronary artery disease CARDIOLOGIST- DR Wynonia Lawman-  LAST VISIT 2 WKS AGO-- WILL REQUEST NOTE AND EKG   STRESS TEST -- JULY 2009  . Essential hypertension 08/30/2018  . Hyperlipidemia   . Hypertension   . Nocturia   . Personal history of malignant neoplasm of bladder 08/30/2018  . Post PTCA 2001-   X1 STENT TO RCA   AND 1996    Past Surgical History:  Procedure Laterality Date  . CORONARY ANGIOPLASTY  1996  . CORONARY ANGIOPLASTY WITH STENT PLACEMENT  2001   STENT TO RCA  . CYSTO/ BLADDER BX  10-22-10  . CYSTOSCOPY W/ RETROGRADES  11/12/2011   Procedure: CYSTOSCOPY WITH RETROGRADE PYELOGRAM;  Surgeon: Molli Hazard, MD;  [facility]: [facility];  Service: Urology;  Laterality: Bilateral;  . LUMBAR FUSION    . TRANSURETHRAL RESECTION OF BLADDER TUMOR  08-27-10   RIGHT ANTERIOR DOME  . TRANSURETHRAL RESECTION OF BLADDER TUMOR  11/12/2011    Procedure: TRANSURETHRAL RESECTION OF BLADDER TUMOR (TURBT);  Surgeon: Molli Hazard, MD;  Location: [facility];  Service: Urology;  Laterality: N/A;  with PK Gyrus c-arm gyrus  digital ureteroscope    There were no vitals filed for this visit.          ADULT SLP TREATMENT - 04/27/20 1027      General Information   Behavior/Cognition Alert;Cooperative;Pleasant mood      Treatment Provided   Treatment provided Cognitive-Linquistic      Cognitive-Linquistic Treatment   Treatment focused on Voice    Skilled Treatment SLP reviewed goals with pt and he agreed that they seem appropriate. SLP targeted abdmonial breatihng (AB) with pt. SLP req'd to provide usual max cues initially, faded to rare min cues - pt with shallow breathing pattern which resulted in him feeling light-headed. SLP educated pt why that is happening (not enough O2) and told pt that the feeling will subside as pt gains more success with using AB. SLP brought pt to PT room to work  on AB in supine. Pt remarked it was easier to feel AB in this position. SLP explained rationale for AB. Pt's breath cycle still short, but pt did not report light-headedness. SLP educated pt re: vocal hygiene - goal is to get 216 oz.bottles  a day instead of 1, and to practice AB 15 minutes BID until next session.        Assessment / Recommendations / Plan   Plan Continue with current plan of care      Progression Toward Goals   Progression toward goals Progressing toward goals            SLP Education - 04/27/20 1106    Education Details Abdominal breathing(AB), rationale for AB, HEP for AB, vocal hygiene    Person(s) Educated Patient    Methods Explanation;Demonstration;Verbal cues    Comprehension Verbalized understanding;Returned demonstration;Verbal cues required;Need further instruction            SLP Short Term Goals - 04/27/20 1110      SLP SHORT TERM GOAL #1   Title Pt iwll perfrom vocal function  exercises/HEP with rare min A over 3 sessions    Time 4    Period Weeks    Status On-going      SLP SHORT TERM GOAL #2   Title Pt will utilize abdominal breathing in 19/20 sentence responses over 2 sessions    Time 4    Period Weeks    Status On-going      SLP SHORT TERM GOAL #3   Title Pt will follow 3 aspects of vocal hygiene program with rare min A over 3 sessions    Baseline at time of eval, reduce thoat clears, increase hydration, reduce talking over background noise were most salient    Time 4    Period Weeks    Status On-going            SLP Long Term Goals - 04/27/20 1110      SLP LONG TERM GOAL #1   Title Pt will utilize abdominal breathing over 20 minute conversation with occasional min A over 3 sessions    Time 8    Period Weeks    Status On-going      SLP LONG TERM GOAL #2   Title Pt will perform HEP for voice with mod I over 3 sessions    Time 8    Period Weeks    Status On-going      SLP LONG TERM GOAL #3   Title Pt will follow 3 aspects of vocal hygiene program over 6 sessions    Time 8    Period Weeks    Status On-going      SLP LONG TERM GOAL #4   Title Pt will demonstrate WNL voice quality and edurance in 15 minute conversation outside of ST room over 2 sessions    Time 8    Period Weeks    Status On-going            Plan - 04/27/20 1109    Clinical Impression Statement Ricardo [name] is 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 His wife joined him 1/2 way through this evaluation. She reports difficulty understanding her husband at home. Ricardo [name] reports variability day to day in his voice, from aphonic to have a normal voice. He was significantly hoarse with tension today. Pt worked today with SLP on abdominal breathing. He denies difficulty swallowing    Speech Therapy Frequency 2x / week    Duration --   8 weeks or 17 visits   Treatment/Interventions Environmental controls;Cueing hierarchy;SLP instruction and  feedback;Compensatory strategies;Functional tasks;Compensatory techniques;Internal/external aids;Multimodal communcation approach;Patient/family education    Potential to [place]  Exercise Plan SOVTE, abdominal breathing    Consulted and Agree with Plan of Care Patient;Family member/caregiver           Patient will benefit from skilled therapeutic intervention in order to improve the following deficits and impairments:   Other voice and resonance disorders    Problem List Patient Active Problem List   Diagnosis Date Noted  . CAD (coronary artery disease), native coronary artery 08/30/2018  . Hyperlipidemia 08/30/2018  . Essential hypertension 08/30/2018  . 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 Personal history of malignant neoplasm of bladder 08/30/2018    [facility] ,MS, CCC-SLP  04/27/2020, 11:10 AM  [facility] [address] [place], [place], [address] Phone: [phone]   Fax:  [phone]   Name: Ricardo [name] MRN: [id number] Date of Birth: 1941-04-04

## 2020-04-27 NOTE — Patient Instructions (Signed)
° °  Breathe using abdominal breathing, lying down, 15 minutes twice a day.   ============================================ Drink 64 -80 oz.water each day. Do not overuse your voice - if it feels tired, refrain from using it for the rest of the day. Refrain from shouting or loud laughing excessively.

## 2020-05-02 ENCOUNTER — Ambulatory Visit: Payer: Medicare Other

## 2020-05-08 ENCOUNTER — Ambulatory Visit: Payer: Medicare Other | Admitting: Speech Pathology

## 2020-05-10 ENCOUNTER — Ambulatory Visit: Payer: Medicare Other | Admitting: Speech Pathology

## 2020-05-23 ENCOUNTER — Encounter: Payer: Medicare Other | Admitting: Speech Pathology

## 2020-05-28 ENCOUNTER — Encounter: Payer: Medicare Other | Admitting: Speech Pathology

## 2020-05-30 ENCOUNTER — Encounter: Payer: Medicare Other | Admitting: Speech Pathology

## 2020-07-07 ENCOUNTER — Inpatient Hospital Stay (HOSPITAL_BASED_OUTPATIENT_CLINIC_OR_DEPARTMENT_OTHER)
Admission: EM | Admit: 2020-07-07 | Discharge: 2020-07-10 | DRG: 287 | Disposition: A | Discharge status: Home / Self Care | Payer: Medicare Other | Attending: Internal Medicine | Admitting: Internal Medicine

## 2020-07-07 ENCOUNTER — Emergency Department (HOSPITAL_BASED_OUTPATIENT_CLINIC_OR_DEPARTMENT_OTHER): Payer: Medicare Other

## 2020-07-07 ENCOUNTER — Other Ambulatory Visit: Payer: Self-pay

## 2020-07-07 ENCOUNTER — Encounter (HOSPITAL_BASED_OUTPATIENT_CLINIC_OR_DEPARTMENT_OTHER): Payer: Self-pay | Admitting: Emergency Medicine

## 2020-07-07 DIAGNOSIS — Z20822 Contact with and (suspected) exposure to covid-19: Secondary | ICD-10-CM | POA: Diagnosis present

## 2020-07-07 DIAGNOSIS — R0789 Other chest pain: Secondary | ICD-10-CM | POA: Diagnosis not present

## 2020-07-07 DIAGNOSIS — I2510 Atherosclerotic heart disease of native coronary artery without angina pectoris: Secondary | ICD-10-CM | POA: Diagnosis present

## 2020-07-07 DIAGNOSIS — Z7982 Long term (current) use of aspirin: Secondary | ICD-10-CM

## 2020-07-07 DIAGNOSIS — I714 Abdominal aortic aneurysm, without rupture, unspecified: Secondary | ICD-10-CM | POA: Diagnosis present

## 2020-07-07 DIAGNOSIS — I25110 Atherosclerotic heart disease of native coronary artery with unstable angina pectoris: Principal | ICD-10-CM | POA: Diagnosis present

## 2020-07-07 DIAGNOSIS — R001 Bradycardia, unspecified: Secondary | ICD-10-CM | POA: Diagnosis present

## 2020-07-07 DIAGNOSIS — E785 Hyperlipidemia, unspecified: Secondary | ICD-10-CM | POA: Diagnosis present

## 2020-07-07 DIAGNOSIS — Z87891 Personal history of nicotine dependence: Secondary | ICD-10-CM

## 2020-07-07 DIAGNOSIS — Z955 Presence of coronary angioplasty implant and graft: Secondary | ICD-10-CM

## 2020-07-07 DIAGNOSIS — Z79899 Other long term (current) drug therapy: Secondary | ICD-10-CM

## 2020-07-07 DIAGNOSIS — I200 Unstable angina: Secondary | ICD-10-CM | POA: Diagnosis present

## 2020-07-07 DIAGNOSIS — R079 Chest pain, unspecified: Secondary | ICD-10-CM | POA: Diagnosis present

## 2020-07-07 DIAGNOSIS — I248 Other forms of acute ischemic heart disease: Secondary | ICD-10-CM | POA: Diagnosis present

## 2020-07-07 DIAGNOSIS — Z8551 Personal history of malignant neoplasm of bladder: Secondary | ICD-10-CM

## 2020-07-07 DIAGNOSIS — I10 Essential (primary) hypertension: Secondary | ICD-10-CM | POA: Diagnosis present

## 2020-07-07 LAB — CBC
HCT: 40.1 % (ref 39.0–52.0)
Hemoglobin: 13.1 g/dL (ref 13.0–17.0)
MCH: 28.9 pg (ref 26.0–34.0)
MCHC: 32.7 g/dL (ref 30.0–36.0)
MCV: 88.3 fL (ref 80.0–100.0)
Platelets: 113 10*3/uL — ABNORMAL LOW (ref 150–400)
RBC: 4.54 MIL/uL (ref 4.22–5.81)
RDW: 12.3 % (ref 11.5–15.5)
WBC: 5.9 10*3/uL (ref 4.0–10.5)
nRBC: 0 % (ref 0.0–0.2)

## 2020-07-07 LAB — TROPONIN I (HIGH SENSITIVITY)
Troponin I (High Sensitivity): 12 ng/L (ref <18)
Troponin I (High Sensitivity): 12 ng/L (ref <18)
Troponin I (High Sensitivity): 16 ng/L (ref <18)

## 2020-07-07 LAB — BASIC METABOLIC PANEL
Anion gap: 11 (ref 5–15)
BUN: 32 mg/dL — ABNORMAL HIGH (ref 8–23)
CO2: 23 mmol/L (ref 22–32)
Calcium: 8.7 mg/dL — ABNORMAL LOW (ref 8.9–10.3)
Chloride: 103 mmol/L (ref 98–111)
Creatinine, Ser: 1.01 mg/dL (ref 0.61–1.24)
GFR calc Af Amer: >60 mL/min (ref >60)
GFR calc non Af Amer: >60 mL/min (ref >60)
Glucose, Bld: 107 mg/dL — ABNORMAL HIGH (ref 70–99)
Potassium: 3.7 mmol/L (ref 3.5–5.1)
Sodium: 137 mmol/L (ref 135–145)

## 2020-07-07 LAB — SARS CORONAVIRUS 2 BY RT PCR (HOSPITAL ORDER, PERFORMED IN ~~LOC~~ HOSPITAL LAB): SARS Coronavirus 2: NEGATIVE

## 2020-07-07 MED ORDER — ATORVASTATIN CALCIUM 10 MG PO TABS: 10.0000 mg | ORAL_TABLET | Freq: Every day | ORAL | Status: DC

## 2020-07-07 MED ORDER — TELMISARTAN-HCTZ 80-25 MG PO TABS: 1.0000 | ORAL_TABLET | Freq: Every day | ORAL | Status: DC

## 2020-07-07 MED ORDER — HYDROCHLOROTHIAZIDE 25 MG PO TABS: 25.0000 mg | ORAL_TABLET | Freq: Every day | ORAL | Status: DC

## 2020-07-07 MED ORDER — NITROGLYCERIN 0.4 MG SL SUBL: 0.4000 mg | SUBLINGUAL_TABLET | SUBLINGUAL | Status: DC | PRN

## 2020-07-07 MED ORDER — ASPIRIN 81 MG PO CHEW
324.0000 mg | CHEWABLE_TABLET | Freq: Once | ORAL | Status: AC
  Administered 2020-07-07: 243 mg via ORAL
  Filled 2020-07-07: qty 4

## 2020-07-07 MED ORDER — CHLORTHALIDONE 25 MG PO TABS
12.5000 mg | ORAL_TABLET | Freq: Every day | ORAL | Status: DC
  Administered 2020-07-08 – 2020-07-10 (×3): 12.5 mg via ORAL
  Filled 2020-07-07 (×4): qty 0.5

## 2020-07-07 MED ORDER — IRBESARTAN 300 MG PO TABS
300.0000 mg | ORAL_TABLET | Freq: Every day | ORAL | Status: DC
  Filled 2020-07-07: qty 1

## 2020-07-07 NOTE — ED Triage Notes (Addendum)
Patient states that he had new onset chest pain about 45 minutes ago. He took 1 outdated Nitro and the pain went away. The patient denies any SOB with the pain. Reports that he was diaphoretic and grey looking per daughter when he had the pain. Also reports that the pain was in his left jaw and down his arm. Pt states that he is dizzy and lightheaded now.

## 2020-07-07 NOTE — ED Notes (Signed)
Pt arrives with reports of CP and feeling sluggish states that he had CP around 1550, took 1 Nitro at four, reports some relief. Pt denies CP, tightness, heaviness at this time. Pt was started on Clonidine 2 days ago. Pt reports starting to feel sluggish and lightheaded yesterday afternoon.

## 2020-07-07 NOTE — ED Notes (Signed)
Pt had 81 mg ASA this morning.

## 2020-07-07 NOTE — ED Notes (Signed)
Covid Swab obtained and to the lab 

## 2020-07-07 NOTE — ED Provider Notes (Signed)
[facility] EMERGENCY DEPARTMENT Provider Note   CSN: [id number] Arrival date & time: 07/07/20  1726     History Chief Complaint  Patient presents with  . Chest Pain    BARUC TUGWELL is a 79 y.o. male.   Chest Pain Pain location:  Substernal area Pain quality comment:  Unable to specify Pain radiates to:  Neck and L shoulder Pain severity:  Moderate Onset quality:  Sudden Timing:  Rare Progression:  Resolved Context comment:  Climbing stairs Relieved by:  Nitroglycerin Worsened by:  Nothing Ineffective treatments:  None tried Associated symptoms: diaphoresis and shortness of breath   Associated symptoms: no back pain, no cough, no fever, no headache, no nausea, no near-syncope, no palpitations and no vomiting   Risk factors: coronary artery disease        Past Medical History:  Diagnosis Date  . 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 Arthritis    SHOULDERS, NECK , RIGHT HIP  . Bladder cancer ([place]) DX   OCT 2011--  FOLLOWED BY DR WOODRUFF   S/P TURBT,  HIGH GRADE BLADDER CANCER  . CAD (coronary artery disease), native coronary artery 08/30/2018   normal Left main, 30% stenosis ostial LAD, 50% stenosis OM 3, 80% stenosis mid RCA;  2.5 x 8 mm Tetra Stent Dr. Doylene Canard  . Coronary artery disease CARDIOLOGIST- DR Wynonia Lawman-  LAST VISIT 2 WKS AGO-- WILL REQUEST NOTE AND EKG   STRESS TEST -- JULY 2009  . Essential hypertension 08/30/2018  . Hyperlipidemia   . Hypertension   . Nocturia   . Personal history of malignant neoplasm of bladder 08/30/2018  . Post PTCA 2001-   X1 STENT TO RCA   AND 1996    Patient Active Problem List   Diagnosis Date Noted  . Bradycardia 07/07/2020  . CAD (coronary artery disease), native coronary artery 08/30/2018  . Hyperlipidemia 08/30/2018  . Essential hypertension 08/30/2018  . 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 Personal history of malignant neoplasm of bladder 08/30/2018    Past Surgical History:  Procedure  Laterality Date  . CORONARY ANGIOPLASTY  1996  . CORONARY ANGIOPLASTY WITH STENT PLACEMENT  2001   STENT TO RCA  . CYSTO/ BLADDER BX  10-22-10  . CYSTOSCOPY W/ RETROGRADES  11/12/2011   Procedure: CYSTOSCOPY WITH RETROGRADE PYELOGRAM;  Surgeon: Molli Hazard, MD;  Location: [facility];  Service: Urology;  Laterality: Bilateral;  . LUMBAR FUSION    . TRANSURETHRAL RESECTION OF BLADDER TUMOR  08-27-10   RIGHT ANTERIOR DOME  . TRANSURETHRAL RESECTION OF BLADDER TUMOR  11/12/2011   Procedure: TRANSURETHRAL RESECTION OF BLADDER TUMOR (TURBT);  Surgeon: Molli Hazard, MD;  Location: [facility];  Service: Urology;  Laterality: N/A;  with PK Gyrus c-arm gyrus  digital ureteroscope       History reviewed. No pertinent family history.  Social History   Tobacco Use  . Smoking status: Former Smoker    Packs/day: 1.50    Types: Cigarettes    Quit date: 11/05/1965    Years since quitting: 54.7  . Smokeless tobacco: Never Used  Substance Use Topics  . Alcohol use: No  . Drug use: No    Home Medications Prior to Admission medications   Medication Sig Start Date End Date Taking? Authorizing Provider  aspirin 81 MG tablet Take 1 tablet (81 mg total) by mouth daily. 11/16/11   Rolan Bucco, MD  atorvastatin (LIPITOR) 10 MG tablet Take 1 tablet by mouth once  daily Patient not taking: Reported on 04/11/2020 06/07/19   Revankar, Reita Cliche, MD  chlorthalidone (HYGROTON) 25 MG tablet Take 0.5 tablets (12.5 mg total) by mouth daily. Patient not taking: Reported on 04/11/2020 09/30/18   Revankar, Reita Cliche, MD  Coenzyme Q10 (CO Q-10) 200 MG CAPS Take 200 mg by mouth daily. 10/29/18   Revankar, Reita Cliche, MD  Evolocumab with Infusor 420 MG/3.5ML SOCT Inject 1 ampule into the skin every 30 (thirty) days.    [provider]  nitroGLYCERIN (NITROSTAT) 0.4 MG SL tablet Place 1 tablet (0.4 mg total) under the tongue every 5 (five) minutes as needed.  09/30/18 12/29/18  Revankar, Reita Cliche, MD  telmisartan-hydrochlorothiazide (MICARDIS HCT) 80-25 MG tablet Take 1 tablet by mouth once daily 05/13/19   Revankar, Reita Cliche, MD  vitamin E 400 UNIT capsule Take 400 Units by mouth daily.    [provider]    Allergies    Norvasc [amlodipine besylate]  Review of Systems   Review of Systems  Constitutional: Positive for diaphoresis. Negative for chills and fever.  HENT: Negative for congestion and rhinorrhea.   Respiratory: Positive for shortness of breath. Negative for cough.   Cardiovascular: Positive for chest pain. Negative for palpitations and near-syncope.  Gastrointestinal: Negative for diarrhea, nausea and vomiting.  Genitourinary: Negative for difficulty urinating and dysuria.  Musculoskeletal: Negative for arthralgias and back pain.  Skin: Negative for color change and rash.  Neurological: Negative for light-headedness and headaches.    Physical Exam Updated Vital Signs BP (!) 202/75   Pulse (!) 40   Temp 97.9 F (36.6 C) (Oral)   Resp 15   Ht 5\' 9"  (1.753 m)   Wt 72.6 kg   SpO2 100%   BMI 23.63 kg/m   Physical Exam Vitals and nursing note reviewed.  Constitutional:      General: He is not in acute distress.    Appearance: Normal appearance.  HENT:     Head: Normocephalic and atraumatic.     Nose: No rhinorrhea.  Eyes:     General:        Right eye: No discharge.        Left eye: No discharge.     Conjunctiva/sclera: Conjunctivae normal.  Cardiovascular:     Rate and Rhythm: Regular rhythm. Bradycardia present.  Pulmonary:     Effort: Pulmonary effort is normal.     Breath sounds: No stridor. No decreased breath sounds or wheezing.  Abdominal:     General: Abdomen is flat. There is no distension.     Palpations: Abdomen is soft.  Musculoskeletal:        General: No deformity or signs of injury.     Right lower leg: No edema.     Left lower leg: No edema.  Skin:    General: Skin is warm and dry.    Neurological:     General: No focal deficit present.     Mental Status: He is alert. Mental status is at baseline.     Motor: No weakness.  Psychiatric:        Mood and Affect: Mood normal.        Behavior: Behavior normal.        Thought Content: Thought content normal.     ED Results / Procedures / Treatments   Labs (all labs ordered are listed, but only abnormal results are displayed) Labs Reviewed  BASIC METABOLIC PANEL - Abnormal; Notable for the following components:  Result Value   Glucose, Bld 107 (*)    BUN 32 (*)    Calcium 8.7 (*)    All other components within normal limits  CBC - Abnormal; Notable for the following components:   Platelets 113 (*)    All other components within normal limits  SARS CORONAVIRUS 2 BY RT PCR (HOSPITAL ORDER, [facility])  TROPONIN I (HIGH SENSITIVITY)  TROPONIN I (HIGH SENSITIVITY)  TROPONIN I (HIGH SENSITIVITY)  TROPONIN I (HIGH SENSITIVITY)    EKG EKG Interpretation  Date/Time:  Saturday July 07 2020 17:45:51 EDT Ventricular Rate:  41 PR Interval:  184 QRS Duration: 90 QT Interval:  496 QTC Calculation: 409 R Axis:   37 Text Interpretation: Marked sinus bradycardia Abnormal ECG Confirmed by Dewaine Conger [phone]) on 07/07/2020 6:42:22 PM   Radiology DG Chest 2 View  Result Date: 07/07/2020 CLINICAL DATA:  Chest pain EXAM: CHEST - 2 VIEW COMPARISON:  12/19/2013 chest radiograph. FINDINGS: Stable cardiomediastinal silhouette with normal heart size. No pneumothorax. No pleural effusion. Lungs appear clear, with no acute consolidative airspace disease and no pulmonary edema. IMPRESSION: No active cardiopulmonary disease. Electronically Signed   By: Ilona Sorrel M.D.   On: 07/07/2020 19:16    Procedures Procedures (including critical care time)  Medications Ordered in ED Medications  aspirin chewable tablet 324 mg (243 mg Oral Given 07/07/20 1831)    ED Course  I have reviewed the triage  vital signs and the nursing notes.  Pertinent labs & imaging results that were available during my care of the patient were reviewed by me and considered in my medical decision making (see chart for details).    MDM Rules/Calculators/A&P                          History of ACS status post stent placement years ago.  Had chest pain that reminded him of that.  Upon arrival her EKG that showed sinus bradycardia with no acute ischemic change interval abnormality or arrhythmia.  I spoke with Dr. Tamala Julian from cardiology agrees no need for acute STEMI intervention.  Nitroglycerin did relieve the pain before he came here.  He is mildly hypertensive.  Aspirin is given a full dose.  First troponin is 12.  Other labs are unremarkable.  He will likely need admission for further provocative testing.  No risk factors other than age for PE and with the symptoms I do not feel he needs work-up for that.  Low likelihood of dissection or other acute life-threatening illness.  No fever or infectious signs or symptoms.  Second troponin is also 12.  He is well-appearing resting comfortably.  Having intermittent ectopic beats that we have been unable to capture on EKG.  I spoke to the hospitalist about possible need for admission for further testing and they agree.  Unspecified origin of chest pain.  May be related to new antihypertensives, patient is on multiple classes of antihypertensives to include ACEs ARB HCTZ chlorthalidone metoprolol and clonidine.  He is managed by primary care provider perhaps needs consultation by cardiology versus nephrology. Final Clinical Impression(s) / ED Diagnoses Final diagnoses:  Other chest pain    Rx / DC Orders ED Discharge Orders    None       Breck Coons, MD 07/07/20 2246

## 2020-07-07 NOTE — ED Notes (Signed)
ED Provider at bedside. 

## 2020-07-08 ENCOUNTER — Observation Stay (HOSPITAL_COMMUNITY): Payer: Medicare Other

## 2020-07-08 DIAGNOSIS — Z20822 Contact with and (suspected) exposure to covid-19: Secondary | ICD-10-CM | POA: Diagnosis not present

## 2020-07-08 DIAGNOSIS — R079 Chest pain, unspecified: Secondary | ICD-10-CM | POA: Diagnosis not present

## 2020-07-08 DIAGNOSIS — I2510 Atherosclerotic heart disease of native coronary artery without angina pectoris: Secondary | ICD-10-CM

## 2020-07-08 DIAGNOSIS — R001 Bradycardia, unspecified: Secondary | ICD-10-CM | POA: Diagnosis not present

## 2020-07-08 DIAGNOSIS — Z87891 Personal history of nicotine dependence: Secondary | ICD-10-CM | POA: Diagnosis not present

## 2020-07-08 DIAGNOSIS — E785 Hyperlipidemia, unspecified: Secondary | ICD-10-CM

## 2020-07-08 DIAGNOSIS — Z955 Presence of coronary angioplasty implant and graft: Secondary | ICD-10-CM | POA: Diagnosis not present

## 2020-07-08 DIAGNOSIS — I10 Essential (primary) hypertension: Secondary | ICD-10-CM

## 2020-07-08 DIAGNOSIS — Z79899 Other long term (current) drug therapy: Secondary | ICD-10-CM | POA: Diagnosis not present

## 2020-07-08 DIAGNOSIS — Z8551 Personal history of malignant neoplasm of bladder: Secondary | ICD-10-CM | POA: Diagnosis not present

## 2020-07-08 DIAGNOSIS — Z7982 Long term (current) use of aspirin: Secondary | ICD-10-CM | POA: Diagnosis not present

## 2020-07-08 DIAGNOSIS — I248 Other forms of acute ischemic heart disease: Secondary | ICD-10-CM | POA: Diagnosis not present

## 2020-07-08 DIAGNOSIS — R0789 Other chest pain: Secondary | ICD-10-CM | POA: Diagnosis present

## 2020-07-08 DIAGNOSIS — I714 Abdominal aortic aneurysm, without rupture: Secondary | ICD-10-CM | POA: Diagnosis not present

## 2020-07-08 DIAGNOSIS — I25110 Atherosclerotic heart disease of native coronary artery with unstable angina pectoris: Secondary | ICD-10-CM | POA: Diagnosis not present

## 2020-07-08 LAB — BASIC METABOLIC PANEL
Anion gap: 10 (ref 5–15)
BUN: 25 mg/dL — ABNORMAL HIGH (ref 8–23)
CO2: 26 mmol/L (ref 22–32)
Calcium: 9.2 mg/dL (ref 8.9–10.3)
Chloride: 104 mmol/L (ref 98–111)
Creatinine, Ser: 0.81 mg/dL (ref 0.61–1.24)
GFR calc Af Amer: >60 mL/min (ref >60)
GFR calc non Af Amer: >60 mL/min (ref >60)
Glucose, Bld: 116 mg/dL — ABNORMAL HIGH (ref 70–99)
Potassium: 3.5 mmol/L (ref 3.5–5.1)
Sodium: 140 mmol/L (ref 135–145)

## 2020-07-08 LAB — URINALYSIS, ROUTINE W REFLEX MICROSCOPIC
Bilirubin Urine: NEGATIVE
Glucose, UA: NEGATIVE mg/dL
Hgb urine dipstick: NEGATIVE
Ketones, ur: NEGATIVE mg/dL
Leukocytes,Ua: NEGATIVE
Nitrite: NEGATIVE
Protein, ur: NEGATIVE mg/dL
Specific Gravity, Urine: 1.012 (ref 1.005–1.030)
pH: 7 (ref 5.0–8.0)

## 2020-07-08 LAB — CBC
HCT: 45.7 % (ref 39.0–52.0)
Hemoglobin: 14.9 g/dL (ref 13.0–17.0)
MCH: 28.6 pg (ref 26.0–34.0)
MCHC: 32.6 g/dL (ref 30.0–36.0)
MCV: 87.7 fL (ref 80.0–100.0)
Platelets: 130 10*3/uL — ABNORMAL LOW (ref 150–400)
RBC: 5.21 MIL/uL (ref 4.22–5.81)
RDW: 12.4 % (ref 11.5–15.5)
WBC: 6.9 10*3/uL (ref 4.0–10.5)
nRBC: 0 % (ref 0.0–0.2)

## 2020-07-08 LAB — ECHOCARDIOGRAM COMPLETE
Area-P 1/2: 2.2 cm2
Height: 69 in
P 1/2 time: 1925 msec
S' Lateral: 3.2 cm
Weight: 2560 oz

## 2020-07-08 LAB — PROTIME-INR
INR: 1 (ref 0.8–1.2)
Prothrombin Time: 12.6 seconds (ref 11.4–15.2)

## 2020-07-08 LAB — HEPARIN LEVEL (UNFRACTIONATED): Heparin Unfractionated: 0.4 IU/mL (ref 0.30–0.70)

## 2020-07-08 LAB — APTT: aPTT: 32 seconds (ref 24–36)

## 2020-07-08 LAB — MAGNESIUM: Magnesium: 2.3 mg/dL (ref 1.7–2.4)

## 2020-07-08 MED ORDER — HEPARIN (PORCINE) 25000 UT/250ML-% IV SOLN
900.0000 [IU]/h | INTRAVENOUS | Status: DC
  Administered 2020-07-08 – 2020-07-09 (×2): 900 [IU]/h via INTRAVENOUS
  Filled 2020-07-08 (×2): qty 250

## 2020-07-08 MED ORDER — HYDRALAZINE HCL 25 MG PO TABS
25.0000 mg | ORAL_TABLET | Freq: Three times a day (TID) | ORAL | Status: DC
  Administered 2020-07-08 – 2020-07-10 (×6): 25 mg via ORAL
  Filled 2020-07-08 (×6): qty 1

## 2020-07-08 MED ORDER — HEPARIN BOLUS VIA INFUSION
4000.0000 [IU] | INTRAVENOUS | Status: AC
  Administered 2020-07-08: 4000 [IU] via INTRAVENOUS
  Filled 2020-07-08: qty 4000

## 2020-07-08 MED ORDER — HYDROCHLOROTHIAZIDE 12.5 MG PO CAPS
12.5000 mg | ORAL_CAPSULE | Freq: Every day | ORAL | Status: DC
  Administered 2020-07-08 – 2020-07-10 (×3): 12.5 mg via ORAL
  Filled 2020-07-08 (×3): qty 1

## 2020-07-08 MED ORDER — IRBESARTAN 300 MG PO TABS
300.0000 mg | ORAL_TABLET | Freq: Every day | ORAL | Status: DC
  Administered 2020-07-08 – 2020-07-10 (×3): 300 mg via ORAL
  Filled 2020-07-08 (×3): qty 1

## 2020-07-08 MED ORDER — POTASSIUM CHLORIDE CRYS ER 20 MEQ PO TBCR
40.0000 meq | EXTENDED_RELEASE_TABLET | Freq: Once | ORAL | Status: AC
  Administered 2020-07-08: 40 meq via ORAL
  Filled 2020-07-08: qty 2

## 2020-07-08 MED ORDER — ACETAMINOPHEN 325 MG PO TABS
650.0000 mg | ORAL_TABLET | ORAL | Status: DC | PRN
  Administered 2020-07-09: 650 mg via ORAL
  Filled 2020-07-08: qty 2

## 2020-07-08 MED ORDER — ASPIRIN EC 81 MG PO TBEC
81.0000 mg | DELAYED_RELEASE_TABLET | Freq: Every day | ORAL | Status: DC
  Administered 2020-07-08 – 2020-07-10 (×3): 81 mg via ORAL
  Filled 2020-07-08 (×3): qty 1

## 2020-07-08 MED ORDER — ONDANSETRON HCL 4 MG/2ML IJ SOLN: 4.0000 mg | Freq: Four times a day (QID) | INTRAMUSCULAR | Status: DC | PRN

## 2020-07-08 MED ORDER — ISOSORBIDE MONONITRATE ER 30 MG PO TB24
30.0000 mg | ORAL_TABLET | Freq: Every day | ORAL | Status: DC
  Administered 2020-07-08 – 2020-07-09 (×2): 30 mg via ORAL
  Filled 2020-07-08 (×2): qty 1

## 2020-07-08 MED ORDER — ENOXAPARIN SODIUM 40 MG/0.4ML ~~LOC~~ SOLN
40.0000 mg | SUBCUTANEOUS | Status: DC
  Filled 2020-07-08: qty 0.4

## 2020-07-08 MED ORDER — ROSUVASTATIN CALCIUM 10 MG PO TABS: 10.0000 mg | ORAL_TABLET | ORAL | Status: DC

## 2020-07-08 MED ORDER — HYDRALAZINE HCL 20 MG/ML IJ SOLN
10.0000 mg | INTRAMUSCULAR | Status: DC | PRN
  Administered 2020-07-08: 20 mg via INTRAVENOUS
  Administered 2020-07-09 (×2): 10 mg via INTRAVENOUS
  Filled 2020-07-08: qty 1

## 2020-07-08 MED ORDER — TELMISARTAN-HCTZ 80-12.5 MG PO TABS: 1.0000 | ORAL_TABLET | Freq: Every morning | ORAL | Status: DC

## 2020-07-08 MED ORDER — ROSUVASTATIN CALCIUM 10 MG PO TABS: 10.0000 mg | ORAL_TABLET | Freq: Every day | ORAL | Status: DC

## 2020-07-08 NOTE — Progress Notes (Signed)
  Echocardiogram 2D Echocardiogram has been performed.  Quinntin Malter G Zacory Fiola 07/08/2020, 1:20 PM

## 2020-07-08 NOTE — Progress Notes (Addendum)
ANTICOAGULATION CONSULT NOTE - Initial Consult  Pharmacy Consult for IV heparin Indication: chest pain/ACS  Allergies  Allergen Reactions  . Norvasc [Amlodipine Besylate] Other (See Comments)    CAUSES TEETH TO FALL OUT    Patient Measurements: Height: 5\' 9"  (175.3 cm) Weight: 72.6 kg (160 lb) IBW/kg (Calculated) : 70.7 Heparin Dosing Weight: actual body weight   Vital Signs: Temp: 98 F (36.7 C) (08/22 0810) Temp Source: Oral (08/22 0810) BP: 193/70 (08/22 0810) Pulse Rate: 61 (08/22 0810)  Labs: Recent Labs    07/07/20 1801 07/07/20 1936 07/07/20 2253 07/08/20 0824  HGB 13.1  --   --   --   HCT 40.1  --   --   --   PLT 113*  --   --   --   CREATININE 1.01  --   --  0.81  TROPONINIHS 12 12 16   --     Estimated Creatinine Clearance: 73.9 mL/min (by C-G formula based on SCr of 0.81 mg/dL).   Medical History: Past Medical History:  Diagnosis Date  . 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 Arthritis    SHOULDERS, NECK , RIGHT HIP  . Bladder cancer ([place]) DX   OCT 2011--  FOLLOWED BY DR WOODRUFF   S/P TURBT,  HIGH GRADE BLADDER CANCER  . CAD (coronary artery disease), native coronary artery 08/30/2018   normal Left main, 30% stenosis ostial LAD, 50% stenosis OM 3, 80% stenosis mid RCA;  2.5 x 8 mm Tetra Stent Dr. Doylene Canard  . Coronary artery disease CARDIOLOGIST- DR Wynonia Lawman-  LAST VISIT 2 WKS AGO-- WILL REQUEST NOTE AND EKG   STRESS TEST -- JULY 2009  . Essential hypertension 08/30/2018  . Hyperlipidemia   . Hypertension   . Nocturia   . Personal history of malignant neoplasm of bladder 08/30/2018  . Post PTCA 2001-   X1 STENT TO RCA   AND 1996    Assessment: 75 y/oM with PMH of CAD s/p stent, HTN, AAA, and bladder cancer s/p TURBT who presented with left sided chest pain with SOB and radiation to left jaw and arm. Pharmacy consulted for IV heparin dosing for ACS. Patient on ASA 81mg  PO daily PTA, but not on any anticoagulants. CBC on admission: H/H WNL, Pltc low  at 113K.   Goal of Therapy:  Heparin level 0.3-0.7 units/ml Monitor platelets by anticoagulation protocol: Yes   Plan:   D/C enoxaparin 40mg  SQ q24h (no doses given yet)  aPTT, PT/INR, CBC now   Heparin 4000 units IV bolus x 1, then 900 units/hr  Heparin level 8 hours after initiation   Daily CBC, heparin level  Monitor closely for s/sx of bleeding  Per Cardiology, plan for cardiac cath in AM    Lindell Spar, PharmD, BCPS Clinical Pharmacist  07/08/2020,10:17 AM

## 2020-07-08 NOTE — H&P (Signed)
History and Physical    Ricardo [name] CZY:[id number] DOB: 04-Mar-1941 DOA: 07/07/2020  PCP: Maury Dus, MD  Patient coming from: Home  I have personally briefly reviewed patient's old medical records in [place]  Chief Complaint: CP  HPI: Ricardo [name] is a 79 y.o. male with medical history significant of CAD s/p stent, HTN, HLD, AAA (3.6cm in Oct 2020), bladder CA s/p TURBT.  Pt presents to the ED at [facility] with c/o CP.  Onset suddenly about 5pm.  Took 1 outdated SL NTG and CP resolved.  Had SOB with CP.  CP left sided radiated to L jaw and L arm.  PT concerned since this felt tlike CP he had in past requiring cardiac stent.  Pt recently started switched from clonidine patch to PO for his HTN.  ED Course: Remains CP free.  HR brady in the low 40s.  EKG reads as junctional but I see P waves (I think S.Ricardo [name]).  BP remains running high.  Trops neg.  Pt transferred to [facility] for CP r/o.   Review of Systems: As per HPI, otherwise all review of systems negative.  Past Medical History:  Diagnosis Date  . 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 Arthritis    SHOULDERS, NECK , RIGHT HIP  . Bladder cancer ([place]) DX   OCT 2011--  FOLLOWED BY DR WOODRUFF   S/P TURBT,  HIGH GRADE BLADDER CANCER  . CAD (coronary artery disease), native coronary artery 08/30/2018   normal Left main, 30% stenosis ostial LAD, 50% stenosis OM 3, 80% stenosis mid RCA;  2.5 x 8 mm Tetra Stent Dr. Doylene Canard  . Coronary artery disease CARDIOLOGIST- DR Wynonia Lawman-  LAST VISIT 2 WKS AGO-- WILL REQUEST NOTE AND EKG   STRESS TEST -- JULY 2009  . Essential hypertension 08/30/2018  . Hyperlipidemia   . Hypertension   . Nocturia   . Personal history of malignant neoplasm of bladder 08/30/2018  . Post PTCA 2001-   X1 STENT TO RCA   AND 1996    Past Surgical History:  Procedure Laterality Date  . CORONARY ANGIOPLASTY  1996  . CORONARY ANGIOPLASTY WITH STENT PLACEMENT  2001   STENT TO RCA  . CYSTO/ BLADDER BX   10-22-10  . CYSTOSCOPY W/ RETROGRADES  11/12/2011   Procedure: CYSTOSCOPY WITH RETROGRADE PYELOGRAM;  Surgeon: Molli Hazard, MD;  Location: [facility];  Service: Urology;  Laterality: Bilateral;  . LUMBAR FUSION    . TRANSURETHRAL RESECTION OF BLADDER TUMOR  08-27-10   RIGHT ANTERIOR DOME  . TRANSURETHRAL RESECTION OF BLADDER TUMOR  11/12/2011   Procedure: TRANSURETHRAL RESECTION OF BLADDER TUMOR (TURBT);  Surgeon: Molli Hazard, MD;  Location[facility];  Service: Urology;  Laterality: N/A;  with PK Gyrus c-arm gyrus  digital ureteroscope     reports that he quit smoking about 54 years ago. His smoking use included cigarettes. He smoked 1.50 packs per day. He has never used smokeless tobacco. He reports that he does not drink alcohol and does not use drugs.  Allergies  Allergen Reactions  . Norvasc [Amlodipine Besylate] Other (See Comments)    CAUSES TEETH TO FALL OUT    History reviewed. No pertinent family history. No sick contacts  Prior to Admission medications   Medication Sig Start Date End Date Taking? Authorizing Provider  aspirin 81 MG tablet Take 1 tablet (81 mg total) by mouth daily. 11/16/11  Yes Rolan Bucco, MD  chlorthalidone (HYGROTON) 25 MG  tablet Take 0.5 tablets (12.5 mg total) by mouth daily. 09/30/18  Yes Revankar, Reita Cliche, MD  cloNIDine (CATAPRES) 0.2 MG tablet Take 0.2 mg by mouth 2 (two) times daily. 07/05/20  Yes [provider]  Coenzyme Q10 (CO Q-10) 200 MG CAPS Take 200 mg by mouth daily. 10/29/18  Yes Revankar, Reita Cliche, MD  metoprolol succinate (TOPROL-XL) 25 MG 24 hr tablet Take 25 mg by mouth every morning. 06/11/20  Yes [provider]  nitroGLYCERIN (NITROSTAT) 0.4 MG SL tablet Place 1 tablet (0.4 mg total) under the tongue every 5 (five) minutes as needed. 09/30/18 07/08/21 Yes Revankar, Reita Cliche, MD  REPATHA SURECLICK 510 MG/ML SOAJ Inject 1 mL into the skin every 30 (thirty) days.  06/11/20  Yes [provider]  rosuvastatin (CRESTOR) 10 MG tablet Take 10 mg by mouth once a week. 06/11/20  Yes [provider]  telmisartan-hydrochlorothiazide (MICARDIS HCT) 80-12.5 MG tablet Take 1 tablet by mouth every morning. 06/11/20  Yes [provider]  vitamin E 400 UNIT capsule Take 400 Units by mouth daily.    [provider]    Physical Exam: Vitals:   07/08/20 0130 07/08/20 0215 07/08/20 0227 07/08/20 0356  BP: (!) 183/67 (!) 179/70  (!) 189/83  Pulse: (!) 40 (!) 43  (!) 41  Resp: 15 18    Temp:   97.8 F (36.6 C)   TempSrc:   Oral   SpO2: 97% 99%  100%  Weight:      Height:        Constitutional: NAD, calm, comfortable Eyes: PERRL, lids and conjunctivae normal ENMT: Mucous membranes are moist. Posterior pharynx clear of any exudate or lesions.Normal dentition.  Neck: normal, supple, no masses, no thyromegaly Respiratory: clear to auscultation bilaterally, no wheezing, no crackles. Normal respiratory effort. No accessory muscle use.  Cardiovascular: Regular rate and rhythm, no murmurs / rubs / gallops. No extremity edema. 2+ pedal pulses. No carotid bruits.  Abdomen: no tenderness, no masses palpated. No hepatosplenomegaly. Bowel sounds positive.  Musculoskeletal: no clubbing / cyanosis. No joint deformity upper and lower extremities. Good ROM, no contractures. Normal muscle tone.  Skin: no rashes, lesions, ulcers. No induration Neurologic: CN 2-12 grossly intact. Sensation intact, DTR normal. Strength 5/5 in all 4.  Psychiatric: Normal judgment and insight. Alert and oriented x 3. Normal mood.    Labs on Admission: I have personally reviewed following labs and imaging studies  CBC: Recent Labs  Lab 07/07/20 1801  WBC 5.9  HGB 13.1  HCT 40.1  MCV 88.3  PLT 258*   Basic Metabolic Panel: Recent Labs  Lab 07/07/20 1801  NA 137  K 3.7  CL 103  CO2 23  GLUCOSE 107*  BUN 32*  CREATININE 1.01  CALCIUM 8.7*    GFR: Estimated Creatinine Clearance: 59.3 mL/min (by C-G formula based on SCr of 1.01 mg/dL). Liver Function Tests: No results for input(s): AST, ALT, ALKPHOS, BILITOT, PROT, ALBUMIN in the last 168 hours. No results for input(s): LIPASE, AMYLASE in the last 168 hours. No results for input(s): AMMONIA in the last 168 hours. Coagulation Profile: No results for input(s): INR, PROTIME in the last 168 hours. Cardiac Enzymes: No results for input(s): CKTOTAL, CKMB, CKMBINDEX, TROPONINI in the last 168 hours. BNP (last 3 results) No results for input(s): PROBNP in the last 8760 hours. HbA1C: No results for input(s): HGBA1C in the last 72 hours. CBG: No results for input(s): GLUCAP in the last 168 hours. Lipid Profile: No  results for input(s): CHOL, HDL, LDLCALC, TRIG, CHOLHDL, LDLDIRECT in the last 72 hours. Thyroid Function Tests: No results for input(s): TSH, T4TOTAL, FREET4, T3FREE, THYROIDAB in the last 72 hours. Anemia Panel: No results for input(s): VITAMINB12, FOLATE, FERRITIN, TIBC, IRON, RETICCTPCT in the last 72 hours. Urine analysis: No results found for: COLORURINE, APPEARANCEUR, [place], [place], GLUCOSEU, [place], BILIRUBINUR, KETONESUR, PROTEINUR, UROBILINOGEN, NITRITE, LEUKOCYTESUR  Radiological Exams on Admission: DG Chest 2 View  Result Date: 07/07/2020 CLINICAL DATA:  Chest pain EXAM: CHEST - 2 VIEW COMPARISON:  12/19/2013 chest radiograph. FINDINGS: Stable cardiomediastinal silhouette with normal heart size. No pneumothorax. No pleural effusion. Lungs appear clear, with no acute consolidative airspace disease and no pulmonary edema. IMPRESSION: No active cardiopulmonary disease. Electronically Signed   By: Ilona Sorrel M.D.   On: 07/07/2020 19:16    EKG: Independently reviewed.  Assessment/Plan Principal Problem:   Chest pain, rule out acute myocardial infarction Active Problems:   CAD (coronary artery disease), native coronary artery   Essential hypertension    Bradycardia    1. CP r/o - 1. CP obs pathway 2. Serial trops 3. Tele monitor 4. NPO 5. Call Dr. Doylene Canard in AM (looks like Dr. Doylene Canard did his stent). 6. CP free right now 2. Bradycardia - 1. Due to HTN meds? 2. Holding clonidine and metoprolol 3. Tele monitor 4. Asymptomatic at the moment. 3. HTN - 1. Cont ARB, thiazide diuretics 2. Holding clonidine and metoprolol due to bradycardia 3. PRN hydralazine ordered 4. CAD - 1. Cont ASA 5. HLD - 1. Cont statin weekly 2. On repatha, not due for this yet  DVT prophylaxis: Lovenox Code Status: Full Family Communication: No family in room Disposition Plan: Home after CP r/o Consults called: None, call Dr. Doylene Canard in AM Admission status: Place in [place], [place] [facility]  How to contact the [facility] Attending or Consulting provider [place] or covering provider during after hours [place], for this patient?  1. Check the care team in [facility] and look for a) attending/consulting [facility] provider listed and b) the [facility] team listed 2. Log into [email or web address]  Amion Physician Scheduling and messaging for groups and whole hospitals  On call and physician scheduling software for group practices, residents, hospitalists and other medical providers for call, clinic, rotation and shift schedules. On[employer] is a hospital-wide system for scheduling doctors and paging doctors on call. EasyPlot is for scientific plotting and data analysis.  [email or web address]  and use 's universal password to access. If you do not have the password, please contact the hospital operator.  3. Locate the [facility] provider you are looking for under [facility] and page to a number that you can be directly reached. 4. If you still have difficulty reaching the provider, please page the [facility] (Director on Call) for the Hospitalists listed on amion for assistance.  07/08/2020, 4:10 AM

## 2020-07-08 NOTE — H&P (View-Only) (Signed)
Referring Physician: Irine Seal, MD   Ricardo [name] is an 79 y.o. male.                       Chief Complaint: Chest pain  HPI: 79 years old male with PMH of CAD, s/p stent, HTN, Hyperlipidemia, AAA and bladder CA s/p TURBT had left sided chest pain with shortness of breath and radiation to left jaw and arm. EKG showed sinus bradycardia. Troponin I levels are minimally elevated. Chest x-ray is unremarkable.  Past Medical History:  Diagnosis Date  . 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 Arthritis    SHOULDERS, NECK , RIGHT HIP  . Bladder cancer ([place]) DX   OCT 2011--  FOLLOWED BY DR WOODRUFF   S/P TURBT,  HIGH GRADE BLADDER CANCER  . CAD (coronary artery disease), native coronary artery 08/30/2018   normal Left main, 30% stenosis ostial LAD, 50% stenosis OM 3, 80% stenosis mid RCA;  2.5 x 8 mm Tetra Stent Dr. Doylene Canard  . Coronary artery disease CARDIOLOGIST- DR Wynonia Lawman-  LAST VISIT 2 WKS AGO-- WILL REQUEST NOTE AND EKG   STRESS TEST -- JULY 2009  . Essential hypertension 08/30/2018  . Hyperlipidemia   . Hypertension   . Nocturia   . Personal history of malignant neoplasm of bladder 08/30/2018  . Post PTCA 2001-   X1 STENT TO RCA   AND 1996      Past Surgical History:  Procedure Laterality Date  . CORONARY ANGIOPLASTY  1996  . CORONARY ANGIOPLASTY WITH STENT PLACEMENT  2001   STENT TO RCA  . CYSTO/ BLADDER BX  10-22-10  . CYSTOSCOPY W/ RETROGRADES  11/12/2011   Procedure: CYSTOSCOPY WITH RETROGRADE PYELOGRAM;  Surgeon: Molli Hazard, MD;  Location: [facility];  Service: Urology;  Laterality: Bilateral;  . LUMBAR FUSION    . TRANSURETHRAL RESECTION OF BLADDER TUMOR  08-27-10   RIGHT ANTERIOR DOME  . TRANSURETHRAL RESECTION OF BLADDER TUMOR  11/12/2011   Procedure: TRANSURETHRAL RESECTION OF BLADDER TUMOR (TURBT);  Surgeon: Molli Hazard, MD;  Location: [facility];  Service: Urology;  Laterality: N/A;  with PK  Gyrus c-arm gyrus  digital ureteroscope    History reviewed. No pertinent family history. Social History:  reports that he quit smoking about 54 years ago. His smoking use included cigarettes. He smoked 1.50 packs per day. He has never used smokeless tobacco. He reports that he does not drink alcohol and does not use drugs.  Allergies:  Allergies  Allergen Reactions  . Norvasc [Amlodipine Besylate] Other (See Comments)    CAUSES TEETH TO FALL OUT    Medications Prior to Admission  Medication Sig Dispense Refill  . aspirin 81 MG tablet Take 1 tablet (81 mg total) by mouth daily. 1 tablet 0  . chlorthalidone (HYGROTON) 25 MG tablet Take 0.5 tablets (12.5 mg total) by mouth daily. 90 tablet 1  . cloNIDine (CATAPRES) 0.2 MG tablet Take 0.2 mg by mouth 2 (two) times daily.    . Coenzyme Q10 (CO Q-10) 200 MG CAPS Take 200 mg by mouth daily. 90 each 0  . metoprolol succinate (TOPROL-XL) 25 MG 24 hr tablet Take 25 mg by mouth every morning.    . nitroGLYCERIN (NITROSTAT) 0.4 MG SL tablet Place 1 tablet (0.4 mg total) under the tongue every 5 (five) minutes as needed. 25 tablet 11  . REPATHA SURECLICK 174 MG/ML SOAJ Inject 1 mL into the skin every 30 (thirty)  days.    . rosuvastatin (CRESTOR) 10 MG tablet Take 10 mg by mouth once a week.    . telmisartan-hydrochlorothiazide (MICARDIS HCT) 80-12.5 MG tablet Take 1 tablet by mouth every morning.      Results for orders placed or performed during the hospital encounter of 07/07/20 (from the past 48 hour(s))  Basic metabolic panel     Status: Abnormal   Collection Time: 07/07/20  6:01 PM  Result Value Ref Range   Sodium 137 135 - 145 mmol/L   Potassium 3.7 3.5 - 5.1 mmol/L   Chloride 103 98 - 111 mmol/L   CO2 23 22 - 32 mmol/L   Glucose, Bld 107 (H) 70 - 99 mg/dL    Comment: Glucose reference range applies only to samples taken after fasting for at least 8 hours.   BUN 32 (H) 8 - 23 mg/dL   Creatinine, Ser 1.01 0.61 - 1.24 mg/dL   Calcium  8.7 (L) 8.9 - 10.3 mg/dL   GFR calc non Af Amer >60 >60 mL/min   GFR calc Af Amer >60 >60 mL/min   Anion gap 11 5 - 15    Comment: Performed at [facility], [address] [place], [place] [address]  CBC     Status: Abnormal   Collection Time: 07/07/20  6:01 PM  Result Value Ref Range   WBC 5.9 4.0 - 10.5 K/uL   RBC 4.54 4.22 - 5.81 MIL/uL   Hemoglobin 13.1 13.0 - 17.0 g/dL   HCT 40.1 39 - 52 %   MCV 88.3 80.0 - 100.0 fL   MCH 28.9 26.0 - 34.0 pg   MCHC 32.7 30.0 - 36.0 g/dL   RDW 12.3 11.5 - 15.5 %   Platelets 113 (L) 150 - 400 K/uL    Comment: REPEATED TO VERIFY PLATELET COUNT CONFIRMED BY SMEAR Immature Platelet Fraction may be clinically indicated, consider ordering this additional test [id number]    nRBC 0.0 0.0 - 0.2 %    Comment: Performed at [facility], [address] [place], [place] [address]  Troponin I (High Sensitivity)     Status: None   Collection Time: 07/07/20  6:01 PM  Result Value Ref Range   Troponin I (High Sensitivity) 12 <18 ng/L    Comment: (NOTE) Elevated high sensitivity troponin I (hsTnI) values and significant  changes across serial measurements may suggest ACS but many other  chronic and acute conditions are known to elevate hsTnI results.  Refer to the "Links" section for chest pain algorithms and additional  guidance. Performed at [facility], [address] [place], [place] [address]   SARS Coronavirus 2 by RT PCR (hospital order, performed in [facility] hospital lab) Nasopharyngeal Nasopharyngeal Swab     Status: None   Collection Time: 07/07/20  7:36 PM   Specimen: Nasopharyngeal Swab  Result Value Ref Range   SARS Coronavirus 2 NEGATIVE NEGATIVE    Comment: (NOTE) SARS-CoV-2 target nucleic acids are NOT DETECTED.  The SARS-CoV-2 RNA is generally detectable in upper and lower respiratory specimens during the acute phase of infection. The lowest concentration of SARS-CoV-2 viral copies this assay  can detect is 250 copies / mL. A negative result does not preclude SARS-CoV-2 infection and should not be used as the sole basis for treatment or other patient management decisions.  A negative result may occur with improper specimen collection / handling, submission of specimen other than nasopharyngeal swab, presence of  viral mutation(s) within the areas targeted by this assay, and inadequate number of viral copies (<250 copies / mL). A negative result must be combined with clinical observations, patient history, and epidemiological information.  Fact Sheet for Patients:   StrictlyIdeas.no  Fact Sheet for Healthcare Providers: BankingDealers.co.za  This test is not yet approved or  cleared by the [place] FDA and has been authorized for detection and/or diagnosis of SARS-CoV-2 by FDA under an Emergency Use Authorization (EUA).  This EUA will remain in effect (meaning this test can be used) for the duration of the COVID-19 declaration under Section 564(b)(1) of the Act, 21 U.S.C. section 360bbb-3(b)(1), unless the authorization is terminated or revoked sooner.  Performed at [facility], [address] [place], [place] [address]   Troponin I (High Sensitivity)     Status: None   Collection Time: 07/07/20  7:36 PM  Result Value Ref Range   Troponin I (High Sensitivity) 12 <18 ng/L    Comment: (NOTE) Elevated high sensitivity troponin I (hsTnI) values and significant  changes across serial measurements may suggest ACS but many other  chronic and acute conditions are known to elevate hsTnI results.  Refer to the "Links" section for chest pain algorithms and additional  guidance. Performed at [facility], [address] [place], [place] [address]   Troponin I (High Sensitivity)     Status: None   Collection Time: 07/07/20 10:53 PM  Result Value Ref Range   Troponin I (High Sensitivity) 16 <18 ng/L     Comment: (NOTE) Elevated high sensitivity troponin I (hsTnI) values and significant  changes across serial measurements may suggest ACS but many other  chronic and acute conditions are known to elevate hsTnI results.  Refer to the "Links" section for chest pain algorithms and additional  guidance. Performed at [facility], [address] [place], [place] [address]   Basic metabolic panel     Status: Abnormal   Collection Time: 07/08/20  8:24 AM  Result Value Ref Range   Sodium 140 135 - 145 mmol/L   Potassium 3.5 3.5 - 5.1 mmol/L   Chloride 104 98 - 111 mmol/L   CO2 26 22 - 32 mmol/L   Glucose, Bld 116 (H) 70 - 99 mg/dL    Comment: Glucose reference range applies only to samples taken after fasting for at least 8 hours.   BUN 25 (H) 8 - 23 mg/dL   Creatinine, Ser 0.81 0.61 - 1.24 mg/dL   Calcium 9.2 8.9 - 10.3 mg/dL   GFR calc non Af Amer >60 >60 mL/min   GFR calc Af Amer >60 >60 mL/min   Anion gap 10 5 - 15    Comment: Performed at [facility], [place] [address], [place], [place] [address]  Magnesium     Status: None   Collection Time: 07/08/20  8:24 AM  Result Value Ref Range   Magnesium 2.3 1.7 - 2.4 mg/dL    Comment: Performed at [facility], [place] [address] [place], [place] [address]   DG Chest 2 View  Result Date: 07/07/2020 CLINICAL DATA:  Chest pain EXAM: CHEST - 2 VIEW COMPARISON:  12/19/2013 chest radiograph. FINDINGS: Stable cardiomediastinal silhouette with normal heart size. No pneumothorax. No pleural effusion. Lungs appear clear, with no acute consolidative airspace disease and no pulmonary edema. IMPRESSION: No active cardiopulmonary disease. Electronically Signed   By: Ilona Sorrel M.D.   On: 07/07/2020  19:16    Review Of Systems Constitutional: No fever, chills, weight loss or gain. Eyes: No vision change, wears glasses. No discharge or pain. Ears: No hearing loss, No tinnitus. Respiratory: No asthma, COPD,  pneumonias. No shortness of breath. No hemoptysis. Cardiovascular: Positive chest pain, no palpitation, leg edema. Gastrointestinal: No nausea, vomiting, diarrhea, constipation. No GI bleed. No hepatitis. Genitourinary: No dysuria, hematuria, kidney stone. No incontinance. H/O bladder cancer Neurological: No headache, stroke, seizures.  Psychiatry: No psych facility admission for anxiety, depression, suicide. No detox. Skin: No rash. Musculoskeletal: No joint pain, fibromyalgia. No neck pain, back pain. Lymphadenopathy: No lymphadenopathy. Hematology: No anemia or easy bruising.   Blood pressure (!) 193/70, pulse 61, temperature 98 F (36.7 C), temperature source Oral, resp. rate 19, height 5\' 9"  (1.753 m), weight 72.6 kg, SpO2 100 %. Body mass index is 23.63 kg/m. General appearance: alert, cooperative, appears stated age and no distress Head: Normocephalic, atraumatic. Eyes: Brown eyes, pink conjunctiva, corneas clear. PERRL, EOM's intact. Neck: No adenopathy, no carotid bruit, no JVD, supple, symmetrical, trachea midline and thyroid not enlarged. Resp: Clear to auscultation bilaterally. Cardio: Regular rate and rhythm, S1, S2 normal, II/VI systolic murmur, no click, rub or gallop GI: Soft, non-tender; bowel sounds normal; no organomegaly. Extremities: No edema, cyanosis or clubbing. Skin: Warm and dry.  Neurologic: Alert and oriented X 3, normal strength. Normal coordination and gait.  Assessment/Plan Acute coronary syndrome Abnormal HS-troponin I from demand ischemia CAD S/P RCA stent Hypertension Hyperlipidemia  Cardiac catheterization in AM. Patient understood procedure, alternatives and risks.  Time spent: Review of old records, Lab, x-rays, EKG, other cardiac tests, examination, discussion with patient over 70 minutes.  Birdie Riddle, MD  07/08/2020, 9:24 AM

## 2020-07-08 NOTE — Progress Notes (Signed)
PROGRESS NOTE    Ricardo [name]  YHC:[id number] DOB: Apr 01, 1941 DOA: 07/07/2020 PCP: Maury Dus, MD    Chief Complaint  Patient presents with   Chest Pain    Brief Narrative:  Patient 79 year old gentleman history of coronary artery disease status post stent, hypertension, hyperlipidemia, AAA, bladder cancer status post TURBT presented to the ED with substernal chest pain radiating to the left jaw, left upper extremity with improvement with sublingual nitroglycerin.  Patient admitted for chest pain evaluation.  2D echo ordered.  Cardiology consulted.  Patient tentatively scheduled for cardiac catheterization 07/09/2020.   Assessment & Plan:   Principal Problem:   Chest pain, rule out acute myocardial infarction Active Problems:   CAD (coronary artery disease), native coronary artery   Essential hypertension   Bradycardia  1 chest pain Patient 79 year old gentleman, history of coronary artery disease status post stent, hyperlipidemia, hypertension, presenting with left-sided stump sternal chest pain radiating to the left jaw and left upper extremity concerning for ischemia.  Cardiac enzymes high-sensitivity troponin at 12 and 16.  2D echo ordered and pending.  Check a fasting lipid panel.  Continue home regimen high-grade trauma, Avapro, Microzide, Crestor.  Will start patient on hydralazine 25 mg p.o. every 8 hours for better blood pressure control.  Imdur added to patient's regimen per cardiology.  Patient seen in consultation by cardiology who are recommending cardiac catheterization tomorrow.  Per cardiology.  2.  Hyperlipidemia Check a fasting lipid panel.  Statin.  3.  Hypertension Patient stated recently started on clonidine however unable to tolerate side effects of dizziness, lightheadedness, now feels it is affecting his kidneys.  Clonidine held during this hospitalization.  Continue Avapro, Microzide, hydrocodone.  Start hydralazine 25 mg p.o. every 8 hours.  Imdur  started per cardiology.  Follow.  4.  Bradycardia Questionable etiology.  Concern may be secondary to antihypertensive medications.  Patient noted to have recently been started on clonidine.  Patient also noted to be on Metroprolol.  Clonidine and metoprolol on hold.  Monitor.  Cardiology consulted.  5.  Coronary artery disease status post stent to RCA See problem #1.  Continue aspirin, statin, ARB, diuretic.  Imdur added per cardiology.  Add hydralazine.  Cardiology consulted and are following.   DVT prophylaxis: Heparin drip Code Status: Full Family Communication: Updated patient and granddaughter at bedside. Disposition:   Status is: Observation    Dispo: The patient is from: Home              Anticipated d/c is to: Home              Anticipated d/c date is: To be determined.              Patient currently being worked up for chest pain, seen by cardiology, patient for cardiac catheterization tomorrow.       Consultants:   Cardiology: Dr. Doylene Canard 07/08/2020  Procedures:   2D echo pending  Chest x-ray 07/07/2020    Antimicrobials:  None   Subjective: Just returning from bathroom.  States improvement with chest pain since admission and currently at approximately a 2/10.  Denies any significant shortness of breath.  Denies any abdominal pain.  States was recently started on clonidine and had some significant dizziness, lightheadedness and feels it is affecting his kidneys.  Objective: Vitals:   07/08/20 0810 07/08/20 1039 07/08/20 1404 07/08/20 1500  BP: (!) 193/70 (!) 188/73 (!) 121/55 119/60  Pulse: 61  (!) 58 (!) 55  Resp: 19  17   Temp: 98 F (36.7 C)  (!) 97.4 F (36.3 C)   TempSrc: Oral  Oral   SpO2: 100%  96%   Weight:      Height:        Intake/Output Summary (Last 24 hours) at 07/08/2020 1615 Last data filed at 07/08/2020 1100 Gross per 24 hour  Intake 240 ml  Output 410 ml  Net -170 ml   Filed Weights   07/07/20 1750  Weight: 72.6 kg     Examination:  General exam: Appears calm and comfortable  Respiratory system: Clear to auscultation. Respiratory effort normal. Cardiovascular system: S1 & S2 heard, RRR. No JVD, murmurs, rubs, gallops or clicks. No pedal edema. Gastrointestinal system: Abdomen is nondistended, soft and nontender. No organomegaly or masses felt. Normal bowel sounds heard. Central nervous system: Alert and oriented. No focal neurological deficits. Extremities: Symmetric 5 x 5 power. Skin: No rashes, lesions or ulcers Psychiatry: Judgement and insight appear normal. Mood & affect appropriate.     Data Reviewed: I have personally reviewed following labs and imaging studies  CBC: Recent Labs  Lab 07/07/20 1801 07/08/20 1045  WBC 5.9 6.9  HGB 13.1 14.9  HCT 40.1 45.7  MCV 88.3 87.7  PLT 113* 130*    Basic Metabolic Panel: Recent Labs  Lab 07/07/20 1801 07/08/20 0824  NA 137 140  K 3.7 3.5  CL 103 104  CO2 23 26  GLUCOSE 107* 116*  BUN 32* 25*  CREATININE 1.01 0.81  CALCIUM 8.7* 9.2  MG  --  2.3    GFR: Estimated Creatinine Clearance: 73.9 mL/min (by C-G formula based on SCr of 0.81 mg/dL).  Liver Function Tests: No results for input(s): AST, ALT, ALKPHOS, BILITOT, PROT, ALBUMIN in the last 168 hours.  CBG: No results for input(s): GLUCAP in the last 168 hours.   Recent Results (from the past 240 hour(s))  SARS Coronavirus 2 by RT PCR (hospital order, performed in [facility] hospital lab) Nasopharyngeal Nasopharyngeal Swab     Status: None   Collection Time: 07/07/20  7:36 PM   Specimen: Nasopharyngeal Swab  Result Value Ref Range Status   SARS Coronavirus 2 NEGATIVE NEGATIVE Final    Comment: (NOTE) SARS-CoV-2 target nucleic acids are NOT DETECTED.  The SARS-CoV-2 RNA is generally detectable in upper and lower respiratory specimens during the acute phase of infection. The lowest concentration of SARS-CoV-2 viral copies this assay can detect is 250 copies / mL. A  negative result does not preclude SARS-CoV-2 infection and should not be used as the sole basis for treatment or other patient management decisions.  A negative result may occur with improper specimen collection / handling, submission of specimen other than nasopharyngeal swab, presence of viral mutation(s) within the areas targeted by this assay, and inadequate number of viral copies (<250 copies / mL). A negative result must be combined with clinical observations, patient history, and epidemiological information.  Fact Sheet for Patients:   StrictlyIdeas.no  Fact Sheet for Healthcare Providers: BankingDealers.co.za  This test is not yet approved or  cleared by the [place] FDA and has been authorized for detection and/or diagnosis of SARS-CoV-2 by FDA under an Emergency Use Authorization (EUA).  This EUA will remain in effect (meaning this test can be used) for the duration of the COVID-19 declaration under Section 564(b)(1) of the Act, 21 U.S.C. section 360bbb-3(b)(1), unless the authorization is terminated or revoked sooner.  Performed at [facility], [address],  [place], [place] [address]          Radiology Studies: DG Chest 2 View  Result Date: 07/07/2020 CLINICAL DATA:  Chest pain EXAM: CHEST - 2 VIEW COMPARISON:  12/19/2013 chest radiograph. FINDINGS: Stable cardiomediastinal silhouette with normal heart size. No pneumothorax. No pleural effusion. Lungs appear clear, with no acute consolidative airspace disease and no pulmonary edema. IMPRESSION: No active cardiopulmonary disease. Electronically Signed   By: Ilona Sorrel M.D.   On: 07/07/2020 19:16   ECHOCARDIOGRAM COMPLETE  Result Date: 07/08/2020    ECHOCARDIOGRAM REPORT   Patient Name:   BECKHAM [facility] Date of Exam: 07/08/2020 Medical Rec #:  [id number]      Height:       69.0 in Accession #:    [id number]     Weight:       160.0 lb Date of Birth:   [birth year]      BSA:          1.879 m Patient Age:    28 years       BP:           193/70 mmHg Patient Gender: M              HR:           58 bpm. Exam Location:  Inpatient Procedure: 2D Echo, Cardiac Doppler and Color Doppler Indications:     R07.9* Chest pain, unspecified  History:         Patient has prior history of Echocardiogram examinations, most                  recent 10/12/2018. CAD; Risk Factors:Hypertension and                  Dyslipidemia.  Sonographer:     Jonelle Sidle Dance Referring Phys:  [place] Diagnosing Phys: Dixie Dials MD IMPRESSIONS  1. Left ventricular ejection fraction, by estimation, is 55 to 60%. The left ventricle has normal function. The left ventricle has no regional wall motion abnormalities. Left ventricular diastolic parameters are consistent with Grade I diastolic dysfunction (impaired relaxation).  2. Right ventricular systolic function is normal. The right ventricular size is normal.  3. Left atrial size was mildly dilated.  4. The mitral valve is degenerative. Mild mitral valve regurgitation.  5. The aortic valve is tricuspid. Aortic valve regurgitation is trivial. Mild aortic valve sclerosis is present, with no evidence of aortic valve stenosis.  6. There is mild (Grade II) atheroma plaque involving the ascending aorta.  7. The inferior vena cava is normal in size with greater than 50% respiratory variability, suggesting right atrial pressure of 3 mmHg. FINDINGS  Left Ventricle: Left ventricular ejection fraction, by estimation, is 55 to 60%. The left ventricle has normal function. The left ventricle has no regional wall motion abnormalities. The left ventricular internal cavity size was normal in size. There is  borderline concentric left ventricular hypertrophy. Left ventricular diastolic parameters are consistent with Grade I diastolic dysfunction (impaired relaxation). Right Ventricle: The right ventricular size is normal. No increase in right ventricular wall  thickness. Right ventricular systolic function is normal. Left Atrium: Left atrial size was mildly dilated. Right Atrium: Right atrial size was normal in size. Pericardium: There is no evidence of pericardial effusion. Mitral Valve: The mitral valve is degenerative in appearance. There is mild thickening of the mitral valve leaflet(s). Mild mitral annular calcification. Mild mitral valve regurgitation. Tricuspid Valve: The tricuspid valve is normal  in structure. Tricuspid valve regurgitation is mild. Aortic Valve: The aortic valve is tricuspid. . There is mild thickening and mild calcification of the aortic valve. Aortic valve regurgitation is trivial. Aortic regurgitation PHT measures 1925 msec. Mild aortic valve sclerosis is present, with no evidence of aortic valve stenosis. Mild aortic valve annular calcification. There is mild thickening of the aortic valve. There is mild calcification of the aortic valve. Pulmonic Valve: The pulmonic valve was normal in structure. Pulmonic valve regurgitation is not visualized. Aorta: The aortic root is normal in size and structure. There is mild (Grade II) atheroma plaque involving the ascending aorta. Venous: The inferior vena cava is normal in size with greater than 50% respiratory variability, suggesting right atrial pressure of 3 mmHg. IAS/Shunts: The interatrial septum was not assessed.  LEFT VENTRICLE PLAX 2D LVIDd:         4.80 cm  Diastology LVIDs:         3.20 cm  LV e' lateral:   8.16 cm/s LV PW:         1.20 cm  LV E/e' lateral: 8.7 LV IVS:        1.10 cm  LV e' medial:    8.38 cm/s LVOT diam:     2.10 cm  LV E/e' medial:  8.5 LV SV:         89 LV SV Index:   48 LVOT Area:     3.46 cm  RIGHT VENTRICLE            IVC RV Basal diam:  2.40 cm    IVC diam: 1.80 cm RV S prime:     9.36 cm/s TAPSE (M-mode): 1.4 cm LEFT ATRIUM             Index       RIGHT ATRIUM           Index LA diam:        4.00 cm 2.13 cm/m  RA Area:     11.60 cm LA Vol (A2C):   76.4 ml 40.66  ml/m RA Volume:   18.50 ml  9.85 ml/m LA Vol (A4C):   36.1 ml 19.21 ml/m LA Biplane Vol: 55.8 ml 29.70 ml/m  AORTIC VALVE LVOT Vmax:   116.00 cm/s LVOT Vmean:  81.500 cm/s LVOT VTI:    0.258 m AI PHT:      1925 msec  AORTA Ao Root diam: 3.70 cm Ao Asc diam:  3.20 cm MITRAL VALVE MV Area (PHT): 2.20 cm    SHUNTS MV Decel Time: 345 msec    Systemic VTI:  0.26 m MV E velocity: 71.00 cm/s  Systemic Diam: 2.10 cm MV A velocity: 90.00 cm/s MV E/A ratio:  0.79 Dixie Dials MD Electronically signed by Dixie Dials MD Signature Date/Time: 07/08/2020/3:08:23 PM    Final         Scheduled Meds:  aspirin EC  81 mg Oral Daily   chlorthalidone  12.5 mg Oral Daily   hydrALAZINE  25 mg Oral Q8H   irbesartan  300 mg Oral Daily   And   hydrochlorothiazide  12.5 mg Oral Daily   isosorbide mononitrate  30 mg Oral Daily   [START ON 07/09/2020] rosuvastatin  10 mg Oral Weekly   Continuous Infusions:  heparin 900 Units/hr (07/08/20 1217)     LOS: 0 days    Time spent: 35 minutes  No charge    Irine Seal, MD [facility]   To contact the attending provider  between 7A-7P or the covering provider during after hours 7P-7A, please log into the web site [email or web address] and access using universal [place] password for that web site. If you do not have the password, please call the hospital operator.  07/08/2020, 4:15 PM

## 2020-07-08 NOTE — Progress Notes (Signed)
  Echocardiogram 2D Echocardiogram has been attempted. Patient eating lunch. Will reattempt at later time.  Randa Lynn Holmes [place] 07/08/2020, 10:51 AM

## 2020-07-08 NOTE — Progress Notes (Signed)
ANTICOAGULATION CONSULT NOTE - Initial Consult  Pharmacy Consult for IV heparin Indication: chest pain/ACS  Allergies  Allergen Reactions  . Norvasc [Amlodipine Besylate] Other (See Comments)    CAUSES TEETH TO FALL OUT    Patient Measurements: Height: 5\' 9"  (175.3 cm) Weight: 72.6 kg (160 lb) IBW/kg (Calculated) : 70.7 Heparin Dosing Weight: actual body weight   Vital Signs: Temp: 97.7 F (36.5 C) (08/22 2158) Temp Source: Oral (08/22 2158) BP: 142/76 (08/22 2158) Pulse Rate: 55 (08/22 2158)  Labs: Recent Labs    07/07/20 1801 07/07/20 1936 07/07/20 2253 07/08/20 0824 07/08/20 1045 07/08/20 2058  HGB 13.1  --   --   --  14.9  --   HCT 40.1  --   --   --  45.7  --   PLT 113*  --   --   --  130*  --   APTT  --   --   --   --  32  --   LABPROT  --   --   --   --  12.6  --   INR  --   --   --   --  1.0  --   HEPARINUNFRC  --   --   --   --   --  0.40  CREATININE 1.01  --   --  0.81  --   --   TROPONINIHS 12 12 16   --   --   --     Estimated Creatinine Clearance: 73.9 mL/min (by C-G formula based on SCr of 0.81 mg/dL).   Medical History: Past Medical History:  Diagnosis Date  . 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 Arthritis    SHOULDERS, NECK , RIGHT HIP  . Bladder cancer ([place]) DX   OCT 2011--  FOLLOWED BY DR WOODRUFF   S/P TURBT,  HIGH GRADE BLADDER CANCER  . CAD (coronary artery disease), native coronary artery 08/30/2018   normal Left main, 30% stenosis ostial LAD, 50% stenosis OM 3, 80% stenosis mid RCA;  2.5 x 8 mm Tetra Stent Dr. Doylene Canard  . Coronary artery disease CARDIOLOGIST- DR Wynonia Lawman-  LAST VISIT 2 WKS AGO-- WILL REQUEST NOTE AND EKG   STRESS TEST -- JULY 2009  . Essential hypertension 08/30/2018  . Hyperlipidemia   . Hypertension   . Nocturia   . Personal history of malignant neoplasm of bladder 08/30/2018  . Post PTCA 2001-   X1 STENT TO RCA   AND 1996    Assessment: 38 y/oM with PMH of CAD s/p stent, HTN, AAA, and bladder cancer s/p  TURBT who presented with left sided chest pain with SOB and radiation to left jaw and arm. Pharmacy consulted for IV heparin dosing for ACS. Patient on ASA 81mg  PO daily PTA, but not on any anticoagulants.  Baseline labs: CBC: H/H WNL, Pltc low at 130K, INR 1.0, aptt 32sec  07/08/2020:  Heparin level 0.4- therapeutic  No bleeding or infusion issues reported by RN  Goal of Therapy:  Heparin level 0.3-0.7 units/ml Monitor platelets by anticoagulation protocol: Yes   Plan:   Continue heparin infusion at 900 units/hr  Daily CBC, heparin level  Monitor closely for s/sx of bleeding  Per Cardiology, plan for cardiac cath in AM- heparin off on call to cath  Netta Cedars, PharmD, BCPS Clinical Pharmacist  07/08/2020,11:06 PM

## 2020-07-08 NOTE — Consult Note (Signed)
Referring Physician: Irine Seal, MD   Ricardo [name] is an 79 y.o. male.                       Chief Complaint: Chest pain  HPI: 79 years old male with PMH of CAD, s/p stent, HTN, Hyperlipidemia, AAA and bladder CA s/p TURBT had left sided chest pain with shortness of breath and radiation to left jaw and arm. EKG showed sinus bradycardia. Troponin I levels are minimally elevated. Chest x-ray is unremarkable.  Past Medical History:  Diagnosis Date  . 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 Arthritis    SHOULDERS, NECK , RIGHT HIP  . Bladder cancer ([place]) DX   OCT 2011--  FOLLOWED BY DR WOODRUFF   S/P TURBT,  HIGH GRADE BLADDER CANCER  . CAD (coronary artery disease), native coronary artery 08/30/2018   normal Left main, 30% stenosis ostial LAD, 50% stenosis OM 3, 80% stenosis mid RCA;  2.5 x 8 mm Tetra Stent Dr. Doylene Canard  . Coronary artery disease CARDIOLOGIST- DR Wynonia Lawman-  LAST VISIT 2 WKS AGO-- WILL REQUEST NOTE AND EKG   STRESS TEST -- JULY 2009  . Essential hypertension 08/30/2018  . Hyperlipidemia   . Hypertension   . Nocturia   . Personal history of malignant neoplasm of bladder 08/30/2018  . Post PTCA 2001-   X1 STENT TO RCA   AND 1996      Past Surgical History:  Procedure Laterality Date  . CORONARY ANGIOPLASTY  1996  . CORONARY ANGIOPLASTY WITH STENT PLACEMENT  2001   STENT TO RCA  . CYSTO/ BLADDER BX  10-22-10  . CYSTOSCOPY W/ RETROGRADES  11/12/2011   Procedure: CYSTOSCOPY WITH RETROGRADE PYELOGRAM;  Surgeon: Molli Hazard, MD;  Location: [facility];  Service: Urology;  Laterality: Bilateral;  . LUMBAR FUSION    . TRANSURETHRAL RESECTION OF BLADDER TUMOR  08-27-10   RIGHT ANTERIOR DOME  . TRANSURETHRAL RESECTION OF BLADDER TUMOR  11/12/2011   Procedure: TRANSURETHRAL RESECTION OF BLADDER TUMOR (TURBT);  Surgeon: Molli Hazard, MD;  Location[facility];  Service: Urology;  Laterality: N/A;  with PK  Gyrus c-arm gyrus  digital ureteroscope    History reviewed. No pertinent family history. Social History:  reports that he quit smoking about 54 years ago. His smoking use included cigarettes. He smoked 1.50 packs per day. He has never used smokeless tobacco. He reports that he does not drink alcohol and does not use drugs.  Allergies:  Allergies  Allergen Reactions  . Norvasc [Amlodipine Besylate] Other (See Comments)    CAUSES TEETH TO FALL OUT    Medications Prior to Admission  Medication Sig Dispense Refill  . aspirin 81 MG tablet Take 1 tablet (81 mg total) by mouth daily. 1 tablet 0  . chlorthalidone (HYGROTON) 25 MG tablet Take 0.5 tablets (12.5 mg total) by mouth daily. 90 tablet 1  . cloNIDine (CATAPRES) 0.2 MG tablet Take 0.2 mg by mouth 2 (two) times daily.    . Coenzyme Q10 (CO Q-10) 200 MG CAPS Take 200 mg by mouth daily. 90 each 0  . metoprolol succinate (TOPROL-XL) 25 MG 24 hr tablet Take 25 mg by mouth every morning.    . nitroGLYCERIN (NITROSTAT) 0.4 MG SL tablet Place 1 tablet (0.4 mg total) under the tongue every 5 (five) minutes as needed. 25 tablet 11  . REPATHA SURECLICK 626 MG/ML SOAJ Inject 1 mL into the skin every 30 (thirty)  days.    . rosuvastatin (CRESTOR) 10 MG tablet Take 10 mg by mouth once a week.    . telmisartan-hydrochlorothiazide (MICARDIS HCT) 80-12.5 MG tablet Take 1 tablet by mouth every morning.      Results for orders placed or performed during the hospital encounter of 07/07/20 (from the past 48 hour(s))  Basic metabolic panel     Status: Abnormal   Collection Time: 07/07/20  6:01 PM  Result Value Ref Range   Sodium 137 135 - 145 mmol/L   Potassium 3.7 3.5 - 5.1 mmol/L   Chloride 103 98 - 111 mmol/L   CO2 23 22 - 32 mmol/L   Glucose, Bld 107 (H) 70 - 99 mg/dL    Comment: Glucose reference range applies only to samples taken after fasting for at least 8 hours.   BUN 32 (H) 8 - 23 mg/dL   Creatinine, Ser 1.01 0.61 - 1.24 mg/dL   Calcium  8.7 (L) 8.9 - 10.3 mg/dL   GFR calc non Af Amer >60 >60 mL/min   GFR calc Af Amer >60 >60 mL/min   Anion gap 11 5 - 15    Comment: Performed at [facility], [address] [place], [place] [address]  CBC     Status: Abnormal   Collection Time: 07/07/20  6:01 PM  Result Value Ref Range   WBC 5.9 4.0 - 10.5 K/uL   RBC 4.54 4.22 - 5.81 MIL/uL   Hemoglobin 13.1 13.0 - 17.0 g/dL   HCT 40.1 39 - 52 %   MCV 88.3 80.0 - 100.0 fL   MCH 28.9 26.0 - 34.0 pg   MCHC 32.7 30.0 - 36.0 g/dL   RDW 12.3 11.5 - 15.5 %   Platelets 113 (L) 150 - 400 K/uL    Comment: REPEATED TO VERIFY PLATELET COUNT CONFIRMED BY SMEAR Immature Platelet Fraction may be clinically indicated, consider ordering this additional test [id number]    nRBC 0.0 0.0 - 0.2 %    Comment: Performed at [facility], [address] [place], [place] [address]  Troponin I (High Sensitivity)     Status: None   Collection Time: 07/07/20  6:01 PM  Result Value Ref Range   Troponin I (High Sensitivity) 12 <18 ng/L    Comment: (NOTE) Elevated high sensitivity troponin I (hsTnI) values and significant  changes across serial measurements may suggest ACS but many other  chronic and acute conditions are known to elevate hsTnI results.  Refer to the "Links" section for chest pain algorithms and additional  guidance. Performed at [facility], [address] [place], [place] [address]   SARS Coronavirus 2 by RT PCR (hospital order, performed in [facility] hospital lab) Nasopharyngeal Nasopharyngeal Swab     Status: None   Collection Time: 07/07/20  7:36 PM   Specimen: Nasopharyngeal Swab  Result Value Ref Range   SARS Coronavirus 2 NEGATIVE NEGATIVE    Comment: (NOTE) SARS-CoV-2 target nucleic acids are NOT DETECTED.  The SARS-CoV-2 RNA is generally detectable in upper and lower respiratory specimens during the acute phase of infection. The lowest concentration of SARS-CoV-2 viral copies this assay  can detect is 250 copies / mL. A negative result does not preclude SARS-CoV-2 infection and should not be used as the sole basis for treatment or other patient management decisions.  A negative result may occur with improper specimen collection / handling, submission of specimen other than nasopharyngeal swab, presence of  viral mutation(s) within the areas targeted by this assay, and inadequate number of viral copies (<250 copies / mL). A negative result must be combined with clinical observations, patient history, and epidemiological information.  Fact Sheet for Patients:   StrictlyIdeas.no  Fact Sheet for Healthcare Providers: BankingDealers.co.za  This test is not yet approved or  cleared by the [place] FDA and has been authorized for detection and/or diagnosis of SARS-CoV-2 by FDA under an Emergency Use Authorization (EUA).  This EUA will remain in effect (meaning this test can be used) for the duration of the COVID-19 declaration under Section 564(b)(1) of the Act, 21 U.S.C. section 360bbb-3(b)(1), unless the authorization is terminated or revoked sooner.  Performed at [facility], [address] [place], [place] [address]   Troponin I (High Sensitivity)     Status: None   Collection Time: 07/07/20  7:36 PM  Result Value Ref Range   Troponin I (High Sensitivity) 12 <18 ng/L    Comment: (NOTE) Elevated high sensitivity troponin I (hsTnI) values and significant  changes across serial measurements may suggest ACS but many other  chronic and acute conditions are known to elevate hsTnI results.  Refer to the "Links" section for chest pain algorithms and additional  guidance. Performed at [facility], [address] [place], [place] [address]   Troponin I (High Sensitivity)     Status: None   Collection Time: 07/07/20 10:53 PM  Result Value Ref Range   Troponin I (High Sensitivity) 16 <18 ng/L     Comment: (NOTE) Elevated high sensitivity troponin I (hsTnI) values and significant  changes across serial measurements may suggest ACS but many other  chronic and acute conditions are known to elevate hsTnI results.  Refer to the "Links" section for chest pain algorithms and additional  guidance. Performed at [facility], [address] [place], [place] [address]   Basic metabolic panel     Status: Abnormal   Collection Time: 07/08/20  8:24 AM  Result Value Ref Range   Sodium 140 135 - 145 mmol/L   Potassium 3.5 3.5 - 5.1 mmol/L   Chloride 104 98 - 111 mmol/L   CO2 26 22 - 32 mmol/L   Glucose, Bld 116 (H) 70 - 99 mg/dL    Comment: Glucose reference range applies only to samples taken after fasting for at least 8 hours.   BUN 25 (H) 8 - 23 mg/dL   Creatinine, Ser 0.81 0.61 - 1.24 mg/dL   Calcium 9.2 8.9 - 10.3 mg/dL   GFR calc non Af Amer >60 >60 mL/min   GFR calc Af Amer >60 >60 mL/min   Anion gap 10 5 - 15    Comment: Performed at [facility], [place] [address], [place], [place] [address]  Magnesium     Status: None   Collection Time: 07/08/20  8:24 AM  Result Value Ref Range   Magnesium 2.3 1.7 - 2.4 mg/dL    Comment: Performed at [facility], [place] [address], [place], [place] [address]   DG Chest 2 View  Result Date: 07/07/2020 CLINICAL DATA:  Chest pain EXAM: CHEST - 2 VIEW COMPARISON:  12/19/2013 chest radiograph. FINDINGS: Stable cardiomediastinal silhouette with normal heart size. No pneumothorax. No pleural effusion. Lungs appear clear, with no acute consolidative airspace disease and no pulmonary edema. IMPRESSION: No active cardiopulmonary disease. Electronically Signed   By: Ilona Sorrel M.D.   On: 07/07/2020  19:16    Review Of Systems Constitutional: No fever, chills, weight loss or gain. Eyes: No vision change, wears glasses. No discharge or pain. Ears: No hearing loss, No tinnitus. Respiratory: No asthma, COPD,  pneumonias. No shortness of breath. No hemoptysis. Cardiovascular: Positive chest pain, no palpitation, leg edema. Gastrointestinal: No nausea, vomiting, diarrhea, constipation. No GI bleed. No hepatitis. Genitourinary: No dysuria, hematuria, kidney stone. No incontinance. H/O bladder cancer Neurological: No headache, stroke, seizures.  Psychiatry: No psych facility admission for anxiety, depression, suicide. No detox. Skin: No rash. Musculoskeletal: No joint pain, fibromyalgia. No neck pain, back pain. Lymphadenopathy: No lymphadenopathy. Hematology: No anemia or easy bruising.   Blood pressure (!) 193/70, pulse 61, temperature 98 F (36.7 C), temperature source Oral, resp. rate 19, height 5\' 9"  (1.753 m), weight 72.6 kg, SpO2 100 %. Body mass index is 23.63 kg/m. General appearance: alert, cooperative, appears stated age and no distress Head: Normocephalic, atraumatic. Eyes: Brown eyes, pink conjunctiva, corneas clear. PERRL, EOM's intact. Neck: No adenopathy, no carotid bruit, no JVD, supple, symmetrical, trachea midline and thyroid not enlarged. Resp: Clear to auscultation bilaterally. Cardio: Regular rate and rhythm, S1, S2 normal, II/VI systolic murmur, no click, rub or gallop GI: Soft, non-tender; bowel sounds normal; no organomegaly. Extremities: No edema, cyanosis or clubbing. Skin: Warm and dry.  Neurologic: Alert and oriented X 3, normal strength. Normal coordination and gait.  Assessment/Plan Acute coronary syndrome Abnormal HS-troponin I from demand ischemia CAD S/P RCA stent Hypertension Hyperlipidemia  Cardiac catheterization in AM. Patient understood procedure, alternatives and risks.  Time spent: Review of old records, Lab, x-rays, EKG, other cardiac tests, examination, discussion with patient over 70 minutes.  Birdie Riddle, MD  07/08/2020, 9:24 AM

## 2020-07-09 ENCOUNTER — Encounter (HOSPITAL_COMMUNITY)
Admission: EM | Disposition: A | Discharge status: Home / Self Care | Payer: Self-pay | Source: Home / Self Care | Attending: Internal Medicine

## 2020-07-09 DIAGNOSIS — I714 Abdominal aortic aneurysm, without rupture, unspecified: Secondary | ICD-10-CM | POA: Diagnosis present

## 2020-07-09 DIAGNOSIS — I10 Essential (primary) hypertension: Secondary | ICD-10-CM | POA: Diagnosis not present

## 2020-07-09 DIAGNOSIS — R001 Bradycardia, unspecified: Secondary | ICD-10-CM | POA: Diagnosis not present

## 2020-07-09 DIAGNOSIS — I2510 Atherosclerotic heart disease of native coronary artery without angina pectoris: Secondary | ICD-10-CM | POA: Diagnosis not present

## 2020-07-09 DIAGNOSIS — R079 Chest pain, unspecified: Secondary | ICD-10-CM | POA: Diagnosis not present

## 2020-07-09 HISTORY — PX: LEFT HEART CATH AND CORONARY ANGIOGRAPHY: CATH118249

## 2020-07-09 LAB — URINE CULTURE: Culture: 70000 — AB

## 2020-07-09 LAB — CBC
HCT: 40 % (ref 39.0–52.0)
Hemoglobin: 13.4 g/dL (ref 13.0–17.0)
MCH: 29.3 pg (ref 26.0–34.0)
MCHC: 33.5 g/dL (ref 30.0–36.0)
MCV: 87.3 fL (ref 80.0–100.0)
Platelets: 118 10*3/uL — ABNORMAL LOW (ref 150–400)
RBC: 4.58 MIL/uL (ref 4.22–5.81)
RDW: 12.5 % (ref 11.5–15.5)
WBC: 8.3 10*3/uL (ref 4.0–10.5)
nRBC: 0 % (ref 0.0–0.2)

## 2020-07-09 LAB — BASIC METABOLIC PANEL
Anion gap: 9 (ref 5–15)
BUN: 31 mg/dL — ABNORMAL HIGH (ref 8–23)
CO2: 22 mmol/L (ref 22–32)
Calcium: 8.9 mg/dL (ref 8.9–10.3)
Chloride: 109 mmol/L (ref 98–111)
Creatinine, Ser: 1 mg/dL (ref 0.61–1.24)
GFR calc Af Amer: >60 mL/min (ref >60)
GFR calc non Af Amer: >60 mL/min (ref >60)
Glucose, Bld: 117 mg/dL — ABNORMAL HIGH (ref 70–99)
Potassium: 3.9 mmol/L (ref 3.5–5.1)
Sodium: 140 mmol/L (ref 135–145)

## 2020-07-09 LAB — MAGNESIUM: Magnesium: 2.3 mg/dL (ref 1.7–2.4)

## 2020-07-09 LAB — LIPID PANEL
Cholesterol: 177 mg/dL (ref 0–200)
HDL: 42 mg/dL (ref >40)
LDL Cholesterol: 120 mg/dL — ABNORMAL HIGH (ref 0–99)
Total CHOL/HDL Ratio: 4.2 RATIO
Triglycerides: 76 mg/dL (ref <150)
VLDL: 15 mg/dL (ref 0–40)

## 2020-07-09 LAB — HEPARIN LEVEL (UNFRACTIONATED): Heparin Unfractionated: 0.5 IU/mL (ref 0.30–0.70)

## 2020-07-09 SURGERY — LEFT HEART CATH AND CORONARY ANGIOGRAPHY
Anesthesia: LOCAL

## 2020-07-09 MED ORDER — NITROGLYCERIN 1 MG/10 ML FOR IR/CATH LAB
INTRA_ARTERIAL | Status: AC
  Filled 2020-07-09: qty 10

## 2020-07-09 MED ORDER — SODIUM CHLORIDE 0.9 % IV SOLN: INTRAVENOUS | Status: AC

## 2020-07-09 MED ORDER — LIDOCAINE HCL (PF) 1 % IJ SOLN
INTRAMUSCULAR | Status: AC
  Filled 2020-07-09: qty 30

## 2020-07-09 MED ORDER — ROSUVASTATIN CALCIUM 10 MG PO TABS: 10.0000 mg | ORAL_TABLET | Freq: Every day | ORAL | Status: DC

## 2020-07-09 MED ORDER — ISOSORBIDE MONONITRATE ER 60 MG PO TB24
60.0000 mg | ORAL_TABLET | Freq: Every day | ORAL | Status: DC
  Administered 2020-07-10: 60 mg via ORAL
  Filled 2020-07-09: qty 1

## 2020-07-09 MED ORDER — SODIUM CHLORIDE 0.9 % IV SOLN: Freq: Once | INTRAVENOUS | Status: AC

## 2020-07-09 MED ORDER — HEPARIN (PORCINE) IN NACL 1000-0.9 UT/500ML-% IV SOLN
INTRAVENOUS | Status: AC
  Filled 2020-07-09: qty 1000

## 2020-07-09 MED ORDER — VERAPAMIL HCL 2.5 MG/ML IV SOLN
INTRAVENOUS | Status: DC | PRN
  Administered 2020-07-09: 10 mL via INTRA_ARTERIAL

## 2020-07-09 MED ORDER — FENTANYL CITRATE (PF) 100 MCG/2ML IJ SOLN
INTRAMUSCULAR | Status: DC | PRN
Start: 2020-07-09 — End: 2020-07-09
  Administered 2020-07-09 (×2): 25 ug via INTRAVENOUS

## 2020-07-09 MED ORDER — ROSUVASTATIN CALCIUM 5 MG PO TABS
5.0000 mg | ORAL_TABLET | Freq: Every day | ORAL | Status: DC
  Administered 2020-07-09: 5 mg via ORAL
  Filled 2020-07-09: qty 1

## 2020-07-09 MED ORDER — HEPARIN SODIUM (PORCINE) 1000 UNIT/ML IJ SOLN
INTRAMUSCULAR | Status: AC
  Filled 2020-07-09: qty 1

## 2020-07-09 MED ORDER — VERAPAMIL HCL 2.5 MG/ML IV SOLN
INTRAVENOUS | Status: AC
  Filled 2020-07-09: qty 2

## 2020-07-09 MED ORDER — IOHEXOL 350 MG/ML SOLN
INTRAVENOUS | Status: DC | PRN
  Administered 2020-07-09: 100 mL via INTRA_ARTERIAL

## 2020-07-09 MED ORDER — MIDAZOLAM HCL 2 MG/2ML IJ SOLN
INTRAMUSCULAR | Status: DC | PRN
  Administered 2020-07-09 (×2): 1 mg via INTRAVENOUS

## 2020-07-09 MED ORDER — LIDOCAINE HCL (PF) 1 % IJ SOLN
INTRAMUSCULAR | Status: DC | PRN
  Administered 2020-07-09: 2 mL

## 2020-07-09 MED ORDER — SODIUM CHLORIDE 0.9 % IV SOLN: 250.0000 mL | INTRAVENOUS | Status: DC | PRN

## 2020-07-09 MED ORDER — HEPARIN SODIUM (PORCINE) 1000 UNIT/ML IJ SOLN
INTRAMUSCULAR | Status: DC | PRN
  Administered 2020-07-09: 3500 [IU] via INTRAVENOUS

## 2020-07-09 MED ORDER — FENTANYL CITRATE (PF) 100 MCG/2ML IJ SOLN
INTRAMUSCULAR | Status: AC
  Filled 2020-07-09: qty 2

## 2020-07-09 MED ORDER — SODIUM CHLORIDE 0.9% FLUSH: 3.0000 mL | INTRAVENOUS | Status: DC | PRN

## 2020-07-09 MED ORDER — LABETALOL HCL 5 MG/ML IV SOLN
INTRAVENOUS | Status: AC
  Filled 2020-07-09: qty 4

## 2020-07-09 MED ORDER — MIDAZOLAM HCL 2 MG/2ML IJ SOLN
INTRAMUSCULAR | Status: AC
  Filled 2020-07-09: qty 2

## 2020-07-09 MED ORDER — SODIUM CHLORIDE 0.9% FLUSH
3.0000 mL | Freq: Two times a day (BID) | INTRAVENOUS | Status: DC
  Administered 2020-07-09 – 2020-07-10 (×2): 3 mL via INTRAVENOUS

## 2020-07-09 MED ORDER — HYDRALAZINE HCL 20 MG/ML IJ SOLN: 10.0000 mg | Freq: Four times a day (QID) | INTRAMUSCULAR | Status: DC | PRN

## 2020-07-09 MED ORDER — LABETALOL HCL 5 MG/ML IV SOLN
10.0000 mg | INTRAVENOUS | Status: AC | PRN
  Administered 2020-07-09 (×2): 10 mg via INTRAVENOUS

## 2020-07-09 MED ORDER — NITROGLYCERIN 1 MG/10 ML FOR IR/CATH LAB
INTRA_ARTERIAL | Status: DC | PRN
  Administered 2020-07-09: 200 ug

## 2020-07-09 MED ORDER — CLOPIDOGREL BISULFATE 75 MG PO TABS
75.0000 mg | ORAL_TABLET | Freq: Every day | ORAL | Status: DC
  Administered 2020-07-10: 75 mg via ORAL
  Filled 2020-07-09: qty 1

## 2020-07-09 MED ORDER — HYDRALAZINE HCL 20 MG/ML IJ SOLN
INTRAMUSCULAR | Status: AC
  Filled 2020-07-09: qty 1

## 2020-07-09 MED ORDER — HEPARIN (PORCINE) IN NACL 1000-0.9 UT/500ML-% IV SOLN
INTRAVENOUS | Status: DC | PRN
  Administered 2020-07-09 (×2): 500 mL

## 2020-07-09 SURGICAL SUPPLY — 10 items
CATH 5FR JL3.5 JR4 ANG PIG MP (CATHETERS) ×1 IMPLANT
DEVICE RAD COMP TR BAND LRG (VASCULAR PRODUCTS) ×1 IMPLANT
GLIDESHEATH SLEND SS 6F .021 (SHEATH) ×1 IMPLANT
GUIDEWIRE INQWIRE 1.5J.035X260 (WIRE) IMPLANT
INQWIRE 1.5J .035X260CM (WIRE) ×2
KIT HEART LEFT (KITS) ×2 IMPLANT
PACK CARDIAC CATHETERIZATION (CUSTOM PROCEDURE TRAY) ×2 IMPLANT
SYR MEDRAD MARK 7 150ML (SYRINGE) IMPLANT
TRANSDUCER W/STOPCOCK (MISCELLANEOUS) ×2 IMPLANT
WIRE HI TORQ VERSACORE-J 145CM (WIRE) ×1 IMPLANT

## 2020-07-09 NOTE — Progress Notes (Signed)
PROGRESS NOTE    Ricardo [name]  JAS:[id number] DOB: April 24, 1941 DOA: 07/07/2020 PCP: Maury Dus, MD    Chief Complaint  Patient presents with  . Chest Pain    Brief Narrative:  Patient 79 year old gentleman history of coronary artery disease status post stent, hypertension, hyperlipidemia, AAA, bladder cancer status post TURBT presented to the ED with substernal chest pain radiating to the left jaw, left upper extremity with improvement with sublingual nitroglycerin.  Patient admitted for chest pain evaluation.  2D echo ordered.  Cardiology consulted.  Patient tentatively scheduled for cardiac catheterization 07/09/2020.   Assessment & Plan:   Principal Problem:   Chest pain, rule out acute myocardial infarction Active Problems:   CAD (coronary artery disease), native coronary artery   Essential hypertension   Bradycardia  1 chest pain Patient 79 year old gentleman, history of coronary artery disease status post stent, hyperlipidemia, hypertension, presenting with left-sided stump sternal chest pain radiating to the left jaw and left upper extremity concerning for ischemia.  Cardiac enzymes high-sensitivity troponin at 12 and 16.  2D echo with EF of 55 to 60%, no wall motion abnormalities, grade 1 diastolic dysfunction, mildly dilated left atrial size, mild mitral valve regurgitation, trivial aortic valvular regurgitation, mild grade 2 atheroma plaque involving descending aorta.  Fasting lipid panel with LDL of 120. Continue home regimen high-grade trauma, Avapro, Microzide.  Change Crestor dose to 5 mg daily from 10 mg weekly.  Continue hydralazine and Imdur.  Patient seen in consultation by cardiology and patient for cardiac catheterization today.  Per cardiology.  2.  Hyperlipidemia Fasting lipid panel with LDL of 120.  Patient on Crestor 10 mg weekly.  Change Crestor to 5 mg daily.  Follow.    3.  Hypertension Patient stated recently started on clonidine however unable to  tolerate side effects of dizziness, lightheadedness, gait instability, now feels it is affecting his kidneys.  Clonidine discontinued during this hospitalization likely will not resume on discharge. Continue Avapro, Microzide, chlorthalidone, Avapro, Imdur.  Cardiology following.  Likely will not resume clonidine on discharge.   4.  Bradycardia Questionable etiology.  Concern may be secondary to antihypertensive medications.  Patient noted to have recently been started on clonidine.  Patient unable to tolerate clonidine as he stated he had some balance issues, dizziness and lightheadedness.  Clonidine and metoprolol on hold.  Cardiology following.    5.  Coronary artery disease status post stent to RCA See problem #1.  Continue aspirin, statin, ARB, diuretic, hydralazine, Imdur.  Fasting lipid panel with LDL of 120.  Patient was on Crestor 10 mg weekly which will change to Crestor 5 mg daily.  Cardiology consulted and following.  Patient for cardiac catheterization today.  6.  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  Risk factor modification.  Strict blood pressure management.  Change statin from Crestor 10 mg weekly to Crestor 5 mg daily.  Outpatient follow-up.   DVT prophylaxis: Heparin drip Code Status: Full Family Communication: Updated patient and wife at bedside.  Disposition:   Status is: Observation    Dispo: The patient is from: Home              Anticipated d/c is to: Home              Anticipated d/c date is: To be determined.              Patient currently being worked up for chest pain, seen by cardiology,  patient for cardiac catheterization today.       Consultants:   Cardiology: Dr. Doylene Canard 07/08/2020  Procedures:   2D echo  Chest x-ray 07/07/2020    Antimicrobials:  None   Subjective: Laying in bed.  Denies any further chest pain.  No abdominal pain.  No flank pain.  No dizziness or lightheadedness.   Feeling better than he did on admission.  Wife at bedside.   Objective: Vitals:   07/08/20 1806 07/08/20 1835 07/08/20 2158 07/09/20 0624  BP: 130/61 (!) 141/62 (!) 142/76 (!) 180/84  Pulse:  61 (!) 55 61  Resp:  17 20 18   Temp:  97.8 F (36.6 C) 97.7 F (36.5 C) 98.1 F (36.7 C)  TempSrc:  Oral Oral Oral  SpO2:  97% 98% 97%  Weight:      Height:        Intake/Output Summary (Last 24 hours) at 07/09/2020 0847 Last data filed at 07/09/2020 0300 Gross per 24 hour  Intake 411.96 ml  Output --  Net 411.96 ml   Filed Weights   07/07/20 1750  Weight: 72.6 kg    Examination:  General exam: NAD Respiratory system: CTAB.  No wheezing, no crackles, no rhonchi.  Normal respiratory effort.  Cardiovascular system: Regular rate rhythm no murmurs rubs or gallops.  No JVD.  No lower extremity edema.  Gastrointestinal system: Abdomen is soft, nontender, nondistended, positive bowel sounds.  No rebound.  No guarding.  Central nervous system: Alert and oriented. No focal neurological deficits. Extremities: Symmetric 5 x 5 power. Skin: No rashes, lesions or ulcers Psychiatry: Judgement and insight appear normal. Mood & affect appropriate.     Data Reviewed: I have personally reviewed following labs and imaging studies  CBC: Recent Labs  Lab 07/07/20 1801 07/08/20 1045 07/09/20 0757  WBC 5.9 6.9 8.3  HGB 13.1 14.9 13.4  HCT 40.1 45.7 40.0  MCV 88.3 87.7 87.3  PLT 113* 130* 118*    Basic Metabolic Panel: Recent Labs  Lab 07/07/20 1801 07/08/20 0824 07/09/20 0757  NA 137 140 140  K 3.7 3.5 3.9  CL 103 104 109  CO2 23 26 22   GLUCOSE 107* 116* 117*  BUN 32* 25* 31*  CREATININE 1.01 0.81 1.00  CALCIUM 8.7* 9.2 8.9  MG  --  2.3 2.3    GFR: Estimated Creatinine Clearance: 59.9 mL/min (by C-G formula based on SCr of 1 mg/dL).  Liver Function Tests: No results for input(s): AST, ALT, ALKPHOS, BILITOT, PROT, ALBUMIN in the last 168 hours.  CBG: No results for  input(s): GLUCAP in the last 168 hours.   Recent Results (from the past 240 hour(s))  SARS Coronavirus 2 by RT PCR (hospital order, performed in [facility] hospital lab) Nasopharyngeal Nasopharyngeal Swab     Status: None   Collection Time: 07/07/20  7:36 PM   Specimen: Nasopharyngeal Swab  Result Value Ref Range Status   SARS Coronavirus 2 NEGATIVE NEGATIVE Final    Comment: (NOTE) SARS-CoV-2 target nucleic acids are NOT DETECTED.  The SARS-CoV-2 RNA is generally detectable in upper and lower respiratory specimens during the acute phase of infection. The lowest concentration of SARS-CoV-2 viral copies this assay can detect is 250 copies / mL. A negative result does not preclude SARS-CoV-2 infection and should not be used as the sole basis for treatment or other patient management decisions.  A negative result may occur with improper specimen collection / handling, submission of specimen other than nasopharyngeal swab, presence of viral  mutation(s) within the areas targeted by this assay, and inadequate number of viral copies (<250 copies / mL). A negative result must be combined with clinical observations, patient history, and epidemiological information.  Fact Sheet for Patients:   StrictlyIdeas.no  Fact Sheet for Healthcare Providers: BankingDealers.co.za  This test is not yet approved or  cleared by the [place] FDA and has been authorized for detection and/or diagnosis of SARS-CoV-2 by FDA under an Emergency Use Authorization (EUA).  This EUA will remain in effect (meaning this test can be used) for the duration of the COVID-19 declaration under Section 564(b)(1) of the Act, 21 U.S.C. section 360bbb-3(b)(1), unless the authorization is terminated or revoked sooner.  Performed at [facility], [address] [place], [place] [address]          Radiology Studies: DG Chest 2 View  Result Date:  07/07/2020 CLINICAL DATA:  Chest pain EXAM: CHEST - 2 VIEW COMPARISON:  12/19/2013 chest radiograph. FINDINGS: Stable cardiomediastinal silhouette with normal heart size. No pneumothorax. No pleural effusion. Lungs appear clear, with no acute consolidative airspace disease and no pulmonary edema. IMPRESSION: No active cardiopulmonary disease. Electronically Signed   By: Ilona Sorrel M.D.   On: 07/07/2020 19:16   ECHOCARDIOGRAM COMPLETE  Result Date: 07/08/2020    ECHOCARDIOGRAM REPORT   Patient Name:   JUDDSON COBERN [facility] Date of Exam: 07/08/2020 Medical Rec #:  [id number]      Height:       69.0 in Accession #:    [id number]     Weight:       160.0 lb Date of Birth:  10/23/1941      BSA:          1.879 m Patient Age:    65 years       BP:           193/70 mmHg Patient Gender: M              HR:           58 bpm. Exam Location:  Inpatient Procedure: 2D Echo, Cardiac Doppler and Color Doppler Indications:     R07.9* Chest pain, unspecified  History:         Patient has prior history of Echocardiogram examinations, most                  recent 10/12/2018. CAD; Risk Factors:Hypertension and                  Dyslipidemia.  Sonographer:     Jonelle Sidle Dance Referring Phys:  [place] Diagnosing Phys: Dixie Dials MD IMPRESSIONS  1. Left ventricular ejection fraction, by estimation, is 55 to 60%. The left ventricle has normal function. The left ventricle has no regional wall motion abnormalities. Left ventricular diastolic parameters are consistent with Grade I diastolic dysfunction (impaired relaxation).  2. Right ventricular systolic function is normal. The right ventricular size is normal.  3. Left atrial size was mildly dilated.  4. The mitral valve is degenerative. Mild mitral valve regurgitation.  5. The aortic valve is tricuspid. Aortic valve regurgitation is trivial. Mild aortic valve sclerosis is present, with no evidence of aortic valve stenosis.  6. There is mild (Grade II) atheroma plaque involving the  ascending aorta.  7. The inferior vena cava is normal in size with greater than 50% respiratory variability, suggesting right atrial pressure of 3 mmHg. FINDINGS  Left Ventricle: Left ventricular ejection fraction, by estimation,  is 55 to 60%. The left ventricle has normal function. The left ventricle has no regional wall motion abnormalities. The left ventricular internal cavity size was normal in size. There is  borderline concentric left ventricular hypertrophy. Left ventricular diastolic parameters are consistent with Grade I diastolic dysfunction (impaired relaxation). Right Ventricle: The right ventricular size is normal. No increase in right ventricular wall thickness. Right ventricular systolic function is normal. Left Atrium: Left atrial size was mildly dilated. Right Atrium: Right atrial size was normal in size. Pericardium: There is no evidence of pericardial effusion. Mitral Valve: The mitral valve is degenerative in appearance. There is mild thickening of the mitral valve leaflet(s). Mild mitral annular calcification. Mild mitral valve regurgitation. Tricuspid Valve: The tricuspid valve is normal in structure. Tricuspid valve regurgitation is mild. Aortic Valve: The aortic valve is tricuspid. . There is mild thickening and mild calcification of the aortic valve. Aortic valve regurgitation is trivial. Aortic regurgitation PHT measures 1925 msec. Mild aortic valve sclerosis is present, with no evidence of aortic valve stenosis. Mild aortic valve annular calcification. There is mild thickening of the aortic valve. There is mild calcification of the aortic valve. Pulmonic Valve: The pulmonic valve was normal in structure. Pulmonic valve regurgitation is not visualized. Aorta: The aortic root is normal in size and structure. There is mild (Grade II) atheroma plaque involving the ascending aorta. Venous: The inferior vena cava is normal in size with greater than 50% respiratory variability, suggesting right  atrial pressure of 3 mmHg. IAS/Shunts: The interatrial septum was not assessed.  LEFT VENTRICLE PLAX 2D LVIDd:         4.80 cm  Diastology LVIDs:         3.20 cm  LV e' lateral:   8.16 cm/s LV PW:         1.20 cm  LV E/e' lateral: 8.7 LV IVS:        1.10 cm  LV e' medial:    8.38 cm/s LVOT diam:     2.10 cm  LV E/e' medial:  8.5 LV SV:         89 LV SV Index:   48 LVOT Area:     3.46 cm  RIGHT VENTRICLE            IVC RV Basal diam:  2.40 cm    IVC diam: 1.80 cm RV S prime:     9.36 cm/s TAPSE (M-mode): 1.4 cm LEFT ATRIUM             Index       RIGHT ATRIUM           Index LA diam:        4.00 cm 2.13 cm/m  RA Area:     11.60 cm LA Vol (A2C):   76.4 ml 40.66 ml/m RA Volume:   18.50 ml  9.85 ml/m LA Vol (A4C):   36.1 ml 19.21 ml/m LA Biplane Vol: 55.8 ml 29.70 ml/m  AORTIC VALVE LVOT Vmax:   116.00 cm/s LVOT Vmean:  81.500 cm/s LVOT VTI:    0.258 m AI PHT:      1925 msec  AORTA Ao Root diam: 3.70 cm Ao Asc diam:  3.20 cm MITRAL VALVE MV Area (PHT): 2.20 cm    SHUNTS MV Decel Time: 345 msec    Systemic VTI:  0.26 m MV E velocity: 71.00 cm/s  Systemic Diam: 2.10 cm MV A velocity: 90.00 cm/s MV E/A ratio:  0.79 Dixie Dials MD  Electronically signed by Dixie Dials MD Signature Date/Time: 07/08/2020/3:08:23 PM    Final         Scheduled Meds: . aspirin EC  81 mg Oral Daily  . chlorthalidone  12.5 mg Oral Daily  . hydrALAZINE  25 mg Oral Q8H  . irbesartan  300 mg Oral Daily   And  . hydrochlorothiazide  12.5 mg Oral Daily  . isosorbide mononitrate  30 mg Oral Daily  . rosuvastatin  5 mg Oral Daily   Continuous Infusions: . heparin 900 Units/hr (07/08/20 1217)     LOS: 0 days    Time spent: 35 minutes     Irine Seal, MD [facility]   To contact the attending provider between 7A-7P or the covering provider during after hours 7P-7A, please log into the web site [email or web address] and access using universal [place] password for that web site. If you do not have the password,  please call the hospital operator.  07/09/2020, 8:47 AM

## 2020-07-09 NOTE — Progress Notes (Signed)
Patient returned to unit via [facility] transport, no c/o pain voiced, telemetry applied, VS obtained, dressing intact to R wrist w/ no S/S bleeding noted, call light placed in reach

## 2020-07-09 NOTE — Interval H&P Note (Signed)
History and Physical Interval Note:  07/09/2020 12:39 PM  Ricardo [name]  has presented today for surgery, with the diagnosis of unstable angina.  The various methods of treatment have been discussed with the patient and family. After consideration of risks, benefits and other options for treatment, the patient has consented to  Procedure(s): LEFT HEART CATH AND CORONARY ANGIOGRAPHY (N/A) as a surgical intervention.  The patient's history has been reviewed, patient examined, no change in status, stable for surgery.  I have reviewed the patient's chart and labs.  Questions were answered to the patient's satisfaction.     Birdie Riddle

## 2020-07-09 NOTE — Progress Notes (Signed)
ANTICOAGULATION CONSULT NOTE  Pharmacy Consult for IV heparin Indication: chest pain/ACS  Allergies  Allergen Reactions   Norvasc [Amlodipine Besylate] Other (See Comments)    CAUSES TEETH TO FALL OUT   Patient Measurements: Height: 5\' 9"  (175.3 cm) Weight: 72.6 kg (160 lb) IBW/kg (Calculated) : 70.7 Heparin Dosing Weight: actual body weight   Vital Signs: Temp: 98.1 F (36.7 C) (08/23 0624) Temp Source: Oral (08/23 0624) BP: 146/72 (08/23 0904) Pulse Rate: 61 (08/23 0624)  Labs: Recent Labs    07/07/20 1801 07/07/20 1801 07/07/20 1936 07/07/20 2253 07/08/20 0824 07/08/20 1045 07/08/20 2058 07/09/20 0757  HGB 13.1   < >  --   --   --  14.9  --  13.4  HCT 40.1  --   --   --   --  45.7  --  40.0  PLT 113*  --   --   --   --  130*  --  118*  APTT  --   --   --   --   --  32  --   --   LABPROT  --   --   --   --   --  12.6  --   --   INR  --   --   --   --   --  1.0  --   --   HEPARINUNFRC  --   --   --   --   --   --  0.40 0.50  CREATININE 1.01  --   --   --  0.81  --   --  1.00  TROPONINIHS 12  --  12 16  --   --   --   --    < > = values in this interval not displayed.   Estimated Creatinine Clearance: 59.9 mL/min (by C-G formula based on SCr of 1 mg/dL).  Medical History: Past Medical History:  Diagnosis Date   Abdominal aneurysm ([place]) 08/30/2018   Arthritis    SHOULDERS, NECK , RIGHT HIP   Bladder cancer ([place]) DX   OCT 2011--  FOLLOWED BY DR WOODRUFF   S/P TURBT,  HIGH GRADE BLADDER CANCER   CAD (coronary artery disease), native coronary artery 08/30/2018   normal Left main, 30% stenosis ostial LAD, 50% stenosis OM 3, 80% stenosis mid RCA;  2.5 x 8 mm Tetra Stent Dr. Doylene Canard   Coronary artery disease CARDIOLOGIST- DR Wynonia Lawman-  LAST VISIT 2 WKS AGO-- WILL REQUEST NOTE AND EKG   STRESS TEST -- JULY 2009   Essential hypertension 08/30/2018   Hyperlipidemia    Hypertension    Nocturia    Personal history of malignant neoplasm of bladder 08/30/2018    Post PTCA 2001-   X1 STENT TO RCA   AND 1996   Assessment: 67 y/oM with PMH of CAD s/p stent, HTN, AAA, and bladder cancer s/p TURBT who presented with left sided chest pain with SOB and radiation to left jaw and arm. Pharmacy consulted for IV heparin dosing for ACS. Patient on ASA 81mg  PO daily PTA, but not on any anticoagulants.  Baseline labs: CBC: H/H WNL, Pltc low at 130K, INR 1.0, aptt 32sec  Today, 07/09/2020  Heparin level 0.54- therapeutic  No bleeding or infusion issues reported by RN  Cardiac Cath today  Goal of Therapy:  Heparin level 0.3-0.7 units/ml Monitor platelets by anticoagulation protocol: Yes   Plan:   Continue heparin infusion at 900 units/hr  Daily CBC,  heparin level  Monitor closely for s/sx of bleeding  Per Cardiology, plan for cardiac cath in AM- heparin off on call to cath  Minda Ditto PharmD 07/09/2020, 10:45 AM

## 2020-07-10 ENCOUNTER — Encounter (HOSPITAL_COMMUNITY): Payer: Self-pay | Admitting: Cardiovascular Disease

## 2020-07-10 DIAGNOSIS — Z7982 Long term (current) use of aspirin: Secondary | ICD-10-CM | POA: Diagnosis not present

## 2020-07-10 DIAGNOSIS — I200 Unstable angina: Secondary | ICD-10-CM | POA: Diagnosis not present

## 2020-07-10 DIAGNOSIS — R001 Bradycardia, unspecified: Secondary | ICD-10-CM | POA: Diagnosis present

## 2020-07-10 DIAGNOSIS — I25110 Atherosclerotic heart disease of native coronary artery with unstable angina pectoris: Secondary | ICD-10-CM | POA: Diagnosis present

## 2020-07-10 DIAGNOSIS — Z955 Presence of coronary angioplasty implant and graft: Secondary | ICD-10-CM | POA: Diagnosis not present

## 2020-07-10 DIAGNOSIS — Z79899 Other long term (current) drug therapy: Secondary | ICD-10-CM | POA: Diagnosis not present

## 2020-07-10 DIAGNOSIS — Z8551 Personal history of malignant neoplasm of bladder: Secondary | ICD-10-CM | POA: Diagnosis not present

## 2020-07-10 DIAGNOSIS — R0789 Other chest pain: Secondary | ICD-10-CM | POA: Diagnosis present

## 2020-07-10 DIAGNOSIS — I2510 Atherosclerotic heart disease of native coronary artery without angina pectoris: Secondary | ICD-10-CM | POA: Diagnosis not present

## 2020-07-10 DIAGNOSIS — I10 Essential (primary) hypertension: Secondary | ICD-10-CM | POA: Diagnosis present

## 2020-07-10 DIAGNOSIS — Z20822 Contact with and (suspected) exposure to covid-19: Secondary | ICD-10-CM | POA: Diagnosis present

## 2020-07-10 DIAGNOSIS — I714 Abdominal aortic aneurysm, without rupture: Secondary | ICD-10-CM | POA: Diagnosis present

## 2020-07-10 DIAGNOSIS — Z87891 Personal history of nicotine dependence: Secondary | ICD-10-CM | POA: Diagnosis not present

## 2020-07-10 DIAGNOSIS — E785 Hyperlipidemia, unspecified: Secondary | ICD-10-CM | POA: Diagnosis present

## 2020-07-10 DIAGNOSIS — I248 Other forms of acute ischemic heart disease: Secondary | ICD-10-CM | POA: Diagnosis present

## 2020-07-10 LAB — CBC
HCT: 40.6 % (ref 39.0–52.0)
Hemoglobin: 13.3 g/dL (ref 13.0–17.0)
MCH: 28.9 pg (ref 26.0–34.0)
MCHC: 32.8 g/dL (ref 30.0–36.0)
MCV: 88.1 fL (ref 80.0–100.0)
Platelets: 122 10*3/uL — ABNORMAL LOW (ref 150–400)
RBC: 4.61 MIL/uL (ref 4.22–5.81)
RDW: 12.6 % (ref 11.5–15.5)
WBC: 7.9 10*3/uL (ref 4.0–10.5)
nRBC: 0 % (ref 0.0–0.2)

## 2020-07-10 MED ORDER — ISOSORBIDE MONONITRATE ER 60 MG PO TB24
60.0000 mg | ORAL_TABLET | Freq: Every day | ORAL | 1 refills | Status: DC
Start: 2020-07-11 — End: 2021-07-15

## 2020-07-10 MED ORDER — METOPROLOL SUCCINATE ER 50 MG PO TB24
50.0000 mg | ORAL_TABLET | Freq: Every day | ORAL | Status: DC
  Administered 2020-07-10: 50 mg via ORAL
  Filled 2020-07-10: qty 1

## 2020-07-10 MED ORDER — ROSUVASTATIN CALCIUM 5 MG PO TABS
5.0000 mg | ORAL_TABLET | Freq: Every day | ORAL | 1 refills | Status: DC
Start: 2020-07-10 — End: 2020-11-20

## 2020-07-10 MED ORDER — CLOPIDOGREL BISULFATE 75 MG PO TABS
75.0000 mg | ORAL_TABLET | Freq: Every day | ORAL | 1 refills | Status: DC
Start: 2020-07-11 — End: 2021-02-26

## 2020-07-10 MED ORDER — METOPROLOL SUCCINATE ER 50 MG PO TB24
50.0000 mg | ORAL_TABLET | Freq: Every day | ORAL | 1 refills | Status: DC
Start: 2020-07-11 — End: 2020-11-20

## 2020-07-10 MED ORDER — HYDRALAZINE HCL 25 MG PO TABS
25.0000 mg | ORAL_TABLET | Freq: Three times a day (TID) | ORAL | 1 refills | Status: DC
Start: 2020-07-10 — End: 2021-05-12

## 2020-07-10 NOTE — Discharge Summary (Signed)
Physician Discharge Summary  Ricardo [name] SWH:[id number] DOB: Sep 22, 1941 DOA: 07/07/2020  PCP: Maury Dus, MD  Admit date: 07/07/2020 Discharge date: 07/10/2020  Time spent: 55 minutes  Recommendations for Outpatient Follow-up:  1. Follow-up with Dr. Doylene Canard, cardiology in 2 weeks. 2. Follow-up with Maury Dus, MD in 2 to 3 weeks.  On follow-up patient will need a basic metabolic profile done to follow-up on electrolytes and renal function.   Discharge Diagnoses:  Principal Problem:   Unstable angina (HCC) Active Problems:   Chest pain, rule out acute myocardial infarction   CAD (coronary artery disease), native coronary artery   Essential hypertension   Bradycardia   Abdominal aortic aneurysm (AAA) without rupture ([place])   Discharge Condition: Stable and improved  Diet recommendation: Heart healthy  Filed Weights   07/07/20 1750  Weight: 72.6 kg    History of present illness:  HPI per Dr. Assunta Gambles is a 79 y.o. male with medical history significant of CAD s/p stent, HTN, HLD, AAA (3.6cm in Oct 2020), bladder CA s/p TURBT.  Pt presented to the ED at [facility] with c/o CP.  Onset suddenly about 5pm.  Took 1 outdated SL NTG and CP resolved.  Had SOB with CP.  CP left sided radiated to L jaw and L arm.  PT concerned since this felt tlike CP he had in past requiring cardiac stent.  Pt recently started switched from clonidine patch to PO for his HTN.  ED Course: Remained CP free.  HR brady in the low 40s.  EKG reads as junctional but I see P waves (I think S.Loletha Grayer).  BP remains running high.  Trops neg.  Pt transferred to [facility] for CP r/o.  Hospital Course:  1 unstable angina/chest pain Patient 79 year old gentleman, history of coronary artery disease status post stent, hyperlipidemia, hypertension, presented with left-sided sub sternal chest pain radiating to the left jaw and left upper extremity concerning for ischemia.  Cardiac enzymes high-sensitivity  troponin at 12 and 16.  2D echo with EF of 55 to 60%, no wall motion abnormalities, grade 1 diastolic dysfunction, mildly dilated left atrial size, mild mitral valve regurgitation, trivial aortic valvular regurgitation, mild grade 2 atheroma plaque involving descending aorta.  Fasting lipid panel with LDL of 120.  Patient maintained on home regimen of Avapro, chlorthalidone, Microzide.  Hydralazine, Imdur was added to patient's regimen.  Toprol-XL dose increased to 50 mg daily per cardiology for better blood pressure control.  Patient's clonidine was discontinued.  Patient's home regimen of Crestor was changed to 5 mg daily.  Patient seen in consultation by cardiology and underwent cardiac catheterization 07/09/2020 which showed mild to moderate multivessel coronary artery disease.  Cardiology recommended aspirin and Plavix in addition to Imdur as well as risk factor modification.  Patient improved clinically.  Patient had no further chest pain during the hospitalization be discharged home in stable and improved condition.  Outpatient follow-up with PCP and cardiology.  2.  Hyperlipidemia Fasting lipid panel with LDL of 120.  Patient on Crestor 10 mg weekly.    Patient's Crestor was changed to 5 mg daily.  Outpatient follow-up.   3.  Hypertension Patient stated recently started on clonidine however unable to tolerate side effects of dizziness, lightheadedness, gait instability, now feels it is affecting his kidneys.  Clonidine discontinued during this hospitalization likely will not resume on discharge.   Patient maintained on Avapro, Microzide chlorthalidone.  Imdur and hydralazine were added to patient's regimen.  Patient's Toprol-XL dose  was increased to 50 mg daily per cardiology for better blood pressure control.  Outpatient follow-up with cardiology and PCP.  4.  Bradycardia Questionable etiology.  Concern may be secondary to antihypertensive medications.  Patient noted to have recently been started  on clonidine.  Patient unable to tolerate clonidine as he stated he had some balance issues, dizziness and lightheadedness.    Clonidine and metoprolol were held during the hospitalization.  Beta-blocker was resumed on discharge.  Clonidine discontinued.  Outpatient follow-up.    5.  Coronary artery disease status post stent to RCA See problem #1.    On presentation was maintained on aspirin, statin, ARB, diuretic, hydralazine, Imdur.  Fasting lipid panel with LDL of 120.  Patient was on Crestor 10 mg weekly which was subsequently changed to Crestor 5 mg daily.  Cardiology was consulted and patient underwent cardiac catheterization on 07/09/2020 which showed mild to moderate multivessel coronary artery disease.  Imdur, aspirin and Plavix was added to patient's regimen and cardiology had recommended lifestyle modification in addition.  Patient improved clinically.  Patient had no further chest pain.  Outpatient follow-up with cardiology 2 weeks post discharge.   6.  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  Risk factor modification.  Strict blood pressure management.  Changed statin from Crestor 10 mg weekly to Crestor 5 mg daily.  Outpatient follow-up.   Procedures:  2D echo  Chest x-ray 07/07/2020  Cardiac catheterization per Dr. Doylene Canard 07/09/2020  Consultations:  Cardiology: Dr. Doylene Canard 07/08/2020  Discharge Exam: Vitals:   07/10/20 0928 07/10/20 1231  BP: (!) 153/55 (!) 128/48  Pulse: 75 64  Resp:  16  Temp: 98.7 F (37.1 C) 98 F (36.7 C)  SpO2: 95% 96%    General: NAD Cardiovascular: RRR Respiratory: CTAB  Discharge Instructions   Discharge Instructions    Diet - low sodium heart healthy   Complete by: As directed    Increase activity slowly   Complete by: As directed      Allergies as of 07/10/2020      Reactions   Norvasc [amlodipine Besylate] Other (See Comments)   CAUSES TEETH TO FALL OUT      Medication  List    STOP taking these medications   cloNIDine 0.2 MG tablet Commonly known as: CATAPRES     TAKE these medications   aspirin 81 MG tablet Take 1 tablet (81 mg total) by mouth daily.   chlorthalidone 25 MG tablet Commonly known as: HYGROTON Take 0.5 tablets (12.5 mg total) by mouth daily.   clopidogrel 75 MG tablet Commonly known as: PLAVIX Take 1 tablet (75 mg total) by mouth daily with breakfast. Start taking on: July 11, 2020   Co Q-10 200 MG Caps Take 200 mg by mouth daily.   hydrALAZINE 25 MG tablet Commonly known as: APRESOLINE Take 1 tablet (25 mg total) by mouth every 8 (eight) hours.   isosorbide mononitrate 60 MG 24 hr tablet Commonly known as: IMDUR Take 1 tablet (60 mg total) by mouth daily. Start taking on: July 11, 2020   metoprolol succinate 50 MG 24 hr tablet Commonly known as: TOPROL-XL Take 1 tablet (50 mg total) by mouth daily. Take with or immediately following a meal. Start taking on: July 11, 2020 What changed:   medication strength  how much to take  when to take this  additional instructions   nitroGLYCERIN 0.4 MG SL tablet Commonly known as: NITROSTAT Place 1 tablet (  0.4 mg total) under the tongue every 5 (five) minutes as needed.   Repatha SureClick 500 MG/ML Soaj Generic drug: Evolocumab Inject 1 mL into the skin every 30 (thirty) days.   rosuvastatin 5 MG tablet Commonly known as: CRESTOR Take 1 tablet (5 mg total) by mouth daily at 6 PM. What changed:   medication strength  how much to take  when to take this   telmisartan-hydrochlorothiazide 80-12.5 MG tablet Commonly known as: MICARDIS HCT Take 1 tablet by mouth every morning.      Allergies  Allergen Reactions  . Norvasc [Amlodipine Besylate] Other (See Comments)    CAUSES TEETH TO FALL OUT    Follow-up Information    Dixie Dials, MD. Schedule an appointment as soon as possible for a visit in 2 week(s).   Specialty: Cardiology Contact  information: [place] [address] [phone]        Maury Dus, MD. Schedule an appointment as soon as possible for a visit in 2 week(s).   Specialty: Family Medicine Why: f/u in 2-3 weeks. Contact information: [address] [place] [address] [phone]                The results of significant diagnostics from this hospitalization (including imaging, microbiology, ancillary and laboratory) are listed below for reference.    Significant Diagnostic Studies: DG Chest 2 View  Result Date: 07/07/2020 CLINICAL DATA:  Chest pain EXAM: CHEST - 2 VIEW COMPARISON:  12/19/2013 chest radiograph. FINDINGS: Stable cardiomediastinal silhouette with normal heart size. No pneumothorax. No pleural effusion. Lungs appear clear, with no acute consolidative airspace disease and no pulmonary edema. IMPRESSION: No active cardiopulmonary disease. Electronically Signed   By: Ilona Sorrel M.D.   On: 07/07/2020 19:16   CARDIAC CATHETERIZATION  Result Date: 07/10/2020  Ost LAD to Prox LAD lesion is 50% stenosed.  Mid LAD to Dist LAD lesion is 50% stenosed.  Ost RCA lesion is 40% stenosed.  Prox RCA lesion is 40% stenosed.  Mid RCA lesion is 40% stenosed.  2nd Diag lesion is 50% stenosed.  2nd Mrg lesion is 60% stenosed.  Mid Cx lesion is 30% stenosed.  Mild to moderate multivessel disease. Add isosorbide and ASA and Plavix for 6 months and life style modification.   ECHOCARDIOGRAM COMPLETE  Result Date: 07/08/2020    ECHOCARDIOGRAM REPORT   Patient Name:   AUBREY VOONG [facility] Date of Exam: 07/08/2020 Medical Rec #:  [id number]      Height:       69.0 in Accession #:    [id number]     Weight:       160.0 lb Date of Birth:  Jun 03, 1941      BSA:          1.879 m Patient Age:    7 years       BP:           193/70 mmHg Patient Gender: M              HR:           58 bpm. Exam Location:  Inpatient Procedure: 2D Echo, Cardiac Doppler and Color Doppler  Indications:     R07.9* Chest pain, unspecified  History:         Patient has prior history of Echocardiogram examinations, most                  recent 10/12/2018. CAD; Risk Factors:Hypertension and  Dyslipidemia.  Sonographer:     Jonelle Sidle Dance Referring Phys:  [place] Diagnosing Phys: Dixie Dials MD IMPRESSIONS  1. Left ventricular ejection fraction, by estimation, is 55 to 60%. The left ventricle has normal function. The left ventricle has no regional wall motion abnormalities. Left ventricular diastolic parameters are consistent with Grade I diastolic dysfunction (impaired relaxation).  2. Right ventricular systolic function is normal. The right ventricular size is normal.  3. Left atrial size was mildly dilated.  4. The mitral valve is degenerative. Mild mitral valve regurgitation.  5. The aortic valve is tricuspid. Aortic valve regurgitation is trivial. Mild aortic valve sclerosis is present, with no evidence of aortic valve stenosis.  6. There is mild (Grade II) atheroma plaque involving the ascending aorta.  7. The inferior vena cava is normal in size with greater than 50% respiratory variability, suggesting right atrial pressure of 3 mmHg. FINDINGS  Left Ventricle: Left ventricular ejection fraction, by estimation, is 55 to 60%. The left ventricle has normal function. The left ventricle has no regional wall motion abnormalities. The left ventricular internal cavity size was normal in size. There is  borderline concentric left ventricular hypertrophy. Left ventricular diastolic parameters are consistent with Grade I diastolic dysfunction (impaired relaxation). Right Ventricle: The right ventricular size is normal. No increase in right ventricular wall thickness. Right ventricular systolic function is normal. Left Atrium: Left atrial size was mildly dilated. Right Atrium: Right atrial size was normal in size. Pericardium: There is no evidence of pericardial effusion. Mitral  Valve: The mitral valve is degenerative in appearance. There is mild thickening of the mitral valve leaflet(s). Mild mitral annular calcification. Mild mitral valve regurgitation. Tricuspid Valve: The tricuspid valve is normal in structure. Tricuspid valve regurgitation is mild. Aortic Valve: The aortic valve is tricuspid. . There is mild thickening and mild calcification of the aortic valve. Aortic valve regurgitation is trivial. Aortic regurgitation PHT measures 1925 msec. Mild aortic valve sclerosis is present, with no evidence of aortic valve stenosis. Mild aortic valve annular calcification. There is mild thickening of the aortic valve. There is mild calcification of the aortic valve. Pulmonic Valve: The pulmonic valve was normal in structure. Pulmonic valve regurgitation is not visualized. Aorta: The aortic root is normal in size and structure. There is mild (Grade II) atheroma plaque involving the ascending aorta. Venous: The inferior vena cava is normal in size with greater than 50% respiratory variability, suggesting right atrial pressure of 3 mmHg. IAS/Shunts: The interatrial septum was not assessed.  LEFT VENTRICLE PLAX 2D LVIDd:         4.80 cm  Diastology LVIDs:         3.20 cm  LV e' lateral:   8.16 cm/s LV PW:         1.20 cm  LV E/e' lateral: 8.7 LV IVS:        1.10 cm  LV e' medial:    8.38 cm/s LVOT diam:     2.10 cm  LV E/e' medial:  8.5 LV SV:         89 LV SV Index:   48 LVOT Area:     3.46 cm  RIGHT VENTRICLE            IVC RV Basal diam:  2.40 cm    IVC diam: 1.80 cm RV S prime:     9.36 cm/s TAPSE (M-mode): 1.4 cm LEFT ATRIUM  Index       RIGHT ATRIUM           Index LA diam:        4.00 cm 2.13 cm/m  RA Area:     11.60 cm LA Vol (A2C):   76.4 ml 40.66 ml/m RA Volume:   18.50 ml  9.85 ml/m LA Vol (A4C):   36.1 ml 19.21 ml/m LA Biplane Vol: 55.8 ml 29.70 ml/m  AORTIC VALVE LVOT Vmax:   116.00 cm/s LVOT Vmean:  81.500 cm/s LVOT VTI:    0.258 m AI PHT:      1925 msec  AORTA Ao  Root diam: 3.70 cm Ao Asc diam:  3.20 cm MITRAL VALVE MV Area (PHT): 2.20 cm    SHUNTS MV Decel Time: 345 msec    Systemic VTI:  0.26 m MV E velocity: 71.00 cm/s  Systemic Diam: 2.10 cm MV A velocity: 90.00 cm/s MV E/A ratio:  0.79 Dixie Dials MD Electronically signed by Dixie Dials MD Signature Date/Time: 07/08/2020/3:08:23 PM    Final     Microbiology: Recent Results (from the past 240 hour(s))  SARS Coronavirus 2 by RT PCR (hospital order, performed in [place] hospital lab) Nasopharyngeal Nasopharyngeal Swab     Status: None   Collection Time: 07/07/20  7:36 PM   Specimen: Nasopharyngeal Swab  Result Value Ref Range Status   SARS Coronavirus 2 NEGATIVE NEGATIVE Final    Comment: (NOTE) SARS-CoV-2 target nucleic acids are NOT DETECTED.  The SARS-CoV-2 RNA is generally detectable in upper and lower respiratory specimens during the acute phase of infection. The lowest concentration of SARS-CoV-2 viral copies this assay can detect is 250 copies / mL. A negative result does not preclude SARS-CoV-2 infection and should not be used as the sole basis for treatment or other patient management decisions.  A negative result may occur with improper specimen collection / handling, submission of specimen other than nasopharyngeal swab, presence of viral mutation(s) within the areas targeted by this assay, and inadequate number of viral copies (<250 copies / mL). A negative result must be combined with clinical observations, patient history, and epidemiological information.  Fact Sheet for Patients:   StrictlyIdeas.no  Fact Sheet for Healthcare Providers: BankingDealers.co.za  This test is not yet approved or  cleared by the [place] FDA and has been authorized for detection and/or diagnosis of SARS-CoV-2 by FDA under an Emergency Use Authorization (EUA).  This EUA will remain in effect (meaning this test can be used) for the duration of  the COVID-19 declaration under Section 564(b)(1) of the Act, 21 U.S.C. section 360bbb-3(b)(1), unless the authorization is terminated or revoked sooner.  Performed at [facility], [address] [place], [place] [address]   Culture, Urine     Status: Abnormal   Collection Time: 07/08/20 11:39 AM   Specimen: Urine, Random  Result Value Ref Range Status   Specimen Description   Final    URINE, RANDOM Performed at [facility] [address] [place], [place] [address]    Special Requests   Final    NONE Performed at [facility], [place] [address] [place],  [address]    Culture (A)  Final    70,000 COLONIES/mL MULTIPLE SPECIES PRESENT, SUGGEST RECOLLECTION   Report Status 07/09/2020 FINAL  Final     Labs: Basic Metabolic Panel: Recent Labs  Lab 07/07/20 1801 07/08/20 0824 07/09/20 0757  NA 137 140 140  K 3.7  3.5 3.9  CL 103 104 109  CO2 23 26 22   GLUCOSE 107* 116* 117*  BUN 32* 25* 31*  CREATININE 1.01 0.81 1.00  CALCIUM 8.7* 9.2 8.9  MG  --  2.3 2.3   Liver Function Tests: No results for input(s): AST, ALT, ALKPHOS, BILITOT, PROT, ALBUMIN in the last 168 hours. No results for input(s): LIPASE, AMYLASE in the last 168 hours. No results for input(s): AMMONIA in the last 168 hours. CBC: Recent Labs  Lab 07/07/20 1801 07/08/20 1045 07/09/20 0757 07/10/20 0527  WBC 5.9 6.9 8.3 7.9  HGB 13.1 14.9 13.4 13.3  HCT 40.1 45.7 40.0 40.6  MCV 88.3 87.7 87.3 88.1  PLT 113* 130* 118* 122*   Cardiac Enzymes: No results for input(s): CKTOTAL, CKMB, CKMBINDEX, TROPONINI in the last 168 hours. BNP: BNP (last 3 results) No results for input(s): BNP in the last 8760 hours.  ProBNP (last 3 results) No results for input(s): PROBNP in the last 8760 hours.  CBG: No results for input(s): GLUCAP in the last 168 hours.     Signed:  Irine Seal MD.  [facility] 07/10/2020, 3:57 PM

## 2020-07-10 NOTE — Consult Note (Signed)
Ref: Maury Dus, MD   Subjective:  Awake. No chest pain. Mild swelling of right forearm. Right radial cath site is without swelling or pain.  Patient used to take Crestor 10 mg. once a week. He is willing to take it as 5 mg. daily for LDL cholesterol of 120 mg. High dose of rosuvastatin gives him myalgia.  Objective:  Vital Signs in the last 24 hours: Temp:  [97.9 F (36.6 C)-98.7 F (37.1 C)] 97.9 F (36.6 C) (08/24 0544) Pulse Rate:  [53-100] 73 (08/24 0544) Cardiac Rhythm: Normal sinus rhythm (08/23 1900) Resp:  [9-49] 18 (08/24 0544) BP: (121-226)/(62-110) 174/74 (08/24 0544) SpO2:  [96 %-100 %] 97 % (08/24 0544)  Physical Exam: BP Readings from Last 1 Encounters:  07/10/20 (!) 174/74     Wt Readings from Last 1 Encounters:  07/07/20 72.6 kg    Weight change:  Body mass index is 23.63 kg/m. HEENT: [place]/AT, Eyes-Brown, PERL, EOMI, Conjunctiva-Pink, Sclera-Non-icteric Neck: No JVD, No bruit, Trachea midline. Lungs:  Clear, Bilateral. Cardiac:  Regular rhythm, normal S1 and S2, no S3. II/VI systolic murmur. Abdomen:  Soft, non-tender. BS present. Extremities:  No edema present. No cyanosis. No clubbing. CNS: AxOx3, Cranial nerves grossly intact, moves all 4 extremities.  Skin: Warm and dry.   Intake/Output from previous day: 08/23 0701 - 08/24 0700 In: -  Out: 750 [Urine:750]    Lab Results: BMET    Component Value Date/Time   NA 140 07/09/2020 0757   NA 140 07/08/2020 0824   NA 137 07/07/2020 1801   NA 142 10/25/2018 1134   NA 141 10/08/2018 0931   K 3.9 07/09/2020 0757   K 3.5 07/08/2020 0824   K 3.7 07/07/2020 1801   CL 109 07/09/2020 0757   CL 104 07/08/2020 0824   CL 103 07/07/2020 1801   CO2 22 07/09/2020 0757   CO2 26 07/08/2020 0824   CO2 23 07/07/2020 1801   GLUCOSE 117 (H) 07/09/2020 0757   GLUCOSE 116 (H) 07/08/2020 0824   GLUCOSE 107 (H) 07/07/2020 1801   BUN 31 (H) 07/09/2020 0757   BUN 25 (H) 07/08/2020 0824   BUN 32 (H) 07/07/2020  1801   BUN 27 10/25/2018 1134   BUN 30 (H) 10/08/2018 0931   CREATININE 1.00 07/09/2020 0757   CREATININE 0.81 07/08/2020 0824   CREATININE 1.01 07/07/2020 1801   CALCIUM 8.9 07/09/2020 0757   CALCIUM 9.2 07/08/2020 0824   CALCIUM 8.7 (L) 07/07/2020 1801   GFRNONAA >60 07/09/2020 0757   GFRNONAA >60 07/08/2020 0824   GFRNONAA >60 07/07/2020 1801   GFRAA >60 07/09/2020 0757   GFRAA >60 07/08/2020 0824   GFRAA >60 07/07/2020 1801   CBC    Component Value Date/Time   WBC 7.9 07/10/2020 0527   RBC 4.61 07/10/2020 0527   HGB 13.3 07/10/2020 0527   HGB 14.8 10/25/2018 1134   HCT 40.6 07/10/2020 0527   HCT 44.1 10/25/2018 1134   PLT 122 (L) 07/10/2020 0527   PLT 148 (L) 10/25/2018 1134   MCV 88.1 07/10/2020 0527   MCV 87 10/25/2018 1134   MCH 28.9 07/10/2020 0527   MCHC 32.8 07/10/2020 0527   RDW 12.6 07/10/2020 0527   RDW 13.0 10/25/2018 1134   LYMPHSABS 1.3 10/25/2018 1134   EOSABS 0.1 10/25/2018 1134   BASOSABS 0.1 10/25/2018 1134   HEPATIC Function Panel No results for input(s): PROT in the last 8760 hours.  Invalid input(s):  ALBUMIN,  AST,  ALT,  ALKPHOS,  BILIDIR,  IBILI HEMOGLOBIN A1C No components found for: HGA1C,  MPG CARDIAC ENZYMES No results found for: CKTOTAL, CKMB, CKMBINDEX, TROPONINI BNP No results for input(s): PROBNP in the last 8760 hours. TSH No results for input(s): TSH in the last 8760 hours. CHOLESTEROL Recent Labs    07/09/20 0757  CHOL 177    Scheduled Meds: . aspirin EC  81 mg Oral Daily  . chlorthalidone  12.5 mg Oral Daily  . clopidogrel  75 mg Oral Q breakfast  . hydrALAZINE  25 mg Oral Q8H  . irbesartan  300 mg Oral Daily   And  . hydrochlorothiazide  12.5 mg Oral Daily  . isosorbide mononitrate  60 mg Oral Daily  . rosuvastatin  5 mg Oral q1800  . sodium chloride flush  3 mL Intravenous Q12H   Continuous Infusions: . sodium chloride     PRN Meds:.sodium chloride, acetaminophen, hydrALAZINE, hydrALAZINE, nitroGLYCERIN,  ondansetron (ZOFRAN) IV, sodium chloride flush  Assessment/Plan: Unstable angina Multivessel, native vessel CAD Hypertension Hyperlipidemia AAA  Add B-blocker. Increase activity. Keep right hand at chest level. F/U in 2 weeks.   LOS: 0 days   Time spent including chart review, lab review, examination, discussion with patient : 30 min   Dixie Dials  MD  07/10/2020, 7:20 AM

## 2020-07-10 NOTE — Progress Notes (Signed)
AVS teaching completed. Pt discharged to home in stable, ambulatory condition accompanied by wife.  Coolidge Breeze, RN 07/10/2020

## 2020-10-02 ENCOUNTER — Telehealth: Payer: Self-pay | Admitting: *Deleted

## 2020-10-02 ENCOUNTER — Other Ambulatory Visit: Payer: Self-pay | Admitting: *Deleted

## 2020-10-02 DIAGNOSIS — I10 Essential (primary) hypertension: Secondary | ICD-10-CM

## 2020-10-02 NOTE — Telephone Encounter (Signed)
Please contact Ricardo [name] in monitors to schedule a 24 hour ambulatory blood pressure monitor.

## 2020-10-03 ENCOUNTER — Telehealth: Payer: Self-pay | Admitting: Family Medicine

## 2020-10-03 NOTE — Telephone Encounter (Signed)
Called pt for new referral.  Pt stated he would like to switch from Dr Geraldo Pitter to another provider at the [facility] location.  Pt would like a 2nd option   Best number [phone]

## 2020-10-04 NOTE — Telephone Encounter (Signed)
That is fine with me.  I saw him 2 years ago.!!

## 2020-10-09 NOTE — Telephone Encounter (Signed)
Ricardo [name] the patient's daughter is returning [facility] call. Please advise.

## 2020-10-15 ENCOUNTER — Other Ambulatory Visit: Payer: Self-pay

## 2020-10-15 ENCOUNTER — Ambulatory Visit (INDEPENDENT_AMBULATORY_CARE_PROVIDER_SITE_OTHER): Payer: Medicare Other

## 2020-10-15 ENCOUNTER — Other Ambulatory Visit: Payer: Self-pay | Admitting: Family Medicine

## 2020-10-15 DIAGNOSIS — I129 Hypertensive chronic kidney disease with stage 1 through stage 4 chronic kidney disease, or unspecified chronic kidney disease: Secondary | ICD-10-CM

## 2020-10-15 DIAGNOSIS — I10 Essential (primary) hypertension: Secondary | ICD-10-CM

## 2020-10-29 ENCOUNTER — Telehealth: Payer: Self-pay | Admitting: Cardiovascular Disease

## 2020-10-29 NOTE — Telephone Encounter (Signed)
New Message:     This pt would like to switch to you for a second opinion- Dr Geraldo Pitter have agreed to the switch. Message went to timeout

## 2020-10-29 NOTE — Telephone Encounter (Signed)
That is fine.   [place] T. Audie Box, [place]  [address], [place], [place] [address] [phone]  2:17 PM

## 2020-11-18 NOTE — Progress Notes (Unsigned)
Cardiology Office Note:   Date:  11/20/2020  NAME:  Ricardo [name]    MRN: [id number] DOB:  [birth year]   PCP:  Elias Else, MD  Cardiologist:  No primary care provider on file.   Referring MD: Elias Else, MD   Chief Complaint  Patient presents with  . Coronary Artery Disease   History of Present Illness:   Ricardo [name] is a 80 y.o. male with a hx of CAD, AAA, HTN, HLD who is being seen today for the evaluation of CAD at the request of Elias Else, MD. Recent admission 06/2020 for unstable angina. LHC with diffuse moderate CAD. Managed medically.   He presents to establish with our practice. From a CAD standpoint he is stable. Admitted in August 2021 with unstable angina. He has done well since that admission. Remains on aspirin and Plavix. He denies any chest pain or shortness of breath. He does not exercise actively but has no limitations with exertional angina. He is on aspirin Plavix. His most recent LDL cholesterol is 120. He is on Repatha. He takes Crestor 5 mg weekly. We discussed adding Zetia. He is okay to do this.  Blood pressure is an issue. He presents with a 24-hour blood pressure log which shows average blood pressure around 150 mmHg. Blood pressure today in office 170/83. He also was noted to have bradycardia on recent monitor. He is also had episodes of dizziness. They can occur with standing. He also presents with his daughter who reports about 2 weeks ago he had an episode where he could not move his left leg. He was profoundly weak and did not go to the emergency room. Symptoms resolved in 30 minutes to 1 hour. I do have concerns he possibly had a stroke. He has no deficits. No further symptoms of this. We discussed proceeding with a brain MRI which she is okay to do.  Regarding blood pressure he is on chlorthalidone 12.5 mg a day, hydralazine 25 mg a day, 24 clonidine patch 0.2 mg weekly, Imdur 60 mg a day, metoprolol succinate 50 mg a day, telmisartan 80 mg a  day.  He also has a AAA of 3.6 cm. This was performed in October of last year. He will need yearly surveillance on this.  Problem List 1. Moderate CAD -Prior RCA stent 2001 -50% LAD -40% RCA -60% OM2 2. AAA -3.6 cm 2020 3. HLD -T chol 177, HDL 42, LDL 120, TG 76  Past Medical History: Past Medical History:  Diagnosis Date  . Abdominal aneurysm (HCC) 08/30/2018  . Arthritis    SHOULDERS, NECK , RIGHT HIP  . Bladder cancer (HCC) DX   OCT 2011--  FOLLOWED BY DR WOODRUFF   S/P TURBT,  HIGH GRADE BLADDER CANCER  . CAD (coronary artery disease), native coronary artery 08/30/2018   normal Left main, 30% stenosis ostial LAD, 50% stenosis OM 3, 80% stenosis mid RCA;  2.5 x 8 mm Tetra Stent Dr. Algie Coffer  . Coronary artery disease CARDIOLOGIST- DR Donnie Aho-  LAST VISIT 2 WKS AGO-- WILL REQUEST NOTE AND EKG   STRESS TEST -- JULY 2009  . Essential hypertension 08/30/2018  . Hyperlipidemia   . Hypertension   . Nocturia   . Personal history of malignant neoplasm of bladder 08/30/2018  . Post PTCA 2001-   X1 STENT TO RCA   AND 1996    Past Surgical History: Past Surgical History:  Procedure Laterality Date  . CORONARY ANGIOPLASTY  1996  . CORONARY ANGIOPLASTY WITH  STENT PLACEMENT  2001   STENT TO RCA  . CYSTO/ BLADDER BX  10-22-10  . CYSTOSCOPY W/ RETROGRADES  11/12/2011   Procedure: CYSTOSCOPY WITH RETROGRADE PYELOGRAM;  Surgeon: Molli Hazard, MD;  Location: [facility];  Service: Urology;  Laterality: Bilateral;  . LEFT HEART CATH AND CORONARY ANGIOGRAPHY N/A 07/09/2020   Procedure: LEFT HEART CATH AND CORONARY ANGIOGRAPHY;  Surgeon: Dixie Dials, MD;  Location: [facility];  Service: Cardiovascular;  Laterality: N/A;  . LUMBAR FUSION    . TRANSURETHRAL RESECTION OF BLADDER TUMOR  08-27-10   RIGHT ANTERIOR DOME  . TRANSURETHRAL RESECTION OF BLADDER TUMOR  11/12/2011   Procedure: TRANSURETHRAL RESECTION OF BLADDER TUMOR (TURBT);  Surgeon: Molli Hazard, MD;  Location[facility];  Service: Urology;  Laterality: N/A;  with PK Gyrus c-arm gyrus  digital ureteroscope    Current Medications: Current Meds  Medication Sig  . cloNIDine (CATAPRES - DOSED IN MG/24 HR) 0.2 mg/24hr patch Place 0.2 mg onto the skin once a week.  . ezetimibe (ZETIA) 10 MG tablet Take 1 tablet (10 mg total) by mouth daily.  Marland Kitchen spironolactone (ALDACTONE) 25 MG tablet Take 1 tablet (25 mg total) by mouth daily.  Marland Kitchen telmisartan (MICARDIS) 80 MG tablet Take 80 mg by mouth daily.     Allergies:    Norvasc [amlodipine besylate]   Social History: Social History   Socioeconomic History  . Marital status: Married    Spouse name: Not on file  . Number of children: 4  . Years of education: Not on file  . Highest education level: Not on file  Occupational History  . Occupation: retired - Probation officer  Tobacco Use  . Smoking status: Former Smoker    Packs/day: 1.50    Years: 5.00    Pack years: 7.50    Types: Cigarettes    Quit date: 11/05/1965    Years since quitting: 55.0  . Smokeless tobacco: Never Used  Substance and Sexual Activity  . Alcohol use: No  . Drug use: No  . Sexual activity: Not on file  Other Topics Concern  . Not on file  Social History Narrative  . Not on file   Social Determinants of Health   Financial Resource Strain: Not on file  Food Insecurity: Not on file  Transportation Needs: Not on file  Physical Activity: Not on file  Stress: Not on file  Social Connections: Not on file    Family History: The patient's family history includes Diabetes in his mother; Hypertension in his mother.  ROS:   All other ROS reviewed and negative. Pertinent positives noted in the HPI.     EKGs/Labs/Other Studies Reviewed:   The following studies were personally reviewed by me today:  EKG:  EKG dated 07/10/2020 was personally reviewed in office and demonstrates normal sinus rhythm heart rate 66, no acute ischemic  changes, no evidence of prior infarction.  [place] AAA 08/2019  IMPRESSION: 3.6 cm distal abdominal aortic aneurysm which is increased compared to prior exam. Recommend followup by [place] in 2 years. This recommendation follows ACR consensus guidelines: White Paper of the ACR Incidental Findings Committee II on Vascular Findings. J Am Coll Radiol 2013; 10:789-794.  TTE 07/08/2020 1. Left ventricular ejection fraction, by estimation, is 55 to 60%. The  left ventricle has normal function. The left ventricle has no regional  wall motion abnormalities. Left ventricular diastolic parameters are  consistent with Grade I diastolic  dysfunction (impaired  relaxation).  2. Right ventricular systolic function is normal. The right ventricular  size is normal.  3. Left atrial size was mildly dilated.  4. The mitral valve is degenerative. Mild mitral valve regurgitation.  5. The aortic valve is tricuspid. Aortic valve regurgitation is trivial.  Mild aortic valve sclerosis is present, with no evidence of aortic valve  stenosis.  6. There is mild (Grade II) atheroma plaque involving the ascending  aorta.  7. The inferior vena cava is normal in size with greater than 50%  respiratory variability, suggesting right atrial pressure of 3 mmHg.   LHC 07/09/2020   Ost LAD to Prox LAD lesion is 50% stenosed.  Mid LAD to Dist LAD lesion is 50% stenosed.  Ost RCA lesion is 40% stenosed.  Prox RCA lesion is 40% stenosed.  Mid RCA lesion is 40% stenosed.  2nd Diag lesion is 50% stenosed.  2nd Mrg lesion is 60% stenosed.  Mid Cx lesion is 30% stenosed.   Mild to moderate multivessel disease. Add isosorbide and ASA and Plavix for 6 months and life style modification.   Recent Labs: 07/09/2020: BUN 31; Creatinine, Ser 1.00; Magnesium 2.3; Potassium 3.9; Sodium 140 07/10/2020: Hemoglobin 13.3; Platelets 122   Recent Lipid Panel    Component Value Date/Time   CHOL 177 07/09/2020 0757   CHOL 168  10/25/2018 1134   TRIG 76 07/09/2020 0757   HDL 42 07/09/2020 0757   HDL 65 10/25/2018 1134   CHOLHDL 4.2 07/09/2020 0757   VLDL 15 07/09/2020 0757   LDLCALC 120 (H) 07/09/2020 0757   LDLCALC 82 10/25/2018 1134    Physical Exam:   VS:  BP (!) 170/83   Pulse 60   Temp 99.9 F (37.7 C)   Ht 5\' 9"  (1.753 m)   Wt 175 lb 12.8 oz (79.7 kg)   SpO2 95%   BMI 25.96 kg/m    Wt Readings from Last 3 Encounters:  11/20/20 175 lb 12.8 oz (79.7 kg)  07/07/20 160 lb (72.6 kg)  10/22/18 170 lb (77.1 kg)    General: Well nourished, well developed, in no acute distress Head: Atraumatic, normal size  Eyes: PEERLA, EOMI  Neck: Supple, no JVD Endocrine: No thryomegaly Cardiac: Normal S1, S2; RRR; no murmurs, rubs, or gallops Lungs: Clear to auscultation bilaterally, no wheezing, rhonchi or rales  Abd: Soft, nontender, no hepatomegaly  Ext: No edema, pulses 2+ Musculoskeletal: No deformities, BUE and BLE strength normal and equal Skin: Warm and dry, no rashes   Neuro: Alert and oriented to person, place, time, and situation, CNII-XII grossly intact, no focal deficits  Psych: Normal mood and affect   ASSESSMENT:   SEMAJA STEFFEK is a 80 y.o. male who presents for the following: 1. Coronary artery disease involving native coronary artery of native heart without angina pectoris   2. Mixed hyperlipidemia   3. Primary hypertension   4. Dizziness   5. AAA (abdominal aortic aneurysm) without rupture ([place])     PLAN:   1. Coronary artery disease involving native coronary artery of native heart without angina pectoris 2. Mixed hyperlipidemia -Prior RCA stent 2001 -50% LAD -40% RCA -60% OM2 -No symptoms of angina. Given diffuse CAD and recent non-STEMI in August we will continue aspirin Plavix. -Most recent LDL cholesterol 120. Not at goal. Can only take Crestor 5 mg weekly. He is on Repatha. We will add Zetia. He will see me back in 3 months with a repeat lipid profile 1 week before. We need  to get his LDL cholesterol less than 70.  3. Primary hypertension -BP has been quite elevated. Also with low heart rate. We will stop metoprolol succinate. We will add Aldactone 25 mg a day. He will continue chlorthalidone 12.5 mg daily, clonidine patch, hydralazine 25 mg 3 times daily, telmisartan 80 mg daily. He will continue to keep a blood pressure log. He was given information about salty foods to avoid. -We will likely pursue renal artery duplexes if blood pressure is not under control when I see him in 3 months.   4. Dizziness -He describes episodic dizziness upon standing which is likely orthostasis. He also reports focal deficit 2 weeks ago. Symptoms resolved. He possibly had a TIA. I would like for him to get carotid ultrasound as well as a brain MRI. Bradycardia was mentioned on his 24-hour blood pressure monitor. I will have to complete a 7-day Zio patch to exclude any significant bradycardia. -Symptoms ultimately could be blood pressure related. We will try to get his blood pressure under better control with addition of Aldactone.  5. AAA (abdominal aortic aneurysm) without rupture (HCC) -3.6 cm in October 2020. He will need repeat yearly.  Disposition: Return in about 3 months (around 02/18/2021).  Medication Adjustments/Labs and Tests Ordered: Current medicines are reviewed at length with the patient today.  Concerns regarding medicines are outlined above.  Orders Placed This Encounter  Procedures  . MR Brain W Wo Contrast  . Lipid panel  . VAS US CAROTID   Meds ordered this encounter  Medications  . spironolactone (ALDACTONE) 25 MG tablet    Sig: Take 1 tablet (25 mg total) by mouth daily.    Dispense:  90 tablet    Refill:  3  . ezetimibe (ZETIA) 10 MG tablet    Sig: Take 1 tablet (10 mg total) by mouth daily.    Dispense:  90 tablet    Refill:  3    Patient Instructions  Medication Instructions:  Stop Metoprolol Start Aldactone 25 mg daily  Start Zetia 10 mg  daily   *If you need a refill on your cardiac medications before your next appointment, please call your pharmacy*   Lab Work: LIPID (one week before follow up, no appointment needed)  If you have labs (blood work) drawn today and your tests are completely normal, you will receive your results only by: Marland Kitchen MyChart Message (if you have MyChart) OR . A paper copy in the mail If you have any lab test that is abnormal or we need to change your treatment, we will call you to review the results.   Testing/Procedures:  MRI Brain  Your physician has requested that you have a carotid duplex. This test is an ultrasound of the carotid arteries in your neck. It looks at blood flow through these arteries that supply the brain with blood. Allow one hour for this exam. There are no restrictions or special instructions.   Follow-Up: At [facility], you and your health needs are our priority.  As part of our continuing mission to provide you with exceptional heart care, we have created designated Provider Care Teams.  These Care Teams include your primary Cardiologist (physician) and Advanced Practice Providers (APPs -  Physician Assistants and Nurse Practitioners) who all work together to provide you with the care you need, when you need it.  We recommend signing up for the patient portal called "MyChart".  Sign up information is provided on this After Visit Summary.  MyChart is used  to connect with patients for Virtual Visits (Telemedicine).  Patients are able to view lab/test results, encounter notes, upcoming appointments, etc.  Non-urgent messages can be sent to your provider as well.   To learn more about what you can do with MyChart, go to [email or web address].    Your next appointment:   3 month(s)  The format for your next appointment:   In Person  Provider:   Eleonore Chiquito, MD        Time Spent with Patient: I have spent a total of 35 minutes with patient reviewing hospital  notes, telemetry, EKGs, labs and examining the patient as well as establishing an assessment and plan that was discussed with the patient.  > 50% of time was spent in direct patient care.  Signed, Addison Naegeli. Audie Box, [place]  [address], [place], [place] [address] [phone]  11/20/2020 10:09 AM

## 2020-11-20 ENCOUNTER — Encounter: Payer: Self-pay | Admitting: Cardiovascular Disease

## 2020-11-20 ENCOUNTER — Other Ambulatory Visit: Payer: Self-pay

## 2020-11-20 ENCOUNTER — Ambulatory Visit (INDEPENDENT_AMBULATORY_CARE_PROVIDER_SITE_OTHER): Payer: Medicare Other | Admitting: Cardiovascular Disease

## 2020-11-20 VITALS — BP 170/83 | HR 60 | Temp 99.9°F | Ht 69.0 in | Wt 175.8 lb

## 2020-11-20 DIAGNOSIS — E782 Mixed hyperlipidemia: Secondary | ICD-10-CM

## 2020-11-20 DIAGNOSIS — R42 Dizziness and giddiness: Secondary | ICD-10-CM

## 2020-11-20 DIAGNOSIS — I714 Abdominal aortic aneurysm, without rupture, unspecified: Secondary | ICD-10-CM

## 2020-11-20 DIAGNOSIS — I2510 Atherosclerotic heart disease of native coronary artery without angina pectoris: Secondary | ICD-10-CM

## 2020-11-20 DIAGNOSIS — I10 Essential (primary) hypertension: Secondary | ICD-10-CM

## 2020-11-20 MED ORDER — SPIRONOLACTONE 25 MG PO TABS: 25.0000 mg | ORAL_TABLET | Freq: Every day | ORAL | 3 refills | Status: DC

## 2020-11-20 MED ORDER — EZETIMIBE 10 MG PO TABS: 10.0000 mg | ORAL_TABLET | Freq: Every day | ORAL | 3 refills | Status: DC

## 2020-11-20 NOTE — Patient Instructions (Addendum)
Medication Instructions:  Stop Metoprolol Start Aldactone 25 mg daily  Start Zetia 10 mg daily   *If you need a refill on your cardiac medications before your next appointment, please call your pharmacy*   Lab Work: LIPID (one week before follow up, no appointment needed)  If you have labs (blood work) drawn today and your tests are completely normal, you will receive your results only by: Marland Kitchen MyChart Message (if you have MyChart) OR . A paper copy in the mail If you have any lab test that is abnormal or we need to change your treatment, we will call you to review the results.   Testing/Procedures:  MRI Brain  Your physician has requested that you have a carotid duplex. This test is an ultrasound of the carotid arteries in your neck. It looks at blood flow through these arteries that supply the brain with blood. Allow one hour for this exam. There are no restrictions or special instructions.   Follow-Up: At [facility], you and your health needs are our priority.  As part of our continuing mission to provide you with exceptional heart care, we have created designated Provider Care Teams.  These Care Teams include your primary Cardiologist (physician) and Advanced Practice Providers (APPs -  Physician Assistants and Nurse Practitioners) who all work together to provide you with the care you need, when you need it.  We recommend signing up for the patient portal called "MyChart".  Sign up information is provided on this After Visit Summary.  MyChart is used to connect with patients for Virtual Visits (Telemedicine).  Patients are able to view lab/test results, encounter notes, upcoming appointments, etc.  Non-urgent messages can be sent to your provider as well.   To learn more about what you can do with MyChart, go to [email or web address].    Your next appointment:   3 month(s)  The format for your next appointment:   In Person  Provider:   Lennie Odor, MD

## 2020-11-27 ENCOUNTER — Other Ambulatory Visit: Payer: Self-pay

## 2020-11-27 ENCOUNTER — Ambulatory Visit (HOSPITAL_COMMUNITY)
Admission: RE | Admit: 2020-11-27 | Discharge: 2020-11-27 | Disposition: A | Discharge status: Home / Self Care | Payer: Medicare Other | Source: Ambulatory Visit | Attending: Cardiovascular Disease | Admitting: Cardiovascular Disease

## 2020-11-27 DIAGNOSIS — R42 Dizziness and giddiness: Secondary | ICD-10-CM | POA: Insufficient documentation

## 2020-11-28 ENCOUNTER — Ambulatory Visit (HOSPITAL_COMMUNITY)
Admission: RE | Admit: 2020-11-28 | Discharge: 2020-11-28 | Disposition: A | Discharge status: Home / Self Care | Payer: Medicare Other | Source: Ambulatory Visit | Attending: Cardiovascular Disease | Admitting: Cardiovascular Disease

## 2020-11-28 DIAGNOSIS — R42 Dizziness and giddiness: Secondary | ICD-10-CM | POA: Insufficient documentation

## 2020-11-28 DIAGNOSIS — G319 Degenerative disease of nervous system, unspecified: Secondary | ICD-10-CM | POA: Diagnosis not present

## 2020-11-28 DIAGNOSIS — G459 Transient cerebral ischemic attack, unspecified: Secondary | ICD-10-CM | POA: Diagnosis not present

## 2020-11-28 DIAGNOSIS — I618 Other nontraumatic intracerebral hemorrhage: Secondary | ICD-10-CM | POA: Diagnosis not present

## 2020-11-28 DIAGNOSIS — I10 Essential (primary) hypertension: Secondary | ICD-10-CM | POA: Diagnosis not present

## 2020-11-28 MED ORDER — GADOBUTROL 1 MMOL/ML IV SOLN
8.0000 mL | Freq: Once | INTRAVENOUS | Status: AC | PRN
  Administered 2020-11-28: 8 mL via INTRAVENOUS

## 2020-12-11 DIAGNOSIS — J40 Bronchitis, not specified as acute or chronic: Secondary | ICD-10-CM | POA: Diagnosis not present

## 2020-12-11 DIAGNOSIS — R0981 Nasal congestion: Secondary | ICD-10-CM | POA: Diagnosis not present

## 2020-12-11 DIAGNOSIS — R059 Cough, unspecified: Secondary | ICD-10-CM | POA: Diagnosis not present

## 2020-12-11 DIAGNOSIS — Z20822 Contact with and (suspected) exposure to covid-19: Secondary | ICD-10-CM | POA: Diagnosis not present

## 2020-12-11 DIAGNOSIS — J329 Chronic sinusitis, unspecified: Secondary | ICD-10-CM | POA: Diagnosis not present

## 2020-12-13 DIAGNOSIS — I25119 Atherosclerotic heart disease of native coronary artery with unspecified angina pectoris: Secondary | ICD-10-CM | POA: Diagnosis not present

## 2020-12-13 DIAGNOSIS — I2510 Atherosclerotic heart disease of native coronary artery without angina pectoris: Secondary | ICD-10-CM | POA: Diagnosis not present

## 2020-12-13 DIAGNOSIS — I129 Hypertensive chronic kidney disease with stage 1 through stage 4 chronic kidney disease, or unspecified chronic kidney disease: Secondary | ICD-10-CM | POA: Diagnosis not present

## 2020-12-13 DIAGNOSIS — E782 Mixed hyperlipidemia: Secondary | ICD-10-CM | POA: Diagnosis not present

## 2020-12-13 DIAGNOSIS — I10 Essential (primary) hypertension: Secondary | ICD-10-CM | POA: Diagnosis not present

## 2020-12-13 DIAGNOSIS — N182 Chronic kidney disease, stage 2 (mild): Secondary | ICD-10-CM | POA: Diagnosis not present

## 2020-12-13 DIAGNOSIS — K219 Gastro-esophageal reflux disease without esophagitis: Secondary | ICD-10-CM | POA: Diagnosis not present

## 2021-02-14 DIAGNOSIS — N182 Chronic kidney disease, stage 2 (mild): Secondary | ICD-10-CM | POA: Diagnosis not present

## 2021-02-14 DIAGNOSIS — I10 Essential (primary) hypertension: Secondary | ICD-10-CM | POA: Diagnosis not present

## 2021-02-14 DIAGNOSIS — E782 Mixed hyperlipidemia: Secondary | ICD-10-CM | POA: Diagnosis not present

## 2021-02-14 DIAGNOSIS — K219 Gastro-esophageal reflux disease without esophagitis: Secondary | ICD-10-CM | POA: Diagnosis not present

## 2021-02-14 DIAGNOSIS — I129 Hypertensive chronic kidney disease with stage 1 through stage 4 chronic kidney disease, or unspecified chronic kidney disease: Secondary | ICD-10-CM | POA: Diagnosis not present

## 2021-02-14 DIAGNOSIS — I25119 Atherosclerotic heart disease of native coronary artery with unspecified angina pectoris: Secondary | ICD-10-CM | POA: Diagnosis not present

## 2021-02-14 DIAGNOSIS — I2510 Atherosclerotic heart disease of native coronary artery without angina pectoris: Secondary | ICD-10-CM | POA: Diagnosis not present

## 2021-02-18 DIAGNOSIS — E782 Mixed hyperlipidemia: Secondary | ICD-10-CM | POA: Diagnosis not present

## 2021-02-18 LAB — LIPID PANEL
Chol/HDL Ratio: 2 ratio (ref 0.0–5.0)
Cholesterol, Total: 100 mg/dL (ref 100–199)
HDL: 50 mg/dL (ref >39)
LDL Chol Calc (NIH): 34 mg/dL (ref 0–99)
Triglycerides: 78 mg/dL (ref 0–149)
VLDL Cholesterol Cal: 16 mg/dL (ref 5–40)

## 2021-02-24 NOTE — Progress Notes (Signed)
Cardiology Office Note:   Date:  02/26/2021  NAME:  Ricardo [name]    MRN: [id number] DOB:  1941/09/24   PCP:  Maury Dus, MD  Cardiologist:  No primary care provider on file.   Referring MD: Maury Dus, MD   Chief Complaint  Patient presents with  . Coronary Artery Disease   History of Present Illness:   Ricardo [name] is a 80 y.o. male with a hx of diffuse CAD, HLD, HTN, AAA who presents for follow-up.  He is doing well.  He is on Repatha.  Most recent LDL cholesterol 34.  This is at goal.  Blood pressure 140/66.  He needs refills on his Crestor, chlorthalidone and telmisartan.  He is walking 1.5 miles every other day.  No chest pain or shortness of breath.  He is doing remarkably well.  Denies any symptoms of angina.  Weight is down.  Apparently he had Covid recently.  Seems to be recovered from that well.  Denies any chest pain or shortness of breath in office today.  Problem List 1. Moderate CAD -Prior RCA stent 2001 -50% LAD -40% RCA -60% OM2 2. AAA -3.6 cm 2020 3. HLD -T chol 100, HDL 50, LDL 34, TG 78 4. HTN  Past Medical History: Past Medical History:  Diagnosis Date  . Abdominal aneurysm ([place]) 08/30/2018  . Arthritis    SHOULDERS, NECK , RIGHT HIP  . Bladder cancer ([place]) DX   OCT 2011--  FOLLOWED BY DR WOODRUFF   S/P TURBT,  HIGH GRADE BLADDER CANCER  . CAD (coronary artery disease), native coronary artery 08/30/2018   normal Left main, 30% stenosis ostial LAD, 50% stenosis OM 3, 80% stenosis mid RCA;  2.5 x 8 mm Tetra Stent Dr. Doylene Canard  . Coronary artery disease CARDIOLOGIST- DR Wynonia Lawman-  LAST VISIT 2 WKS AGO-- WILL REQUEST NOTE AND EKG   STRESS TEST -- JULY 2009  . Essential hypertension 08/30/2018  . Hyperlipidemia   . Hypertension   . Nocturia   . Personal history of malignant neoplasm of bladder 08/30/2018  . Post PTCA 2001-   X1 STENT TO RCA   AND 1996    Past Surgical History: Past Surgical History:  Procedure Laterality Date  . CORONARY  ANGIOPLASTY  1996  . CORONARY ANGIOPLASTY WITH STENT PLACEMENT  2001   STENT TO RCA  . CYSTO/ BLADDER BX  10-22-10  . CYSTOSCOPY W/ RETROGRADES  11/12/2011   Procedure: CYSTOSCOPY WITH RETROGRADE PYELOGRAM;  Surgeon: Molli Hazard, MD;  Location: [facility];  Service: Urology;  Laterality: Bilateral;  . LEFT HEART CATH AND CORONARY ANGIOGRAPHY N/A 07/09/2020   Procedure: LEFT HEART CATH AND CORONARY ANGIOGRAPHY;  Surgeon: Dixie Dials, MD;  Location: [facility];  Service: Cardiovascular;  Laterality: N/A;  . LUMBAR FUSION    . TRANSURETHRAL RESECTION OF BLADDER TUMOR  08-27-10   RIGHT ANTERIOR DOME  . TRANSURETHRAL RESECTION OF BLADDER TUMOR  11/12/2011   Procedure: TRANSURETHRAL RESECTION OF BLADDER TUMOR (TURBT);  Surgeon: Molli Hazard, MD;  Location: [facility];  Service: Urology;  Laterality: N/A;  with PK Gyrus c-arm gyrus  digital ureteroscope    Current Medications: Current Meds  Medication Sig  . aspirin 81 MG tablet Take 1 tablet (81 mg total) by mouth daily.  . cloNIDine (CATAPRES - DOSED IN MG/24 HR) 0.2 mg/24hr patch Place 0.2 mg onto the skin once a week.  . Coenzyme Q10 (CO Q-10) 200 MG CAPS Take  200 mg by mouth daily.  Marland Kitchen ezetimibe (ZETIA) 10 MG tablet Take 1 tablet (10 mg total) by mouth daily.  . hydrALAZINE (APRESOLINE) 25 MG tablet Take 1 tablet (25 mg total) by mouth every 8 (eight) hours.  . isosorbide mononitrate (IMDUR) 60 MG 24 hr tablet Take 1 tablet (60 mg total) by mouth daily.  . nitroGLYCERIN (NITROSTAT) 0.4 MG SL tablet Place 1 tablet (0.4 mg total) under the tongue every 5 (five) minutes as needed.  Marland Kitchen REPATHA SURECLICK 676 MG/ML SOAJ Inject 1 mL into the skin every 30 (thirty) days.  Marland Kitchen spironolactone (ALDACTONE) 25 MG tablet Take 1 tablet (25 mg total) by mouth daily.  . [DISCONTINUED] chlorthalidone (HYGROTON) 25 MG tablet Take 0.5 tablets (12.5 mg total) by mouth daily.  . [DISCONTINUED]  clopidogrel (PLAVIX) 75 MG tablet Take 1 tablet (75 mg total) by mouth daily with breakfast.  . [DISCONTINUED] rosuvastatin (CRESTOR) 5 MG tablet Take 5 mg by mouth daily.  . [DISCONTINUED] telmisartan (MICARDIS) 80 MG tablet Take 80 mg by mouth daily.     Allergies:    Norvasc [amlodipine besylate]   Social History: Social History   Socioeconomic History  . Marital status: Married    Spouse name: Not on file  . Number of children: 4  . Years of education: Not on file  . Highest education level: Not on file  Occupational History  . Occupation: retired - Probation officer  Tobacco Use  . Smoking status: Former Smoker    Packs/day: 1.50    Years: 5.00    Pack years: 7.50    Types: Cigarettes    Quit date: 11/05/1965    Years since quitting: 55.3  . Smokeless tobacco: Never Used  Substance and Sexual Activity  . Alcohol use: No  . Drug use: No  . Sexual activity: Not on file  Other Topics Concern  . Not on file  Social History Narrative  . Not on file   Social Determinants of Health   Financial Resource Strain: Not on file  Food Insecurity: Not on file  Transportation Needs: Not on file  Physical Activity: Not on file  Stress: Not on file  Social Connections: Not on file     Family History: The patient's family history includes Diabetes in his mother; Hypertension in his mother.  ROS:   All other ROS reviewed and negative. Pertinent positives noted in the HPI.     EKGs/Labs/Other Studies Reviewed:   The following studies were personally reviewed by me today:  [facility] 07/10/2020   Ost LAD to Prox LAD lesion is 50% stenosed.  Mid LAD to Dist LAD lesion is 50% stenosed.  Ost RCA lesion is 40% stenosed.  Prox RCA lesion is 40% stenosed.  Mid RCA lesion is 40% stenosed.  2nd Diag lesion is 50% stenosed.  2nd Mrg lesion is 60% stenosed.  Mid Cx lesion is 30% stenosed.   Mild to moderate multivessel disease. Add isosorbide and ASA and Plavix for 6 months and  life style modification.  TTE 07/08/2020 1. Left ventricular ejection fraction, by estimation, is 55 to 60%. The  left ventricle has normal function. The left ventricle has no regional  wall motion abnormalities. Left ventricular diastolic parameters are  consistent with Grade I diastolic  dysfunction (impaired relaxation).  2. Right ventricular systolic function is normal. The right ventricular  size is normal.  3. Left atrial size was mildly dilated.  4. The mitral valve is degenerative. Mild mitral valve regurgitation.  5.  The aortic valve is tricuspid. Aortic valve regurgitation is trivial.  Mild aortic valve sclerosis is present, with no evidence of aortic valve  stenosis.  6. There is mild (Grade II) atheroma plaque involving the ascending  aorta.  7. The inferior vena cava is normal in size with greater than 50%  respiratory variability, suggesting right atrial pressure of 3 mmHg.     Recent Labs: 07/09/2020: BUN 31; Creatinine, Ser 1.00; Magnesium 2.3; Potassium 3.9; Sodium 140 07/10/2020: Hemoglobin 13.3; Platelets 122   Recent Lipid Panel    Component Value Date/Time   CHOL 100 02/18/2021 1011   TRIG 78 02/18/2021 1011   HDL 50 02/18/2021 1011   CHOLHDL 2.0 02/18/2021 1011   CHOLHDL 4.2 07/09/2020 0757   VLDL 15 07/09/2020 0757   LDLCALC 34 02/18/2021 1011    Physical Exam:   VS:  BP 140/66 (BP Location: Left Arm, Patient Position: Sitting)   Pulse 74   Ht 5\' 9"  (1.753 m)   Wt 169 lb 9.6 oz (76.9 kg)   SpO2 97%   BMI 25.05 kg/m    Wt Readings from Last 3 Encounters:  02/26/21 169 lb 9.6 oz (76.9 kg)  11/20/20 175 lb 12.8 oz (79.7 kg)  07/07/20 160 lb (72.6 kg)    General: Well nourished, well developed, in no acute distress Head: Atraumatic, normal size  Eyes: PEERLA, EOMI  Neck: Supple, no JVD Endocrine: No thryomegaly Cardiac: Normal S1, S2; RRR; no murmurs, rubs, or gallops Lungs: Clear to auscultation bilaterally, no wheezing, rhonchi or  rales  Abd: Soft, nontender, no hepatomegaly  Ext: No edema, pulses 2+ Musculoskeletal: No deformities, BUE and BLE strength normal and equal Skin: Warm and dry, no rashes   Neuro: Alert and oriented to person, place, time, and situation, CNII-XII grossly intact, no focal deficits  Psych: Normal mood and affect   ASSESSMENT:   LENZY KERSCHNER is a 80 y.o. male who presents for the following: 1. Coronary artery disease involving native coronary artery of native heart without angina pectoris   2. Mixed hyperlipidemia   3. AAA (abdominal aortic aneurysm) without rupture ([place])   4. Primary hypertension     PLAN:   1. Coronary artery disease involving native coronary artery of native heart without angina pectoris 2. Mixed hyperlipidemia -Admitted to the hospital in August 2021 with unstable angina.  Found to have mild to moderate nonobstructive CAD.  He is completed 6 months of DAPT.  We will stop his Plavix.  He will continue aspirin 81 mg daily. -Main issue is elevated cholesterol.  He is on Repatha and Crestor.  Most recent LDL cholesterol is 34.  He will continue this. -No symptoms of angina.  Can walk up to 1.5 miles without any limitations.  3. AAA (abdominal aortic aneurysm) without rupture (HCC) -Known AAA of 3.6 cm in 2020.  Needs a repeat.  We will set him up for this.  4. Primary hypertension -Blood pressure well controlled today.  Continue chlorthalidone 25 mg daily, clonidine patch, hydralazine 25 mg 3 times daily, Imdur 60 mg daily, Aldactone 25 mg daily, telmisartan 80 mg daily   Disposition: Return in about 6 months (around 08/28/2021).  Medication Adjustments/Labs and Tests Ordered: Current medicines are reviewed at length with the patient today.  Concerns regarding medicines are outlined above.  Orders Placed This Encounter  Procedures  . VAS [place] AAA DUPLEX   Meds ordered this encounter  Medications  . chlorthalidone (HYGROTON) 25 MG tablet  Sig: Take 0.5 tablets  (12.5 mg total) by mouth daily.    Dispense:  90 tablet    Refill:  1    Disregard previous dose  . telmisartan (MICARDIS) 80 MG tablet    Sig: Take 1 tablet (80 mg total) by mouth daily.    Dispense:  90 tablet    Refill:  1  . rosuvastatin (CRESTOR) 5 MG tablet    Sig: Take 1 tablet (5 mg total) by mouth daily.    Dispense:  90 tablet    Refill:  1    Patient Instructions  Medication Instructions:  Stop Plavix   *If you need a refill on your cardiac medications before your next appointment, please call your pharmacy*   Follow-Up: At [facility], you and your health needs are our priority.  As part of our continuing mission to provide you with exceptional heart care, we have created designated Provider Care Teams.  These Care Teams include your primary Cardiologist (physician) and Advanced Practice Providers (APPs -  Physician Assistants and Nurse Practitioners) who all work together to provide you with the care you need, when you need it.  We recommend signing up for the patient portal called "MyChart".  Sign up information is provided on this After Visit Summary.  MyChart is used to connect with patients for Virtual Visits (Telemedicine).  Patients are able to view lab/test results, encounter notes, upcoming appointments, etc.  Non-urgent messages can be sent to your provider as well.   To learn more about what you can do with MyChart, go to [email or web address].    Your next appointment:   6 month(s)  The format for your next appointment:   In Person  Provider:   You may see Dr.O'Neal or one of the following Advanced Practice Providers on your designated Care Team:    Almyra Deforest, PA-C  Fabian Sharp, [place] or   Roby Lofts, PA-C         Time Spent with Patient: I have spent a total of 25 minutes with patient reviewing hospital notes, telemetry, EKGs, labs and examining the patient as well as establishing an assessment and plan that was discussed with the  patient.  > 50% of time was spent in direct patient care.  Signed, Addison Naegeli. Audie Box, MD, [place]  [address], [place], [place] [address] [phone]  02/26/2021 8:49 PM

## 2021-02-26 ENCOUNTER — Ambulatory Visit: Payer: Medicare Other | Admitting: Cardiovascular Disease

## 2021-02-26 ENCOUNTER — Encounter: Payer: Self-pay | Admitting: Cardiovascular Disease

## 2021-02-26 ENCOUNTER — Other Ambulatory Visit: Payer: Self-pay

## 2021-02-26 VITALS — BP 140/66 | HR 74 | Ht 69.0 in | Wt 169.6 lb

## 2021-02-26 DIAGNOSIS — I714 Abdominal aortic aneurysm, without rupture, unspecified: Secondary | ICD-10-CM

## 2021-02-26 DIAGNOSIS — I2510 Atherosclerotic heart disease of native coronary artery without angina pectoris: Secondary | ICD-10-CM | POA: Diagnosis not present

## 2021-02-26 DIAGNOSIS — E782 Mixed hyperlipidemia: Secondary | ICD-10-CM

## 2021-02-26 DIAGNOSIS — I10 Essential (primary) hypertension: Secondary | ICD-10-CM

## 2021-02-26 MED ORDER — TELMISARTAN 80 MG PO TABS
80.0000 mg | ORAL_TABLET | Freq: Every day | ORAL | 1 refills | Status: DC
Start: 2021-02-26 — End: 2021-09-16

## 2021-02-26 MED ORDER — CHLORTHALIDONE 25 MG PO TABS
12.5000 mg | ORAL_TABLET | Freq: Every day | ORAL | 1 refills | Status: DC
Start: 2021-02-26 — End: 2021-07-15

## 2021-02-26 MED ORDER — ROSUVASTATIN CALCIUM 5 MG PO TABS
5.0000 mg | ORAL_TABLET | Freq: Every day | ORAL | 1 refills | Status: DC
Start: 2021-02-26 — End: 2021-09-30

## 2021-02-26 NOTE — Patient Instructions (Signed)
Medication Instructions:  Stop Plavix   *If you need a refill on your cardiac medications before your next appointment, please call your pharmacy*   Follow-Up: At [facility], you and your health needs are our priority.  As part of our continuing mission to provide you with exceptional heart care, we have created designated Provider Care Teams.  These Care Teams include your primary Cardiologist (physician) and Advanced Practice Providers (APPs -  Physician Assistants and Nurse Practitioners) who all work together to provide you with the care you need, when you need it.  We recommend signing up for the patient portal called "MyChart".  Sign up information is provided on this After Visit Summary.  MyChart is used to connect with patients for Virtual Visits (Telemedicine).  Patients are able to view lab/test results, encounter notes, upcoming appointments, etc.  Non-urgent messages can be sent to your provider as well.   To learn more about what you can do with MyChart, go to [email or web address].    Your next appointment:   6 month(s)  The format for your next appointment:   In Person  Provider:   You may see Dr.O'Neal or one of the following Advanced Practice Providers on your designated Care Team:    Almyra Deforest, PA-C  Fabian Sharp, PA-C or   Roby Lofts, [place]

## 2021-03-01 ENCOUNTER — Other Ambulatory Visit: Payer: Self-pay

## 2021-03-01 ENCOUNTER — Ambulatory Visit (HOSPITAL_COMMUNITY)
Admission: RE | Admit: 2021-03-01 | Discharge: 2021-03-01 | Disposition: A | Discharge status: Home / Self Care | Payer: Medicare Other | Source: Ambulatory Visit | Attending: Cardiovascular Disease | Admitting: Cardiovascular Disease

## 2021-03-01 ENCOUNTER — Other Ambulatory Visit (HOSPITAL_COMMUNITY): Payer: Self-pay | Admitting: Cardiovascular Disease

## 2021-03-01 DIAGNOSIS — I714 Abdominal aortic aneurysm, without rupture, unspecified: Secondary | ICD-10-CM

## 2021-03-01 DIAGNOSIS — I739 Peripheral vascular disease, unspecified: Secondary | ICD-10-CM

## 2021-05-09 DIAGNOSIS — L255 Unspecified contact dermatitis due to plants, except food: Secondary | ICD-10-CM | POA: Diagnosis not present

## 2021-05-11 ENCOUNTER — Emergency Department (HOSPITAL_COMMUNITY): Payer: Medicare Other

## 2021-05-11 ENCOUNTER — Encounter (HOSPITAL_COMMUNITY): Payer: Self-pay | Admitting: Emergency Medicine

## 2021-05-11 ENCOUNTER — Observation Stay (HOSPITAL_COMMUNITY)
Admission: EM | Admit: 2021-05-11 | Discharge: 2021-05-12 | Disposition: A | Discharge status: Home / Self Care | Payer: Medicare Other | Attending: Family Medicine | Admitting: Family Medicine

## 2021-05-11 ENCOUNTER — Other Ambulatory Visit: Payer: Self-pay

## 2021-05-11 DIAGNOSIS — Z20822 Contact with and (suspected) exposure to covid-19: Secondary | ICD-10-CM | POA: Diagnosis not present

## 2021-05-11 DIAGNOSIS — Z7982 Long term (current) use of aspirin: Secondary | ICD-10-CM | POA: Insufficient documentation

## 2021-05-11 DIAGNOSIS — I161 Hypertensive emergency: Secondary | ICD-10-CM | POA: Diagnosis not present

## 2021-05-11 DIAGNOSIS — Z87891 Personal history of nicotine dependence: Secondary | ICD-10-CM | POA: Diagnosis not present

## 2021-05-11 DIAGNOSIS — Z79899 Other long term (current) drug therapy: Secondary | ICD-10-CM | POA: Diagnosis not present

## 2021-05-11 DIAGNOSIS — I10 Essential (primary) hypertension: Secondary | ICD-10-CM | POA: Diagnosis present

## 2021-05-11 DIAGNOSIS — Z8551 Personal history of malignant neoplasm of bladder: Secondary | ICD-10-CM | POA: Diagnosis not present

## 2021-05-11 DIAGNOSIS — R079 Chest pain, unspecified: Secondary | ICD-10-CM

## 2021-05-11 DIAGNOSIS — R001 Bradycardia, unspecified: Secondary | ICD-10-CM | POA: Diagnosis present

## 2021-05-11 DIAGNOSIS — I2510 Atherosclerotic heart disease of native coronary artery without angina pectoris: Secondary | ICD-10-CM | POA: Diagnosis not present

## 2021-05-11 DIAGNOSIS — R0602 Shortness of breath: Secondary | ICD-10-CM | POA: Diagnosis not present

## 2021-05-11 DIAGNOSIS — E785 Hyperlipidemia, unspecified: Secondary | ICD-10-CM | POA: Diagnosis present

## 2021-05-11 LAB — CBC
HCT: 37.5 % — ABNORMAL LOW (ref 39.0–52.0)
HCT: 31.6 % — ABNORMAL LOW (ref 39.0–52.0)
Hemoglobin: 12.1 g/dL — ABNORMAL LOW (ref 13.0–17.0)
Hemoglobin: 9.9 g/dL — ABNORMAL LOW (ref 13.0–17.0)
MCH: 28.8 pg (ref 26.0–34.0)
MCH: 29.3 pg (ref 26.0–34.0)
MCHC: 32.3 g/dL (ref 30.0–36.0)
MCHC: 31.3 g/dL (ref 30.0–36.0)
MCV: 89.3 fL (ref 80.0–100.0)
MCV: 93.5 fL (ref 80.0–100.0)
Platelets: 120 10*3/uL — ABNORMAL LOW (ref 150–400)
Platelets: 110 10*3/uL — ABNORMAL LOW (ref 150–400)
RBC: 4.2 MIL/uL — ABNORMAL LOW (ref 4.22–5.81)
RBC: 3.38 MIL/uL — ABNORMAL LOW (ref 4.22–5.81)
RDW: 12.3 % (ref 11.5–15.5)
RDW: 12.7 % (ref 11.5–15.5)
WBC: 12.5 10*3/uL — ABNORMAL HIGH (ref 4.0–10.5)
WBC: 9.3 10*3/uL (ref 4.0–10.5)
nRBC: 0 % (ref 0.0–0.2)
nRBC: 0 % (ref 0.0–0.2)

## 2021-05-11 LAB — BASIC METABOLIC PANEL
Anion gap: 9 (ref 5–15)
BUN: 36 mg/dL — ABNORMAL HIGH (ref 8–23)
CO2: 25 mmol/L (ref 22–32)
Calcium: 8.9 mg/dL (ref 8.9–10.3)
Chloride: 111 mmol/L (ref 98–111)
Creatinine, Ser: 1.06 mg/dL (ref 0.61–1.24)
GFR, Estimated: >60 mL/min (ref >60)
Glucose, Bld: 102 mg/dL — ABNORMAL HIGH (ref 70–99)
Potassium: 3.9 mmol/L (ref 3.5–5.1)
Sodium: 145 mmol/L (ref 135–145)

## 2021-05-11 LAB — CREATININE, SERUM
Creatinine, Ser: 0.8 mg/dL (ref 0.61–1.24)
GFR, Estimated: >60 mL/min (ref >60)

## 2021-05-11 LAB — TROPONIN I (HIGH SENSITIVITY)
Troponin I (High Sensitivity): 27 ng/L — ABNORMAL HIGH
Troponin I (High Sensitivity): 20 ng/L — ABNORMAL HIGH (ref <18)
Troponin I (High Sensitivity): 17 ng/L (ref <18)
Troponin I (High Sensitivity): 25 ng/L — ABNORMAL HIGH (ref <18)

## 2021-05-11 MED ORDER — ACETAMINOPHEN 325 MG PO TABS: 650.0000 mg | ORAL_TABLET | ORAL | Status: DC | PRN

## 2021-05-11 MED ORDER — HYDRALAZINE HCL 50 MG PO TABS
50.0000 mg | ORAL_TABLET | Freq: Once | ORAL | Status: AC
  Administered 2021-05-11: 50 mg via ORAL
  Filled 2021-05-11: qty 1

## 2021-05-11 MED ORDER — ISOSORBIDE MONONITRATE ER 60 MG PO TB24
60.0000 mg | ORAL_TABLET | Freq: Every day | ORAL | Status: DC
  Administered 2021-05-11: 60 mg via ORAL
  Filled 2021-05-11: qty 1

## 2021-05-11 MED ORDER — CHLORTHALIDONE 25 MG PO TABS
12.5000 mg | ORAL_TABLET | Freq: Every day | ORAL | Status: DC
  Administered 2021-05-11 – 2021-05-12 (×2): 12.5 mg via ORAL
  Filled 2021-05-11 (×2): qty 0.5

## 2021-05-11 MED ORDER — CLONIDINE HCL 0.2 MG/24HR TD PTWK: 0.2000 mg | MEDICATED_PATCH | TRANSDERMAL | Status: DC

## 2021-05-11 MED ORDER — ONDANSETRON HCL 4 MG/2ML IJ SOLN: 4.0000 mg | Freq: Four times a day (QID) | INTRAMUSCULAR | Status: DC | PRN

## 2021-05-11 MED ORDER — CHLORHEXIDINE GLUCONATE CLOTH 2 % EX PADS
6.0000 | MEDICATED_PAD | Freq: Every day | CUTANEOUS | Status: DC
  Administered 2021-05-11: 6 via TOPICAL

## 2021-05-11 MED ORDER — ENOXAPARIN SODIUM 40 MG/0.4ML IJ SOSY
40.0000 mg | PREFILLED_SYRINGE | INTRAMUSCULAR | Status: DC
  Administered 2021-05-11: 40 mg via SUBCUTANEOUS
  Filled 2021-05-11: qty 0.4

## 2021-05-11 MED ORDER — IRBESARTAN 300 MG PO TABS
300.0000 mg | ORAL_TABLET | Freq: Every day | ORAL | Status: DC
  Administered 2021-05-11: 300 mg via ORAL
  Filled 2021-05-11 (×2): qty 1

## 2021-05-11 MED ORDER — HYDRALAZINE HCL 25 MG PO TABS
25.0000 mg | ORAL_TABLET | Freq: Four times a day (QID) | ORAL | Status: DC | PRN
  Administered 2021-05-11 – 2021-05-12 (×2): 25 mg via ORAL
  Filled 2021-05-11: qty 1

## 2021-05-11 MED ORDER — EZETIMIBE 10 MG PO TABS
10.0000 mg | ORAL_TABLET | Freq: Every day | ORAL | Status: DC
  Administered 2021-05-11 – 2021-05-12 (×2): 10 mg via ORAL
  Filled 2021-05-11 (×2): qty 1

## 2021-05-11 MED ORDER — HYDRALAZINE HCL 25 MG PO TABS
25.0000 mg | ORAL_TABLET | Freq: Three times a day (TID) | ORAL | Status: DC
  Administered 2021-05-11: 25 mg via ORAL
  Filled 2021-05-11 (×2): qty 1

## 2021-05-11 MED ORDER — ROSUVASTATIN CALCIUM 5 MG PO TABS
5.0000 mg | ORAL_TABLET | Freq: Every day | ORAL | Status: DC
  Administered 2021-05-11 – 2021-05-12 (×2): 5 mg via ORAL
  Filled 2021-05-11 (×2): qty 1

## 2021-05-11 MED ORDER — SPIRONOLACTONE 25 MG PO TABS
25.0000 mg | ORAL_TABLET | Freq: Every day | ORAL | Status: DC
  Administered 2021-05-11 – 2021-05-12 (×2): 25 mg via ORAL
  Filled 2021-05-11 (×2): qty 1

## 2021-05-11 MED ORDER — ASPIRIN EC 81 MG PO TBEC
81.0000 mg | DELAYED_RELEASE_TABLET | Freq: Every day | ORAL | Status: DC
  Administered 2021-05-11 – 2021-05-12 (×2): 81 mg via ORAL
  Filled 2021-05-11 (×2): qty 1

## 2021-05-11 NOTE — ED Provider Notes (Signed)
[facility] DEPT Provider Note   CSN: [id number] Arrival date & time: 05/11/21  1310     History Chief Complaint  Patient presents with   Chest Pain    Ricardo [name] is a 80 y.o. male.  80 year old male who presents with chest pain that began today at home.  Describes that is similar to his prior cardiac event.  Does have a history of coronary artery stents.  Took 3 nitroglycerin which did not seem to help his symptoms but his symptoms have gradually improved.  Pain did not radiate to his neck.  No recent URI symptoms.  Denies any exertional component to it.      Past Medical History:  Diagnosis Date   Abdominal aneurysm ([place]) 08/30/2018   Arthritis    SHOULDERS, NECK , RIGHT HIP   Bladder cancer ([place]) DX   OCT 2011--  FOLLOWED BY DR WOODRUFF   S/P TURBT,  HIGH GRADE BLADDER CANCER   CAD (coronary artery disease), native coronary artery 08/30/2018   normal Left main, 30% stenosis ostial LAD, 50% stenosis OM 3, 80% stenosis mid RCA;  2.5 x 8 mm Tetra Stent Dr. 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   CAD (coronary artery disease), native coronary artery 08/30/2018   Hyperlipidemia 08/30/2018   Essential hypertension 08/30/2018   Abdominal aneurysm ([place]) 08/30/2018   Personal history of malignant neoplasm of bladder 08/30/2018    Past Surgical History:  Procedure Laterality Date   CORONARY ANGIOPLASTY  1996   CORONARY  ANGIOPLASTY WITH STENT PLACEMENT  2001   STENT TO RCA   CYSTO/ BLADDER BX  10-22-10   CYSTOSCOPY W/ RETROGRADES  11/12/2011   Procedure: CYSTOSCOPY WITH RETROGRADE PYELOGRAM;  Surgeon: Molli Hazard, MD;  Location: [facility]  Service: Urology;  Laterality: Bilateral;   LEFT HEART CATH AND CORONARY ANGIOGRAPHY N/A 07/09/2020   Procedure: LEFT HEART CATH AND CORONARY ANGIOGRAPHY;  Surgeon: Dixie Dials, MD;  Location: [facility];  Service: Cardiovascular;  Laterality: N/A;   LUMBAR FUSION     TRANSURETHRAL RESECTION OF BLADDER TUMOR  08-27-10   RIGHT ANTERIOR DOME   TRANSURETHRAL RESECTION OF BLADDER TUMOR  11/12/2011   Procedure: TRANSURETHRAL RESECTION OF BLADDER TUMOR (TURBT);  Surgeon: Molli Hazard, MD;  Location: [facility];  Service: Urology;  Laterality: N/A;  with PK Gyrus c-arm gyrus  digital ureteroscope       Family History  Problem Relation Age of Onset   Diabetes Mother    Hypertension Mother     Social History   Tobacco Use   Smoking status: Former    Packs/day: 1.50    Years: 5.00    Pack years: 7.50    Types: Cigarettes    Quit date: 11/05/1965    Years since quitting: 55.5   Smokeless tobacco: Never  Substance Use Topics  Alcohol use: No   Drug use: No    Home Medications Prior to Admission medications   Medication Sig Start Date End Date Taking? Authorizing Provider  aspirin 81 MG tablet Take 1 tablet (81 mg total) by mouth daily. 11/16/11   Rolan Bucco, MD  chlorthalidone (HYGROTON) 25 MG tablet Take 0.5 tablets (12.5 mg total) by mouth daily. 02/26/21   O'Neal, Cassie Freer, MD  cloNIDine (CATAPRES - DOSED IN MG/24 HR) 0.2 mg/24hr patch Place 0.2 mg onto the skin once a week.    [provider]  Coenzyme Q10 (CO Q-10) 200 MG CAPS Take 200 mg by mouth daily. 10/29/18   Revankar, Reita Cliche, MD  ezetimibe (ZETIA) 10 MG tablet Take 1 tablet (10 mg total) by mouth daily. 11/20/20 02/18/21   O'NealCassie Freer, MD  hydrALAZINE (APRESOLINE) 25 MG tablet Take 1 tablet (25 mg total) by mouth every 8 (eight) hours. 07/10/20   Eugenie Filler, MD  isosorbide mononitrate (IMDUR) 60 MG 24 hr tablet Take 1 tablet (60 mg total) by mouth daily. 07/11/20   Eugenie Filler, MD  nitroGLYCERIN (NITROSTAT) 0.4 MG SL tablet Place 1 tablet (0.4 mg total) under the tongue every 5 (five) minutes as needed. 09/30/18 07/08/21  Revankar, Reita Cliche, MD  REPATHA SURECLICK 237 MG/ML SOAJ Inject 1 mL into the skin every 30 (thirty) days. 06/11/20   [provider]  rosuvastatin (CRESTOR) 5 MG tablet Take 1 tablet (5 mg total) by mouth daily. 02/26/21   O'NealCassie Freer, MD  spironolactone (ALDACTONE) 25 MG tablet Take 1 tablet (25 mg total) by mouth daily. 11/20/20 02/18/21  Geralynn Rile, MD  telmisartan (MICARDIS) 80 MG tablet Take 1 tablet (80 mg total) by mouth daily. 02/26/21   O'Neal, Cassie Freer, MD    Allergies    Norvasc [amlodipine besylate]  Review of Systems   Review of Systems  All other systems reviewed and are negative.  Physical Exam Updated Vital Signs BP (!) 181/85 (BP Location: Left Arm)   Pulse (!) 54   Temp 97.7 F (36.5 C) (Oral)   Resp 16   SpO2 98%   Physical Exam Vitals and nursing note reviewed.  Constitutional:      General: He is not in acute distress.    Appearance: Normal appearance. He is well-developed. He is not toxic-appearing.  HENT:     Head: Normocephalic and atraumatic.  Eyes:     General: Lids are normal.     Conjunctiva/sclera: Conjunctivae normal.     Pupils: Pupils are equal, round, and reactive to light.  Neck:     Thyroid: No thyroid mass.     Trachea: No tracheal deviation.  Cardiovascular:     Rate and Rhythm: Normal rate and regular rhythm.     Heart sounds: Normal heart sounds. No murmur heard.   No gallop.  Pulmonary:     Effort: Pulmonary effort is normal. No respiratory distress.     Breath sounds: Normal breath  sounds. No stridor. No decreased breath sounds, wheezing, rhonchi or rales.  Abdominal:     General: There is no distension.     Palpations: Abdomen is soft.     Tenderness: There is no abdominal tenderness. There is no rebound.  Musculoskeletal:        General: No tenderness. Normal range of motion.     Cervical back: Normal range of motion and neck supple.  Skin:    General: Skin is warm and dry.  Findings: No abrasion or rash.  Neurological:     Mental Status: He is alert and oriented to person, place, and time. Mental status is at baseline.     GCS: GCS eye subscore is 4. GCS verbal subscore is 5. GCS motor subscore is 6.     Cranial Nerves: Cranial nerves are intact. No cranial nerve deficit.     Sensory: No sensory deficit.     Motor: Motor function is intact.  Psychiatric:        Attention and Perception: Attention normal.        Speech: Speech normal.        Behavior: Behavior normal.    ED Results / Procedures / Treatments   Labs (all labs ordered are listed, but only abnormal results are displayed) Labs Reviewed  BASIC METABOLIC PANEL  CBC  TROPONIN I (HIGH SENSITIVITY)    EKG EKG Interpretation  Date/Time:  Saturday May 11 2021 13:20:22 EDT Ventricular Rate:  55 PR Interval:  152 QRS Duration: 97 QT Interval:  455 QTC Calculation: 436 R Axis:   -1 Text Interpretation: Sinus rhythm Abnormal R-wave progression, early transition Confirmed by Lacretia Leigh ([id number]) on 05/11/2021 1:38:31 PM  Radiology No results found.  Procedures Procedures   Medications Ordered in ED Medications - No data to display  ED Course  I have reviewed the triage vital signs and the nursing notes.  Pertinent labs & imaging results that were available during my care of the patient were reviewed by me and considered in my medical decision making (see chart for details).    MDM Rules/Calculators/A&P                          Patient currently pain-free at this time.  For  troponin was noted and is elevated.  Discussed with Dr. Quentin Ore from cardiology who recommends patient troponins to be trended.  He does not recommend patient to be transferred to [facility] at this time.  Will consult hospitalist for admission Final Clinical Impression(s) / ED Diagnoses Final diagnoses:  None    Rx / DC Orders ED Discharge Orders     None        Lacretia Leigh, MD 05/11/21 1519

## 2021-05-11 NOTE — Progress Notes (Signed)
Pt came to the floor with a HR 50's and SBP 230's. MD was notified and pharmacy called to verify anti-hypertensive medications. There were multiple delays with pharmacy getting medications verified and care was delayed. MD was provided again to be made aware of delays.

## 2021-05-11 NOTE — ED Notes (Signed)
Pt heart rate in the 40's sent md aware

## 2021-05-11 NOTE — ED Triage Notes (Signed)
Reports chest pain since 1240 today, has taken 3 nitroglycerin tablets w/ no relief. States pain is in the center of chest and feels like pressure. Also states pain was initially radiating up the neck but has since resolved. Hx of HTN and CAD.

## 2021-05-11 NOTE — H&P (Addendum)
History and Physical    Ricardo [name] RDE:[id number] DOB: 02/16/1941 DOA: 05/11/2021  PCP: Ricardo Dus, MD  Patient coming from: Home  I have personally briefly reviewed patient's old medical records in [place]  Chief Complaint: Chest pain  HPI: Ricardo [name] is a 80 y.o. male with medical history significant of CAD, hypertension and multiple others presents to the ED with complaint of chest pain.:  Patient, pain started around 12:40 PM today.  Pain was initially 6 and then became 10 out of 10.  He did 3 nitroglycerin back-to-back which did not help much.  Pain was pressure-like, radiating to the neck with no aggravating or relieving factor.  He denies having had any nausea, dizziness, diaphoresis, shortness of breath with it.  Soon after arrival to ED, his pain subsided.  Patient's wife is at the bedside  ED Course: Upon arrival to ED, he was bradycardic and hypertensive.  His heart rate dipped to 42.  Troponin slightly elevated.  CBC showed mild leukocytosis and thrombocytopenia.  BMP within normal range.  I do not see that he had received any medications in the ED.  ED physician discussed case with Dr. Quentin [name] of cardiology who recommended admission at [facility] and they will consult.  Review of Systems: As per HPI otherwise negative.    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 Doylene [name]   Coronary artery disease CARDIOLOGIST- DR Ricardo [name]-  LAST VISIT 2 WKS AGO-- WILL REQUEST NOTE AND EKG   STRESS TEST -- JULY 2009   Essential hypertension 08/30/2018   Hyperlipidemia    Hypertension    Nocturia    Personal history  of malignant neoplasm of bladder 08/30/2018   Post PTCA 2001-   X1 STENT TO RCA   AND 1996    Past Surgical History:  Procedure Laterality Date   CORONARY ANGIOPLASTY  1996   CORONARY ANGIOPLASTY WITH STENT PLACEMENT  2001   STENT TO RCA   CYSTO/ BLADDER BX  10-22-10   CYSTOSCOPY W/ RETROGRADES  11/12/2011   Procedure: CYSTOSCOPY WITH RETROGRADE PYELOGRAM;  Surgeon: Ricardo Hazard, MD;  Location: [facility];  Service: Urology;  Laterality: Bilateral;   LEFT HEART CATH AND CORONARY ANGIOGRAPHY N/A 07/09/2020   Procedure: LEFT HEART CATH AND CORONARY ANGIOGRAPHY;  Surgeon: Ricardo Dials, MD;  Location: [facility];  Service: Cardiovascular;  Laterality: N/A;   LUMBAR FUSION     TRANSURETHRAL RESECTION OF BLADDER TUMOR  08-27-10   RIGHT ANTERIOR DOME   TRANSURETHRAL RESECTION OF BLADDER TUMOR  11/12/2011   Procedure: TRANSURETHRAL RESECTION OF BLADDER TUMOR (TURBT);  Surgeon: Ricardo Hazard, MD;  Location: [facility];  Service: Urology;  Laterality: N/A;  with PK Gyrus c-arm gyrus  digital ureteroscope     reports that he quit smoking about 55 years ago. His smoking use included cigarettes. He has a 7.50 pack-year smoking history. He has never used smokeless tobacco. He reports that he does not drink alcohol and does not use drugs.  Allergies  Allergen Reactions  Norvasc [Amlodipine Besylate] Other (See Comments)    CAUSES TEETH TO FALL OUT    Family History  Problem Relation Age of Onset   Diabetes Mother    Hypertension Mother     Prior to Admission medications   Medication Sig Start Date End Date Taking? Authorizing Provider  aspirin 81 MG tablet Take 1 tablet (81 mg total) by mouth daily. 11/16/11   Rolan Bucco, MD  chlorthalidone (HYGROTON) 25 MG tablet Take 0.5 tablets (12.5 mg total) by mouth daily. 02/26/21   Ricardo [name], Ricardo Freer, MD  cloNIDine (CATAPRES - DOSED IN MG/24 HR) 0.2 mg/24hr patch Place 0.2 mg onto  the skin once a week.    [provider]  Coenzyme Q10 (CO Q-10) 200 MG CAPS Take 200 mg by mouth daily. 10/29/18   Ricardo, Reita Cliche, MD  ezetimibe (ZETIA) 10 MG tablet Take 1 tablet (10 mg total) by mouth daily. 11/20/20 02/18/21  O'NealCassie Freer, MD  hydrALAZINE (APRESOLINE) 25 MG tablet Take 1 tablet (25 mg total) by mouth every 8 (eight) hours. 07/10/20   Eugenie Filler, MD  isosorbide mononitrate (IMDUR) 60 MG 24 hr tablet Take 1 tablet (60 mg total) by mouth daily. 07/11/20   Eugenie Filler, MD  nitroGLYCERIN (NITROSTAT) 0.4 MG SL tablet Place 1 tablet (0.4 mg total) under the tongue every 5 (five) minutes as needed. 09/30/18 07/08/21  Ricardo, Reita Cliche, MD  REPATHA SURECLICK 160 MG/ML SOAJ Inject 1 mL into the skin every 30 (thirty) days. 06/11/20   [provider]  rosuvastatin (CRESTOR) 5 MG tablet Take 1 tablet (5 mg total) by mouth daily. 02/26/21   O'NealCassie Freer, MD  spironolactone (ALDACTONE) 25 MG tablet Take 1 tablet (25 mg total) by mouth daily. 11/20/20 02/18/21  Ricardo Rile, MD  telmisartan (MICARDIS) 80 MG tablet Take 1 tablet (80 mg total) by mouth daily. 02/26/21   Ricardo Rile, MD    Physical Exam: Vitals:   05/11/21 1415 05/11/21 1430 05/11/21 1515 05/11/21 1518  BP: (!) 173/73 (!) 171/66 (!) 172/74 (!) 171/75  Pulse: (!) 42   (!) 42  Resp: 13   16  Temp:      TempSrc:      SpO2: 98%   99%    Constitutional: NAD, calm, comfortable Vitals:   05/11/21 1415 05/11/21 1430 05/11/21 1515 05/11/21 1518  BP: (!) 173/73 (!) 171/66 (!) 172/74 (!) 171/75  Pulse: (!) 42   (!) 42  Resp: 13   16  Temp:      TempSrc:      SpO2: 98%   99%   Eyes: PERRL, lids and conjunctivae normal ENMT: Mucous membranes are moist. Posterior pharynx clear of any exudate or lesions.Normal dentition.  Neck: normal, supple, no masses, no thyromegaly Respiratory: clear to auscultation bilaterally, no wheezing, no crackles. Normal respiratory effort.  No accessory muscle use.  Cardiovascular: Sinus bradycardia, no murmurs / rubs / gallops. No extremity edema. 2+ pedal pulses. No carotid bruits.  Abdomen: no tenderness, no masses palpated. No hepatosplenomegaly. Bowel sounds positive.  Musculoskeletal: no clubbing / cyanosis. No joint deformity upper and lower extremities. Good ROM, no contractures. Normal muscle tone.  Skin: no rashes, lesions, ulcers. No induration Neurologic: CN 2-12 grossly intact. Sensation intact, DTR normal. Strength 5/5 in all 4.  Psychiatric: Normal judgment and insight. Alert and oriented x 3. Normal mood.    Labs on Admission: I have personally reviewed following labs and imaging studies  CBC:  Recent Labs  Lab 05/11/21 1317  WBC 12.5*  HGB 12.1*  HCT 37.5*  MCV 89.3  PLT 151*   Basic Metabolic Panel: Recent Labs  Lab 05/11/21 1317  NA 145  K 3.9  CL 111  CO2 25  GLUCOSE 102*  BUN 36*  CREATININE 1.06  CALCIUM 8.9   GFR: CrCl cannot be calculated (Unknown ideal weight.). Liver Function Tests: No results for input(s): AST, ALT, ALKPHOS, BILITOT, PROT, ALBUMIN in the last 168 hours. No results for input(s): LIPASE, AMYLASE in the last 168 hours. No results for input(s): AMMONIA in the last 168 hours. Coagulation Profile: No results for input(s): INR, PROTIME in the last 168 hours. Cardiac Enzymes: No results for input(s): CKTOTAL, CKMB, CKMBINDEX, TROPONINI in the last 168 hours. BNP (last 3 results) No results for input(s): PROBNP in the last 8760 hours. HbA1C: No results for input(s): HGBA1C in the last 72 hours. CBG: No results for input(s): GLUCAP in the last 168 hours. Lipid Profile: No results for input(s): CHOL, HDL, LDLCALC, TRIG, CHOLHDL, LDLDIRECT in the last 72 hours. Thyroid Function Tests: No results for input(s): TSH, T4TOTAL, FREET4, T3FREE, THYROIDAB in the last 72 hours. Anemia Panel: No results for input(s): VITAMINB12, FOLATE, FERRITIN, TIBC, IRON, RETICCTPCT in the  last 72 hours. Urine analysis:    Component Value Date/Time   COLORURINE YELLOW 07/08/2020 [place] 07/08/2020 1139   LABSPEC 1.012 07/08/2020 1139   PHURINE 7.0 07/08/2020 1139   GLUCOSEU NEGATIVE 07/08/2020 1139   HGBUR NEGATIVE 07/08/2020 1139   BILIRUBINUR NEGATIVE 07/08/2020 1139   KETONESUR NEGATIVE 07/08/2020 1139   PROTEINUR NEGATIVE 07/08/2020 1139   NITRITE NEGATIVE 07/08/2020 1139   LEUKOCYTESUR NEGATIVE 07/08/2020 1139    Radiological Exams on Admission: DG Chest 2 View  Result Date: 05/11/2021 CLINICAL DATA:  Shortness of breath EXAM: CHEST - 2 VIEW COMPARISON:  07/07/2020 FINDINGS: The heart size and mediastinal contours are within normal limits. Both lungs are clear. Disc degenerative disease of the thoracic spine. IMPRESSION: No acute abnormality of the lungs. Electronically Signed   By: Eddie Candle M.D.   On: 05/11/2021 14:51    EKG: Independently reviewed.  Sinus bradycardia with no acute ST-T wave changes.  Assessment/Plan Active Problems:   CAD (coronary artery disease), native coronary artery   Hyperlipidemia   Essential hypertension   Chest pain at rest   Sinus bradycardia   History of CAD now with chest pain with sinus bradycardia: Troponin slightly elevated, will follow serial cardiac exams.  Transthoracic echo.  Monitor on telemetry and stepdown.  Currently chest pain-free.  Cardiology consulted.  I personally notified Dr. Quentin [name] about patient's significant sinus bradycardia with rates as low as 42, he mentioned that he was aware and cardiology will see patient in the morning. resume aspirin.  Essential hypertension: Blood pressure significantly elevated.  He is on multiple antihypertensive medications, I will resume all of them and place him on as needed hydralazine.  Hyperlipidemia: Resume Crestor.  Leukocytosis: Nonspecific.  No signs of infection.  Repeat labs in the morning.  DVT prophylaxis: enoxaparin (LOVENOX) injection 40 mg  Start: 05/11/21 1545 Code Status: Full code Family Communication: None present at bedside.  Plan of care discussed with patient in length and he verbalized understanding and agreed with it. Disposition Plan: Potential discharge tomorrow if medically stable Consults called: Cardiology Admission status: Observation   Status is: Observation  The patient remains OBS appropriate and will d/c before 2 midnights.  Dispo: The patient  is from: Home              Anticipated d/c is to: Home              Patient currently is not medically stable to d/c.   Difficult to place patient No       Darliss Cheney MD [facility]  05/11/2021, 3:58 PM  To contact the attending provider between 7A-7P or the covering provider during after hours 7P-7A, please log into the web site [email or web address]

## 2021-05-12 ENCOUNTER — Observation Stay (HOSPITAL_BASED_OUTPATIENT_CLINIC_OR_DEPARTMENT_OTHER): Payer: Medicare Other

## 2021-05-12 DIAGNOSIS — I10 Essential (primary) hypertension: Secondary | ICD-10-CM

## 2021-05-12 DIAGNOSIS — I161 Hypertensive emergency: Secondary | ICD-10-CM

## 2021-05-12 DIAGNOSIS — I2510 Atherosclerotic heart disease of native coronary artery without angina pectoris: Secondary | ICD-10-CM

## 2021-05-12 DIAGNOSIS — R001 Bradycardia, unspecified: Secondary | ICD-10-CM | POA: Diagnosis not present

## 2021-05-12 DIAGNOSIS — R079 Chest pain, unspecified: Secondary | ICD-10-CM

## 2021-05-12 LAB — ECHOCARDIOGRAM COMPLETE
Area-P 1/2: 2.73 cm2
S' Lateral: 2.6 cm

## 2021-05-12 LAB — MRSA PCR SCREENING: MRSA by PCR: NEGATIVE

## 2021-05-12 LAB — SARS CORONAVIRUS 2 (TAT 6-24 HRS): SARS Coronavirus 2: NEGATIVE

## 2021-05-12 MED ORDER — HYDRALAZINE HCL 50 MG PO TABS: 50.0000 mg | ORAL_TABLET | Freq: Three times a day (TID) | ORAL | 0 refills | Status: DC

## 2021-05-12 MED ORDER — HYDRALAZINE HCL 50 MG PO TABS: 50.0000 mg | ORAL_TABLET | Freq: Three times a day (TID) | ORAL | Status: DC

## 2021-05-12 NOTE — Consult Note (Signed)
Cardiology Consultation:   Patient ID: Ricardo [name] MRN: [id number]; DOB: [birth year]  Admit date: 05/11/2021 Date of Consult: 05/12/2021  PCP:  Ricardo Dus, MD   [facility] Providers Cardiologist:  Ricardo [name] Patient Profile:   Ricardo [name] is a 80 y.o. male with a hx of CAD and HTN  who is being seen 05/12/2021 for the evaluation of CP, HTN and bradycardia  at the request of Ricardo Ricardo [name] .  History of Present Illness:   Ricardo [name] is an 80 yo with hx of CAD (s/p PTCA 1996, PTCA/stent 2001 to RCA), HTN, HL, AAA (3.8 cm, 2022)  Followed by Ricardo [name] in cardiology, last seen in Jan 2022 The pt has a hx of uncontrolled HTN   When seen in clinic in Jan was 170   Avg BP on 24 hour log was 159.  Also noted to be bradycardia on monitor   Some dizziness with standing   Ques TIA like spell in past.    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 Doylene [name]   Coronary artery disease CARDIOLOGIST- Ricardo Ricardo [name]-  LAST VISIT 2 WKS AGO-- WILL REQUEST NOTE AND EKG   STRESS TEST -- JULY 2009   Essential hypertension 08/30/2018   Hyperlipidemia    Hypertension    Nocturia     Personal history of malignant neoplasm of bladder 08/30/2018   Post PTCA 2001-   X1 STENT TO RCA   AND 1996    Past Surgical History:  Procedure Laterality Date   CORONARY ANGIOPLASTY  1996   CORONARY ANGIOPLASTY WITH STENT PLACEMENT  2001   STENT TO RCA   CYSTO/ BLADDER BX  10-22-10   CYSTOSCOPY W/ RETROGRADES  11/12/2011   Procedure: CYSTOSCOPY WITH RETROGRADE PYELOGRAM;  Surgeon: Ricardo Hazard, MD;  Location: [facility];  Service: Urology;  Laterality: Bilateral;   LEFT HEART CATH AND CORONARY ANGIOGRAPHY N/A 07/09/2020   Procedure: LEFT HEART CATH AND CORONARY ANGIOGRAPHY;  Surgeon: Ricardo Dials, MD;  Location: [place] [facility];  Service: Cardiovascular;  Laterality: N/A;   LUMBAR FUSION     TRANSURETHRAL RESECTION OF BLADDER TUMOR  08-27-10   RIGHT ANTERIOR DOME   TRANSURETHRAL RESECTION OF BLADDER TUMOR  11/12/2011   Procedure: TRANSURETHRAL RESECTION OF BLADDER TUMOR (TURBT);  Surgeon: Ricardo Hazard, MD;  Location: [facility];  Service: Urology;  Laterality: N/A;  with  PK Gyrus c-arm gyrus  digital ureteroscope       Inpatient Medications: Scheduled Meds:  aspirin EC  81 mg Oral Daily   Chlorhexidine Gluconate Cloth  6 each Topical Daily   chlorthalidone  12.5 mg Oral Daily   [START ON 05/13/2021] cloNIDine  0.2 mg Transdermal Weekly   enoxaparin (LOVENOX) injection  40 mg Subcutaneous Q24H   ezetimibe  10 mg Oral Daily   hydrALAZINE  25 mg Oral Q8H   irbesartan  300 mg Oral Daily   isosorbide mononitrate  60 mg Oral Daily   rosuvastatin  5 mg Oral Daily   spironolactone  25 mg Oral Daily   Continuous Infusions:  PRN Meds: acetaminophen, hydrALAZINE, ondansetron (ZOFRAN) IV  Allergies:    Allergies  Allergen Reactions   Clonidine Hcl Other (See Comments)    Reaction to 0.2 mg tabs - caused dry mouth, drowsiness, lightheadedness - reported 07/08/2020 (no reaction to patch)   Cyclobenzaprine Other (See  Comments)    Numbness in lips   Norvasc [Amlodipine Besylate] Other (See Comments)    CAUSES TEETH TO FALL OUT    Social History:   Social History   Socioeconomic History   Marital status: Married    Spouse name: Not on file   Number of children: 4   Years of education: Not on file   Highest education level: Not on file  Occupational History   Occupation: retired - Probation officer  Tobacco Use   Smoking status: Former    Packs/day: 1.50    Years: 5.00    Pack years: 7.50    Types: Cigarettes    Quit date: 11/05/1965    Years since quitting: 55.5   Smokeless tobacco: Never  Substance and Sexual Activity   Alcohol use: No   Drug use: No   Sexual activity: Not on file  Other Topics Concern   Not on file  Social History Narrative   Not on file   Social Determinants of Health   Financial Resource Strain: Not on file  Food Insecurity: Not on file  Transportation Needs: Not on file  Physical Activity: Not on file  Stress: Not on file  Social Connections: Not on file  Intimate Partner Violence: Not on file    Family History:    Family History  Problem Relation Age of Onset   Diabetes Mother    Hypertension Mother      ROS:  Please see the history of present illness.   All other ROS reviewed and negative.     Physical Exam/Data:   Vitals:   05/12/21 0400 05/12/21 0445 05/12/21 0500 05/12/21 0600  BP: (!) 163/54 (!) 160/73 (!) 148/48 (!) 119/40  Pulse: (!) 52  (!) 58 (!) 56  Resp: 14  20 17   Temp:      TempSrc:      SpO2: 97%  96% 95%    Intake/Output Summary (Last 24 hours) at 05/12/2021 0725 Last data filed at 05/12/2021 0400 Gross per 24 hour  Intake --  Output 1850 ml  Net -1850 ml   Last 3 Weights 02/26/2021 11/20/2020 07/07/2020  Weight (lbs) 169 lb 9.6 oz 175 lb 12.8 oz 160 lb  Weight (kg) 76.93 kg 79.742 kg 72.576 kg     There is no height or weight on file to calculate BMI.  General:  Well nourished, well developed, in no acute distress  Eating   HEENT: normal Lymph: no adenopathy Neck: no JVD Endocrine:  No thryomegaly Vascular:  No carotid bruits; FA pulses 2+ bilaterally without bruits  Cardiac:  normal S1, S2; RRR; no murmur  Lungs:  clear to auscultation bilaterally, no wheezing, rhonchi or rales  Abd: soft, nontender, no hepatomegaly  Ext: no edema Musculoskeletal:  No deformities, BUE and BLE strength normal and equal Skin: warm and dry  Neuro:  CNs 2-12 intact, no focal abnormalities noted Psych:  Normal affect   EKG:  The EKG was personally reviewed and demonstrates:   On 6/25 SB 55 BPM   Q wave in III Telemetry:  Telemetry was personally reviewed and demonstrates:    Relevant CV Studies:  Carotid USN  11/2020  Mild plaquing (4-76%) LICA, RICA   [place] AAA 03/4649 Summary: Abdominal Aorta: There is evidence of abnormal dilatation of the distal Abdominal aorta. There is evidence of abnormal dilation of the Right Common Iliac artery and Left External Iliac artery. The largest aortic diameter remains essentially unchanged compared to prior exam, today measures 3.8cm. Previous diameter measurement was 3.6 cm obtained on 09/15/2019.   TTE 07/08/2020  1. Left ventricular ejection fraction, by estimation, is 55 to 60%. The  left ventricle has normal function. The left ventricle has no regional  wall motion abnormalities. Left ventricular diastolic parameters are  consistent with Grade I diastolic  dysfunction (impaired relaxation).   2. Right ventricular systolic function is normal. The right ventricular  size is normal.   3. Left atrial size was mildly dilated.   4. The mitral valve is degenerative. Mild mitral valve regurgitation.   5. The aortic valve is tricuspid. Aortic valve regurgitation is trivial.  Mild aortic valve sclerosis is present, with no evidence of aortic valve  stenosis.   6. There is mild (Grade II) atheroma plaque involving the ascending  aorta.   7. The inferior vena cava is normal in size with  greater than 50%  respiratory variability, suggesting right atrial pressure of 3 mmHg.   LHC 07/09/2020   Ost LAD to Prox LAD lesion is 50% stenosed. Mid LAD to Dist LAD lesion is 50% stenosed. Ost RCA lesion is 40% stenosed. Prox RCA lesion is 40% stenosed. Mid RCA lesion is 40% stenosed. 2nd Diag lesion is 50% stenosed. 2nd Mrg lesion is 60% stenosed. Mid Cx lesion is 30% stenosed.   Mild to moderate multivessel disease. Add isosorbide and ASA and Plavix for 6 months and life style modification.    Laboratory Data:  High Sensitivity Troponin:   Recent Labs  Lab 05/11/21 1317 05/11/21 1517 05/11/21 1600 05/11/21 1800  TROPONINIHS 27* 20* 17 25*     Chemistry Recent Labs  Lab 05/11/21 1317 05/11/21 1600  NA 145  --   K 3.9  --   CL 111  --   CO2 25  --   GLUCOSE 102*  --   BUN 36*  --   CREATININE 1.06 0.80  CALCIUM 8.9  --   GFRNONAA >60 >60  ANIONGAP 9  --     No results for input(s): PROT, ALBUMIN, AST, ALT, ALKPHOS, BILITOT in the last 168 hours. Hematology Recent Labs  Lab 05/11/21 1317 05/11/21 1600  WBC 12.5* 9.3  RBC 4.20* 3.38*  HGB 12.1* 9.9*  HCT 37.5* 31.6*  MCV 89.3 93.5  MCH 28.8 29.3  MCHC 32.3 31.3  RDW 12.3 12.7  PLT 120* 110*   BNPNo results for input(s): BNP, PROBNP in the last 168 hours.  DDimer No results for input(s): DDIMER in the last 168 hours.   Radiology/Studies:  DG Chest 2 View  Result Date: 05/11/2021 CLINICAL DATA:  Shortness of breath EXAM: CHEST - 2 VIEW COMPARISON:  07/07/2020 FINDINGS: The heart size and mediastinal contours are within normal limits. Both lungs are clear. Disc degenerative disease of the thoracic spine. IMPRESSION: No acute abnormality of the lungs. Electronically Signed   By: Eddie Candle M.D.   On: 05/11/2021 14:51     Assessment and Plan:    HTN  Pt with significant HTN  Has not been well controlled  This could exacerbate angina    May have some reflex bradycardia when BP severely high BP  is better now    I would keep on Catapress patch, chlorithalidone, spironolactone, ARB, imdur.   Increased hydralazine to 50 tid   (not BID as he had been doing)  Will make sure he has f/u in clinic soon    Told him he needs to keep BP log     2  Bradycardia    IMproved   Not a cause of hemodynamic problems    3  CAD   s/p NSTEM in 06/2020 Cath as noted above Now had trivial elev of troponin which is not unexpected given degree of BP elevation    I would plan to increase medical Rx   Would not pursue further ischemic eval now unless CP recurs when BP is controlled Keep on  ASA   4  HL   Not optimally controlled   Stress importance of continued Rx  Continue statin, ezetimide and Repatha     :[id number]}  Will make sure he has an appt in clinic soon.    For questions or updates, please contact [place] Please consult [email or web address] for contact info under    Signed, Dorris Carnes, MD  05/12/2021 7:25 AM

## 2021-05-12 NOTE — Discharge Summary (Addendum)
Physician Discharge Summary  Ricardo [name]:[id number] DOB: Sep 18, 1941 DOA: 05/11/2021  PCP: Maury Dus, MD  Admit date: 05/11/2021 Discharge date: 05/12/2021    Admitted From: Home Disposition:   Home  R Homeecommendations for Outpatient Follow-up:  Follow up with PCP in 1-2 weeks Follow-up with cardiology further recommendations and scheduled appointment Please obtain BMP/CBC in one week Please follow up with your PCP on the following pending results: Unresulted Labs (From admission, onward)     Start     Ordered   05/18/21 0500  Creatinine, serum  (enoxaparin (LOVENOX)    CrCl >/= 30 ml/min)  Weekly,   R     Comments: while on enoxaparin therapy    05/11/21 [place]: None Equipment/Devices: None  Discharge Condition: Stable CODE STATUS: Full code Diet recommendation: Cardiac  Subjective: Seen and examined.  Granddaughter at the bedside.  Patient feels completely normal with no chest pain or any other complaints.  HPI: Ricardo [name] is a 80 y.o. male with medical history significant of CAD, hypertension and multiple others presents to the ED with complaint of chest pain.:  Patient, pain started around 12:40 PM today.  Pain was initially 6 and then became 10 out of 10.  He did 3 nitroglycerin back-to-back which did not help much.  Pain was pressure-like, radiating to the neck with no aggravating or relieving factor.  He denies having had any nausea, dizziness, diaphoresis, shortness of breath with it.  Soon after arrival to ED, his pain subsided.  Patient's wife is at the bedside   ED Course: Upon arrival to ED, he was bradycardic and hypertensive.  His heart rate dipped to 42.  Troponin slightly elevated.  CBC showed mild leukocytosis and thrombocytopenia.  BMP within normal range.  I do not see that he had received any medications in the ED.  ED physician discussed case with Dr. Quentin Ore of cardiology who recommended admission at [facility] long and  they will consult.  Brief/Interim Summary: This very pleasant and much younger looking patient was admitted under hospitalist service due to chest pain and also met criteria for hypertensive emergency and also had significant sinus bradycardia.  Cardiac enzymes were checked which were slightly elevated but flat not indicated of ACS.  Echo showed normal ejection fraction, grade 1 diastolic dysfunction and no wall motion abnormality.  Patient's chest pain had resolved even in the ED.  Cardiology saw the patient.  We resumed his home medications for hypertension but increased his hydralazine.  His blood pressure also improved and is back to baseline.  Cardiology does not plan to do any further work-up, his chest pain was likely secondary to hypertension.  His bradycardia also resolved and currently heart rate is within normal range.  Cardiology has cleared the patient for discharge with recommendation to increase his hydralazine to 50 mg 3 times daily and they will follow-up in the clinic.  Patient is also agreeable with the plan of discharge.  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  Leukocytosis also resolved which was nonspecific.  Discharge Diagnoses:  Active Problems:   CAD (coronary artery disease), native coronary artery   Hyperlipidemia   Essential hypertension   Chest pain at rest   Sinus bradycardia   Hypertensive emergency    Discharge Instructions   Allergies as of 05/12/2021       Reactions   Clonidine Hcl Other (See Comments)  Reaction to 0.2 mg tabs - caused dry mouth, drowsiness, lightheadedness - reported 07/08/2020 (no reaction to patch)   Cyclobenzaprine Other (See Comments)   Numbness in lips   Norvasc [amlodipine Besylate] Other (See Comments)   CAUSES TEETH TO FALL OUT        Medication List     TAKE these medications    acetaminophen 500 MG tablet Commonly known as: TYLENOL Take 1,000 mg by mouth daily as needed for headache  (pain).   aspirin 81 MG tablet Take 1 tablet (81 mg total) by mouth daily. What changed: when to take this   chlorthalidone 25 MG tablet Commonly known as: HYGROTON Take 0.5 tablets (12.5 mg total) by mouth daily.   cloNIDine 0.2 mg/24hr patch Commonly known as: CATAPRES - Dosed in mg/24 hr Place 0.2 mg onto the skin every Monday.   Co Q-10 200 MG Caps Take 200 mg by mouth daily.   ezetimibe 10 MG tablet Commonly known as: ZETIA Take 1 tablet (10 mg total) by mouth daily. What changed: when to take this   hydrALAZINE 50 MG tablet Commonly known as: APRESOLINE Take 1 tablet (50 mg total) by mouth 3 (three) times daily. What changed:  medication strength how much to take when to take this   hydrOXYzine 25 MG tablet Commonly known as: ATARAX/VISTARIL Take 25 mg by mouth every 8 (eight) hours as needed for itching (reaction to poison ivy).   isosorbide mononitrate 60 MG 24 hr tablet Commonly known as: IMDUR Take 1 tablet (60 mg total) by mouth daily.   nitroGLYCERIN 0.4 MG SL tablet Commonly known as: NITROSTAT Place 1 tablet (0.4 mg total) under the tongue every 5 (five) minutes as needed. What changed: reasons to take this   predniSONE 10 MG tablet Commonly known as: DELTASONE Take 10-60 mg by mouth See admin instructions. Tapered course started 05/09/2021 - take 6 tablets (60 mg) by mouth daily for 2 days, then take 5 tablets (50 mg) daily for 2 days, then take 4 tablets (40 mg) daily for 2 days, then take 3 tablets (30 mg) daily for 2 days, then take 2 tablets (20 mg) daily for 2 days, then take 1 tablet (10 mg) daily for 2 days, then stop.   Repatha SureClick 765 MG/ML Soaj Generic drug: Evolocumab Inject 140 mg into the skin See admin instructions. Inject 140 mg subcutaneously on the 1st and 15th of each month   rosuvastatin 5 MG tablet Commonly known as: CRESTOR Take 1 tablet (5 mg total) by mouth daily. What changed:  how much to take when to take this    sildenafil 100 MG tablet Commonly known as: VIAGRA Take 100 mg by mouth daily as needed for erectile dysfunction.   spironolactone 25 MG tablet Commonly known as: ALDACTONE Take 1 tablet (25 mg total) by mouth daily. What changed: when to take this   telmisartan 80 MG tablet Commonly known as: MICARDIS Take 1 tablet (80 mg total) by mouth daily. What changed: when to take this        Follow-up Information     O'Neal, Cassie Freer, MD Follow up.   Specialties: Cardiology, Internal Medicine, Radiology Why: The office will call to arrange a f/u appt in the next 2 weeks with either Dr. Audie Box or one of the PAs, NPs or pharmacists. Contact information: [place] [address] [phone]                Allergies  Allergen Reactions  Clonidine Hcl Other (See Comments)    Reaction to 0.2 mg tabs - caused dry mouth, drowsiness, lightheadedness - reported 07/08/2020 (no reaction to patch)   Cyclobenzaprine Other (See Comments)    Numbness in lips   Norvasc [Amlodipine Besylate] Other (See Comments)    CAUSES TEETH TO FALL OUT    Consultations: Cardiology   Procedures/Studies: DG Chest 2 View  Result Date: 05/11/2021 CLINICAL DATA:  Shortness of breath EXAM: CHEST - 2 VIEW COMPARISON:  07/07/2020 FINDINGS: The heart size and mediastinal contours are within normal limits. Both lungs are clear. Disc degenerative disease of the thoracic spine. IMPRESSION: No acute abnormality of the lungs. Electronically Signed   By: Eddie Candle M.D.   On: 05/11/2021 14:51   ECHOCARDIOGRAM COMPLETE  Result Date: 05/12/2021    ECHOCARDIOGRAM REPORT   Patient Name:   Ricardo [name] [facility] Date of Exam: 05/12/2021 Medical Rec #:  [id number]      Height:       69.0 in Accession #:    [id number]     Weight:       169.6 lb Date of Birth:  July 09, 1941      BSA:          1.926 m Patient Age:    62 years       BP:           125/52 mmHg Patient Gender: M              HR:           61 bpm.  Exam Location:  Inpatient Procedure: 2D Echo, Cardiac Doppler and Color Doppler Indications:    R07.9* Chest pain, unspecified  History:        Patient has prior history of Echocardiogram examinations, most                 recent 07/08/2020. CAD; Risk Factors:Hypertension and                 Dyslipidemia. Cancer.  Sonographer:    Tiffany Dance Referring Phys: [id number] Eldean Klatt  Sonographer Comments: Suboptimal subcostal window. IMPRESSIONS  1. Left ventricular ejection fraction, by estimation, is 60 to 65%. The left ventricle has normal function. The left ventricle has no regional wall motion abnormalities. There is mild concentric left ventricular hypertrophy. Left ventricular diastolic parameters are consistent with Grade I diastolic dysfunction (impaired relaxation).  2. Right ventricular systolic function is normal. The right ventricular size is normal. Tricuspid regurgitation signal is inadequate for assessing PA pressure.  3. The mitral valve is grossly normal. Trivial mitral valve regurgitation. No evidence of mitral stenosis.  4. The aortic valve is tricuspid. Aortic valve regurgitation is trivial. No aortic stenosis is present. FINDINGS  Left Ventricle: Left ventricular ejection fraction, by estimation, is 60 to 65%. The left ventricle has normal function. The left ventricle has no regional wall motion abnormalities. The left ventricular internal cavity size was normal in size. There is  mild concentric left ventricular hypertrophy. Left ventricular diastolic parameters are consistent with Grade I diastolic dysfunction (impaired relaxation). Right Ventricle: The right ventricular size is normal. No increase in right ventricular wall thickness. Right ventricular systolic function is normal. Tricuspid regurgitation signal is inadequate for assessing PA pressure. Left Atrium: Left atrial size was normal in size. Right Atrium: Right atrial size was normal in size. Pericardium: There is no evidence of  pericardial effusion. Mitral Valve: The mitral valve is grossly normal. Trivial mitral valve regurgitation. No evidence  of mitral valve stenosis. Tricuspid Valve: The tricuspid valve is grossly normal. Tricuspid valve regurgitation is trivial. No evidence of tricuspid stenosis. Aortic Valve: The aortic valve is tricuspid. Aortic valve regurgitation is trivial. No aortic stenosis is present. Pulmonic Valve: The pulmonic valve was grossly normal. Pulmonic valve regurgitation is not visualized. No evidence of pulmonic stenosis. Aorta: The aortic root and ascending aorta are structurally normal, with no evidence of dilitation. Venous: The inferior vena cava was not well visualized. IAS/Shunts: The atrial septum is grossly normal.  LEFT VENTRICLE PLAX 2D LVIDd:         3.30 cm  Diastology LVIDs:         2.60 cm  LV e' medial:    6.64 cm/s LV PW:         1.30 cm  LV E/e' medial:  8.7 LV IVS:        1.20 cm  LV e' lateral:   9.68 cm/s LVOT diam:     1.90 cm  LV E/e' lateral: 5.9 LV SV:         66 LV SV Index:   34 LVOT Area:     2.84 cm  RIGHT VENTRICLE RV Basal diam:  2.20 cm RV S prime:     11.30 cm/s TAPSE (M-mode): 2.0 cm LEFT ATRIUM             Index       RIGHT ATRIUM           Index LA diam:        4.30 cm 2.23 cm/m  RA Area:     11.80 cm LA Vol (A2C):   74.8 ml 38.83 ml/m RA Volume:   23.50 ml  12.20 ml/m LA Vol (A4C):   44.7 ml 23.21 ml/m LA Biplane Vol: 58.4 ml 30.32 ml/m  AORTIC VALVE LVOT Vmax:   98.50 cm/s LVOT Vmean:  63.700 cm/s LVOT VTI:    0.232 m  AORTA Ao Root diam: 3.30 cm Ao Asc diam:  3.30 cm MITRAL VALVE MV Area (PHT): 2.73 cm    SHUNTS MV Decel Time: 278 msec    Systemic VTI:  0.23 m MV E velocity: 57.50 cm/s  Systemic Diam: 1.90 cm MV A velocity: 97.30 cm/s MV E/A ratio:  0.59 Eleonore Chiquito MD Electronically signed by Eleonore Chiquito MD Signature Date/Time: 05/12/2021/10:05:24 AM    Final      Discharge Exam: Vitals:   05/12/21 0900 05/12/21 1000  BP: (!) 125/52 (!) 153/67  Pulse: 65 66   Resp: 19 17  Temp:    SpO2: 97% 98%   Vitals:   05/12/21 0800 05/12/21 0805 05/12/21 0900 05/12/21 1000  BP: (!) 118/46  (!) 125/52 (!) 153/67  Pulse: (!) 59  65 66  Resp: (!) _0 Temp:  99 F (37.2 C)    TempSrc:  Oral    SpO2: 97%  97% 98%    General: Pt is alert, awake, not in acute distress Cardiovascular: RRR, S1/S2 +, no rubs, no gallops Respiratory: CTA bilaterally, no wheezing, no rhonchi Abdominal: Soft, NT, ND, bowel sounds + Extremities: no edema, no cyanosis    The results of significant diagnostics from this hospitalization (including imaging, microbiology, ancillary and laboratory) are listed below for reference.     Microbiology: Recent Results (from the past 240 hour(s))  SARS CORONAVIRUS 2 (TAT 6-24 HRS) Nasopharyngeal Nasopharyngeal Swab     Status: None   Collection Time: 05/11/21  3:19 PM  Specimen: Nasopharyngeal Swab  Result Value Ref Range Status   SARS Coronavirus 2 NEGATIVE NEGATIVE Final    Comment: (NOTE) SARS-CoV-2 target nucleic acids are NOT DETECTED.  The SARS-CoV-2 RNA is generally detectable in upper and lower respiratory specimens during the acute phase of infection. Negative results do not preclude SARS-CoV-2 infection, do not rule out co-infections with other pathogens, and should not be used as the sole basis for treatment or other patient management decisions. Negative results must be combined with clinical observations, patient history, and epidemiological information. The expected result is Negative.  Fact Sheet for Patients: SugarRoll.be  Fact Sheet for Healthcare Providers: https://www.woods-mathews.com/  This test is not yet approved or cleared by the [place] FDA and  has been authorized for detection and/or diagnosis of SARS-CoV-2 by FDA under an Emergency Use Authorization (EUA). This EUA will remain  in effect (meaning this test can be used) for the duration of  the COVID-19 declaration under Se ction 564(b)(1) of the Act, 21 U.S.C. section 360bbb-3(b)(1), unless the authorization is terminated or revoked sooner.  Performed at [facility], [place] [address], [place], [place] [address]   MRSA PCR Screening     Status: None   Collection Time: 05/11/21  6:58 PM  Result Value Ref Range Status   MRSA by PCR NEGATIVE NEGATIVE Final    Comment:        The GeneXpert MRSA Assay (FDA approved for NASAL specimens only), is one component of a comprehensive MRSA colonization surveillance program. It is not intended to diagnose MRSA infection nor to guide or monitor treatment for MRSA infections. Performed at [facility], [place] [address], [place],  [address]      Labs: BNP (last 3 results) No results for input(s): BNP in the last 8760 hours. Basic Metabolic Panel: Recent Labs  Lab 05/11/21 1317 05/11/21 1600  NA 145  --   K 3.9  --   CL 111  --   CO2 25  --   GLUCOSE 102*  --   BUN 36*  --   CREATININE 1.06 0.80  CALCIUM 8.9  --    Liver Function Tests: No results for input(s): AST, ALT, ALKPHOS, BILITOT, PROT, ALBUMIN in the last 168 hours. No results for input(s): LIPASE, AMYLASE in the last 168 hours. No results for input(s): AMMONIA in the last 168 hours. CBC: Recent Labs  Lab 05/11/21 1317 05/11/21 1600  WBC 12.5* 9.3  HGB 12.1* 9.9*  HCT 37.5* 31.6*  MCV 89.3 93.5  PLT 120* 110*   Cardiac Enzymes: No results for input(s): CKTOTAL, CKMB, CKMBINDEX, TROPONINI in the last 168 hours. BNP: Invalid input(s): POCBNP CBG: No results for input(s): GLUCAP in the last 168 hours. D-Dimer No results for input(s): DDIMER in the last 72 hours. Hgb A1c No results for input(s): HGBA1C in the last 72 hours. Lipid Profile No results for input(s): CHOL, HDL, LDLCALC, TRIG, CHOLHDL, LDLDIRECT in the last 72 hours. Thyroid function studies No results for input(s): TSH, T4TOTAL, T3FREE, THYROIDAB in the  last 72 hours.  Invalid input(s): FREET3 Anemia work up No results for input(s): VITAMINB12, FOLATE, FERRITIN, TIBC, IRON, RETICCTPCT in the last 72 hours. Urinalysis    Component Value Date/Time   COLORURINE YELLOW 07/08/2020 1139   APPEARANCEUR CLEAR 07/08/2020 1139   LABSPEC 1.012 07/08/2020 1139   PHURINE 7.0 07/08/2020 1139   GLUCOSEU NEGATIVE 07/08/2020 1139   HGBUR NEGATIVE 07/08/2020 [place] 07/08/2020 1139  KETONESUR NEGATIVE 07/08/2020 1139   PROTEINUR NEGATIVE 07/08/2020 1139   NITRITE NEGATIVE 07/08/2020 1139   LEUKOCYTESUR NEGATIVE 07/08/2020 1139   Sepsis Labs Invalid input(s): PROCALCITONIN,  WBC,  LACTICIDVEN Microbiology Recent Results (from the past 240 hour(s))  SARS CORONAVIRUS 2 (TAT 6-24 HRS) Nasopharyngeal Nasopharyngeal Swab     Status: None   Collection Time: 05/11/21  3:19 PM   Specimen: Nasopharyngeal Swab  Result Value Ref Range Status   SARS Coronavirus 2 NEGATIVE NEGATIVE Final    Comment: (NOTE) SARS-CoV-2 target nucleic acids are NOT DETECTED.  The SARS-CoV-2 RNA is generally detectable in upper and lower respiratory specimens during the acute phase of infection. Negative results do not preclude SARS-CoV-2 infection, do not rule out co-infections with other pathogens, and should not be used as the sole basis for treatment or other patient management decisions. Negative results must be combined with clinical observations, patient history, and epidemiological information. The expected result is Negative.  Fact Sheet for Patients: SugarRoll.be  Fact Sheet for Healthcare Providers: https://www.woods-mathews.com/  This test is not yet approved or cleared by the [place] FDA and  has been authorized for detection and/or diagnosis of SARS-CoV-2 by FDA under an Emergency Use Authorization (EUA). This EUA will remain  in effect (meaning this test can be used) for the duration of  the COVID-19 declaration under Se ction 564(b)(1) of the Act, 21 U.S.C. section 360bbb-3(b)(1), unless the authorization is terminated or revoked sooner.  Performed at [facility], [place] [address] [place], [place] [address]   MRSA PCR Screening     Status: None   Collection Time: 05/11/21  6:58 PM  Result Value Ref Range Status   MRSA by PCR NEGATIVE NEGATIVE Final    Comment:        The GeneXpert MRSA Assay (FDA approved for NASAL specimens only), is one component of a comprehensive MRSA colonization surveillance program. It is not intended to diagnose MRSA infection nor to guide or monitor treatment for MRSA infections. Performed at [facility], [place] [address] [place], [place] [address]      Time coordinating discharge: Over 30 minutes  SIGNED:   Darliss Cheney, MD  [facility] 05/12/2021, 10:47 AM  If 7PM-7AM, please contact night-coverage [email or web address]

## 2021-05-12 NOTE — Progress Notes (Signed)
  Echocardiogram 2D Echocardiogram has been performed.  Ricardo [name] 05/12/2021, 9:48 AM

## 2021-05-13 ENCOUNTER — Telehealth: Payer: Self-pay | Admitting: Cardiovascular Disease

## 2021-05-13 NOTE — Telephone Encounter (Signed)
Patient's daughter a would like to know they changed his medications in the hospital. She would like to know if he needs to continue with these changes or go back to Dr. Kathalene Frames medication instructions.

## 2021-05-13 NOTE — Telephone Encounter (Signed)
Daughter called to confirm whether pt should continue with med changes from the hospital. Nurse informed daughter that pt should take medications as directed on discharge summary. Daughter verbalized understanding.

## 2021-05-27 ENCOUNTER — Ambulatory Visit (HOSPITAL_BASED_OUTPATIENT_CLINIC_OR_DEPARTMENT_OTHER): Payer: Medicare Other | Admitting: Family

## 2021-05-27 ENCOUNTER — Encounter (HOSPITAL_BASED_OUTPATIENT_CLINIC_OR_DEPARTMENT_OTHER): Payer: Self-pay | Admitting: Family

## 2021-05-27 ENCOUNTER — Other Ambulatory Visit: Payer: Self-pay

## 2021-05-27 VITALS — BP 162/60 | HR 78 | Ht 68.0 in | Wt 175.6 lb

## 2021-05-27 DIAGNOSIS — E785 Hyperlipidemia, unspecified: Secondary | ICD-10-CM | POA: Diagnosis not present

## 2021-05-27 DIAGNOSIS — I714 Abdominal aortic aneurysm, without rupture, unspecified: Secondary | ICD-10-CM

## 2021-05-27 DIAGNOSIS — I25118 Atherosclerotic heart disease of native coronary artery with other forms of angina pectoris: Secondary | ICD-10-CM | POA: Diagnosis not present

## 2021-05-27 DIAGNOSIS — R42 Dizziness and giddiness: Secondary | ICD-10-CM

## 2021-05-27 DIAGNOSIS — I951 Orthostatic hypotension: Secondary | ICD-10-CM

## 2021-05-27 DIAGNOSIS — I10 Essential (primary) hypertension: Secondary | ICD-10-CM | POA: Diagnosis not present

## 2021-05-27 NOTE — Progress Notes (Addendum)
Office Visit    Patient Name: Ricardo [name] Date of Encounter: 05/27/2021  PCP:  Maury Dus, [place]  Cardiologist:  Evalina Field, MD  Advanced Practice Provider:  No care team member to display Electrophysiologist:  None    Chief Complaint    Ricardo [name] is a 80 y.o. 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 Doylene Canard   Coronary artery disease CARDIOLOGIST- DR Wynonia Lawman-  LAST VISIT 2 WKS AGO-- WILL REQUEST NOTE AND EKG   STRESS TEST -- JULY 2009   Essential hypertension 08/30/2018   Hyperlipidemia    Hypertension    Nocturia    Personal history of malignant neoplasm of bladder 08/30/2018   Post PTCA 2001-   X1 STENT TO RCA   AND 1996   Past Surgical History:  Procedure Laterality Date   CORONARY ANGIOPLASTY  1996   CORONARY ANGIOPLASTY WITH STENT PLACEMENT  2001   STENT TO RCA   CYSTO/ BLADDER BX  10-22-10   CYSTOSCOPY W/ RETROGRADES  11/12/2011   Procedure: CYSTOSCOPY WITH RETROGRADE PYELOGRAM;  Surgeon: Molli Hazard, MD;  Location: [facility];  Service: Urology;  Laterality: Bilateral;   LEFT HEART CATH AND CORONARY ANGIOGRAPHY N/A 07/09/2020   Procedure: LEFT HEART CATH AND CORONARY ANGIOGRAPHY;  Surgeon: Dixie Dials, MD;  Location: [facility];  Service: Cardiovascular;  Laterality: N/A;   LUMBAR FUSION     TRANSURETHRAL RESECTION OF BLADDER TUMOR  08-27-10   RIGHT ANTERIOR DOME   TRANSURETHRAL RESECTION OF  BLADDER TUMOR  11/12/2011   Procedure: TRANSURETHRAL RESECTION OF BLADDER TUMOR (TURBT);  Surgeon: Molli Hazard, MD;  Location: [facility];  Service: Urology;  Laterality: N/A;  with PK Gyrus c-arm gyrus  digital ureteroscope    Allergies  Allergies  Allergen Reactions   Clonidine Hcl Other (See Comments)    Reaction to 0.2 mg tabs - caused dry mouth, drowsiness, lightheadedness - reported 07/08/2020 (no reaction to patch)   Cyclobenzaprine Other (See Comments)    Numbness in lips   Norvasc [Amlodipine Besylate] Other (See Comments)    CAUSES TEETH TO FALL OUT    History of Present Illness    Ricardo [name] is a 80 y.o. male with a hx of CAD, hyperlipidemia, hypertension, AAA last seen while hospitalized.  Prior RCA stent in 2001.  He was admitted August 2021 for unstable angina.  He was found of mild to moderate nonobstructive CAD.  He was most recently seen in clinic 02/26/2021 by Dr. Davina Poke.  His LDL is at goal of less than 70 and his blood pressure was 140/66.  AAA duplex ordered for monitoring showed the AAA now measures 3.8 cm (3.6 in 2020) with recommendation for repeat ultrasound in 1 year.  He was admitted 05/11/21-05/12/21 due to chest pain, hypertensive emergency, bradycardia. Echo with normal LVEF, gr1DD, no RWMA. His  Hydralazine was increased to 50mg  TID. Chest pain presumed due to hypertension. Bradycardia resolved.  He presents today for follow up with his daughter who has been staying with him and his wife since hospital discharge. Tells me he has been dizzy since hospital discharge. Tells methe dizziness feels like a lightheadedness as swell as a spinning sensation. It is happening "every time I take the pills".  No near-syncope nor syncope.  Reports no falls. Blood pressure has been fluctuating 86/41 - 150s/90s when checked with an arm cuff at various times throughout the day. Tells me he has to use the restroom constantly. Feels very dehydrated.    He takes his Hydralazine at 7am, 2pm, and 9pm.  He takes his telmisartan, rosuvastatin, Imdur. He eats a banana with almond milk with medications arount morning medications at 7am,  brunch around 10am or 11am (eggs, bacon, fruit, coffee), then will have late lunch or early dinner around 5pm or 6pm. He tells me a couple days ago he started drinking more water. His daughter tells me they started magnesium supplement  for him and his wife as they have been cramping.   EKGs/Labs/Other Studies Reviewed:   The following studies were reviewed today:  Echo 05/12/21 1. Left ventricular ejection fraction, by estimation, is 60 to 65%. The  left ventricle has normal function. The left ventricle has no regional  wall motion abnormalities. There is mild concentric left ventricular  hypertrophy. Left ventricular diastolic  parameters are consistent with Grade I diastolic dysfunction (impaired  relaxation).   2. Right ventricular systolic function is normal. The right ventricular  size is normal. Tricuspid regurgitation signal is inadequate for assessing  PA pressure.   3. The mitral valve is grossly normal. Trivial mitral valve  regurgitation. No evidence of mitral stenosis.   4. The aortic valve is tricuspid. Aortic valve regurgitation is trivial.  No aortic stenosis is present.   Carotid USN  11/2020 Mild plaquing (6-21%) LICA, RICA     [place] AAA 02/2021 Summary: Abdominal Aorta: There is evidence of abnormal dilatation of the distal Abdominal aorta. There is evidence of abnormal dilation of the Right Common Iliac artery and Left External Iliac artery. The largest aortic diameter remains essentially unchanged compared to prior exam, today measures 3.8cm. Previous diameter measurement was 3.6 cm obtained on 09/15/2019.   TTE 07/08/2020  1. Left ventricular ejection fraction, by estimation, is 55 to 60%. The  left ventricle has normal function. The left ventricle has no regional  wall motion  abnormalities. Left ventricular diastolic parameters are  consistent with Grade I diastolic  dysfunction (impaired relaxation).   2. Right ventricular systolic function is normal. The right ventricular  size is normal.   3. Left atrial size was mildly dilated.   4. The mitral valve is degenerative. Mild mitral valve regurgitation.   5. The aortic valve is tricuspid. Aortic valve regurgitation is trivial.  Mild aortic valve sclerosis is present, with no evidence of aortic valve  stenosis.   6. There is mild (Grade II) atheroma plaque involving the ascending  aorta.   7. The inferior vena cava is normal in size with greater than 50%  respiratory variability, suggesting right atrial pressure of 3 mmHg.   LHC 07/09/2020   Ost LAD to Prox LAD lesion is 50% stenosed. Mid LAD to Dist LAD lesion is 50% stenosed. Ost RCA lesion is 40% stenosed. Prox RCA lesion is 40% stenosed. Mid RCA lesion is 40% stenosed. 2nd Diag lesion is 50% stenosed. 2nd  Mrg lesion is 60% stenosed. Mid Cx lesion is 30% stenosed.   Mild to moderate multivessel disease. Add isosorbide and ASA and Plavix for 6 months and life style modification.    EKG:  No EKG today  Recent Labs: 07/09/2020: Magnesium 2.3 05/11/2021: BUN 36; Creatinine, Ser 0.80; Hemoglobin 9.9; Platelets 110; Potassium 3.9; Sodium 145  Recent Lipid Panel    Component Value Date/Time   CHOL 100 02/18/2021 1011   TRIG 78 02/18/2021 1011   HDL 50 02/18/2021 1011   CHOLHDL 2.0 02/18/2021 1011   CHOLHDL 4.2 07/09/2020 0757   VLDL 15 07/09/2020 0757   LDLCALC 34 02/18/2021 1011    Home Medications   Current Meds  Medication Sig   acetaminophen (TYLENOL) 500 MG tablet Take 1,000 mg by mouth daily as needed for headache (pain).   aspirin 81 MG tablet Take 1 tablet (81 mg total) by mouth daily.   chlorthalidone (HYGROTON) 25 MG tablet Take 0.5 tablets (12.5 mg total) by mouth daily.   cloNIDine (CATAPRES - DOSED IN MG/24 HR) 0.2 mg/24hr patch  Place 0.2 mg onto the skin every Monday.   Evolocumab (REPATHA SURECLICK) 185 MG/ML SOAJ Inject 140 mg into the skin See admin instructions. Inject 140 mg subcutaneously on the 1st and 15th of each month   hydrALAZINE (APRESOLINE) 50 MG tablet Take 1 tablet (50 mg total) by mouth 3 (three) times daily.   isosorbide mononitrate (IMDUR) 60 MG 24 hr tablet Take 1 tablet (60 mg total) by mouth daily.   nitroGLYCERIN (NITROSTAT) 0.4 MG SL tablet Place 1 tablet (0.4 mg total) under the tongue every 5 (five) minutes as needed.   rosuvastatin (CRESTOR) 5 MG tablet Take 1 tablet (5 mg total) by mouth daily.   telmisartan (MICARDIS) 80 MG tablet Take 1 tablet (80 mg total) by mouth daily.   [DISCONTINUED] spironolactone (ALDACTONE) 25 MG tablet Take 1 tablet (25 mg total) by mouth daily.     Review of Systems      All other systems reviewed and are otherwise negative except as noted above.  Physical Exam    VS:  BP (!) 162/60   Pulse 78   Ht 5\' 8"  (1.727 m)   Wt 175 lb 9.6 oz (79.7 kg)   SpO2 95%   BMI 26.70 kg/m  , BMI Body mass index is 26.7 kg/m.  Wt Readings from Last 3 Encounters:  05/27/21 175 lb 9.6 oz (79.7 kg)  02/26/21 169 lb 9.6 oz (76.9 kg)  11/20/20 175 lb 12.8 oz (79.7 kg)    GEN: Well nourished, well developed, in no acute distress. HEENT: normal. Neck: Supple, no JVD, carotid bruits, or masses. Cardiac: RRR, no murmurs, rubs, or gallops. No clubbing, cyanosis, edema.  Radials/PT 2+ and equal bilaterally.  Respiratory:  Respirations regular and unlabored, clear to auscultation bilaterally. GI: Soft, nontender, nondistended. MS: No deformity or atrophy. Skin: Warm and dry, no rash. Neuro:  Strength and sensation are intact. Psych: Normal affect.  Assessment & Plan    CAD - Stable with no anginal symptoms. No indication for ischemic evaluation.  GDMT includes aspirin, imdur, rosuvastatin, repatha, PRN nitroglycerin. Heart healthy diet and regular cardiovascular exercise  encouraged.    HTN /orthostatic hypotension/lightheadedness /dizziness -Reports lightheadedness and dizziness since hospital discharge.  Blood pressure at home have been labile.  He takes all of blood pressure medication in the morning with the exception of hydralazine which he takes TID which I anticipate is contributory to symptoms. Will adjust timing  of medications for more regular blood pressure control. Continue Hydralazine 50mg  TID. Continue chlorthalidone 12.5 mg daily in the morning.  Change timing of telmisartan to 80 mg in the afternoon.  Change timing of Imdur to 60 mg in the evening.  Change timing of rosuvastatin to 5 mg in the evening. Plan for BMP, magnesium, CBC due to reports of cramping to reassess on diuretic therapy and to rule out anemia as contributory to hypertension. If orthostasis continues, consider discontinuation of chlorthalidone. If dizziness and lightheadedness persist, consider ZIO at follow up.   HLD -02/18/2021 LDL 34.  Continue Repatha, Rosuvastatin.   AAA (2022 3.8cm) -continue optimal blood pressure control.  Plan for repeat AAA duplex 02/2022 for monitoring.   Disposition: Follow up in 1 month(s) with Dr. Audie Box or APP.  Signed, Loel Dubonnet, NP 05/27/2021, 9:22 PM [place]

## 2021-05-27 NOTE — Patient Instructions (Addendum)
Medication Instructions:  Your physician has recommended you make the following change in your medication:   We are changing the timing of your medications.   Take Hydralazine three times per day at 7am, 2pm, and 9pm.   Chlorthalidone in the morning at 7am  Telmisartan in the afternoon at 2pm  Rosuvastatin in the evening at 9pm  Imdur in the evening at 9pm   *If you need a refill on your cardiac medications before your next appointment, please call your pharmacy*   Lab Work: Your physician recommends that you return for lab work today or tomorrow: BMP, magnesium, CBC  If you have labs (blood work) drawn today and your tests are completely normal, you will receive your results only by: [place] (if you have [place]) OR A paper copy in the mail If you have any lab test that is abnormal or we need to change your treatment, we will call you to review the results.   Testing/Procedures: None ordered today.    Follow-Up: At [facility], you and your health needs are our priority.  As part of our continuing mission to provide you with exceptional heart care, we have created designated Provider Care Teams.  These Care Teams include your primary Cardiologist (physician) and Advanced Practice Providers (APPs -  Physician Assistants and Nurse Practitioners) who all work together to provide you with the care you need, when you need it.  We recommend signing up for the patient portal called "MyChart".  Sign up information is provided on this After Visit Summary.  MyChart is used to connect with patients for Virtual Visits (Telemedicine).  Patients are able to view lab/test results, encounter notes, upcoming appointments, etc.  Non-urgent messages can be sent to your provider as well.   To learn more about what you can do with MyChart, go to [email or web address].    Your next appointment:   1 month(s) with Roby Lofts, PA-C on Monday, August 29 @ 11:15 am.  The format for  your next appointment:   In Person  Provider:   You may see the following Advanced Practice Providers on your designated Care Team:   Roby Lofts, [place]   Other Instructions  Heart Healthy Diet Recommendations: A low-salt diet is recommended. Meats should be grilled, baked, or boiled. Avoid fried foods. Focus on lean protein sources like fish or chicken with vegetables and fruits. The American Heart Association is a Microbiologist!  American Heart Association Diet and Lifeystyle Recommendations   Heart Healthy Diet Recommendations: A low-salt diet is recommended. Meats should be grilled, baked, or boiled. Avoid fried foods. Focus on lean protein sources like fish or chicken with vegetables and fruits. The American Heart Association is a Microbiologist!  American Heart Association Diet and Lifeystyle Recommendations     Hypotension As your heart beats, it forces blood through your body. This force is called blood pressure. If you have hypotension, you have low blood pressure. When your blood pressure is too low, you may not get enough blood to your brain or other parts of your body. This may cause you to feel weak, light-headed, have a fast heartbeat, or even pass out (faint). Low blood pressure may be harmless, or it may cause serious problems. What are the causes? Blood loss. Not enough water in the body (dehydration). Heart problems. Hormone problems. Pregnancy. A very bad infection. Not having enough of certain nutrients. Very bad allergic reactions. Certain medicines. What increases the risk? Age. The risk increases as you get  older. Conditions that affect the heart or the brain and spinal cord (central nervous system). Taking certain medicines. Being pregnant. What are the signs or symptoms? Feeling: Weak. Light-headed. Dizzy. Tired (fatigued). Blurred vision. Fast heartbeat. Passing out, in very bad cases. How is this treated? Changing your diet. This may involve  eating more salt (sodium) or drinking more water. Taking medicines to raise your blood pressure. Changing how much you take (the dosage) of some of your medicines. Wearing compression stockings. These stockings help to prevent blood clots and reduce swelling in your legs. In some cases, you may need to go to the hospital for: Fluid replacement. This means you will receive fluids through an IV tube. Blood replacement. This means you will receive donated blood through an IV tube (transfusion). Treating an infection or heart problems, if this applies. Monitoring. You may need to be monitored while medicines that you are taking wear off. Follow these instructions at home: Eating and drinking  Drink enough fluids to keep your pee (urine) pale yellow. Eat a healthy diet. Follow instructions from your doctor about what you can eat or drink. A healthy diet includes: Fresh fruits and vegetables. Whole grains. Low-fat (lean) meats. Low-fat dairy products. Eat extra salt only as told. Do not add extra salt to your diet unless your doctor tells you to. Eat small meals often. Avoid standing up quickly after you eat.  Medicines Take over-the-counter and prescription medicines only as told by your doctor. Follow instructions from your doctor about changing how much you take of your medicines, if this applies. Do not stop or change any of your medicines on your own. General instructions  Wear compression stockings as told by your doctor. Get up slowly from lying down or sitting. Avoid hot showers and a lot of heat as told by your doctor. Return to your normal activities as told by your doctor. Ask what activities are safe for you. Do not use any products that contain nicotine or tobacco, such as cigarettes, e-cigarettes, and chewing tobacco. If you need help quitting, ask your doctor. Keep all follow-up visits as told by your doctor. This is important.  Contact a doctor if: You throw up  (vomit). You have watery poop (diarrhea). You have a fever for more than 2-3 days. You feel more thirsty than normal. You feel weak and tired. Get help right away if: You have chest pain. You have a fast or uneven heartbeat. You lose feeling (have numbness) in any part of your body. You cannot move your arms or your legs. You have trouble talking. You get sweaty or feel light-headed. You pass out. You have trouble breathing. You have trouble staying awake. You feel mixed up (confused). Summary Hypotension is also called low blood pressure. It is when the force of blood pumping through your arteries is too weak. Hypotension may be harmless, or it may cause serious problems. Treatment may include changing your diet and medicines, and wearing compression stockings. In very bad cases, you may need to go to the hospital. This information is not intended to replace advice given to you by your health care provider. Make sure you discuss any questions you have with your healthcare provider. Document Revised: 04/29/2018 Document Reviewed: 04/29/2018 Elsevier Patient Education  [place].

## 2021-05-28 LAB — BASIC METABOLIC PANEL
BUN/Creatinine Ratio: 25 — ABNORMAL HIGH (ref 10–24)
BUN: 27 mg/dL (ref 8–27)
CO2: 23 mmol/L (ref 20–29)
Calcium: 9.3 mg/dL (ref 8.6–10.2)
Chloride: 101 mmol/L (ref 96–106)
Creatinine, Ser: 1.08 mg/dL (ref 0.76–1.27)
Glucose: 88 mg/dL (ref 65–99)
Potassium: 4.2 mmol/L (ref 3.5–5.2)
Sodium: 140 mmol/L (ref 134–144)
eGFR: 69 mL/min/{1.73_m2} (ref >59)

## 2021-05-28 LAB — CBC
Hematocrit: 37.7 % (ref 37.5–51.0)
Hemoglobin: 12.7 g/dL — ABNORMAL LOW (ref 13.0–17.7)
MCH: 28.8 pg (ref 26.6–33.0)
MCHC: 33.7 g/dL (ref 31.5–35.7)
MCV: 86 fL (ref 79–97)
Platelets: 115 10*3/uL — ABNORMAL LOW (ref 150–450)
RBC: 4.41 x10E6/uL (ref 4.14–5.80)
RDW: 11.8 % (ref 11.6–15.4)
WBC: 6.9 10*3/uL (ref 3.4–10.8)

## 2021-05-28 LAB — MAGNESIUM: Magnesium: 2.1 mg/dL (ref 1.6–2.3)

## 2021-05-29 ENCOUNTER — Ambulatory Visit: Payer: Medicare Other

## 2021-06-03 ENCOUNTER — Encounter (HOSPITAL_BASED_OUTPATIENT_CLINIC_OR_DEPARTMENT_OTHER): Payer: Self-pay

## 2021-06-07 NOTE — Telephone Encounter (Signed)
Previous MyChart message not read. Called patient to check in on symptoms. He tells me his symptoms have much improved since adjusting the timing of his medications. Tells me he intermittently gets lightheaded still but much less frequent and only lasts a short while.His blood pressure at home has been 131/61, 137/72, 134/67, 138/75. He is agreeable to continue his current medications and will contact us if he notices increasing lightheadedness.  Loel Dubonnet, NP

## 2021-06-15 DIAGNOSIS — K047 Periapical abscess without sinus: Secondary | ICD-10-CM | POA: Diagnosis not present

## 2021-06-18 ENCOUNTER — Telehealth: Payer: Self-pay | Admitting: Cardiovascular Disease

## 2021-07-10 ENCOUNTER — Telehealth: Payer: Self-pay | Admitting: Cardiovascular Disease

## 2021-07-10 ENCOUNTER — Other Ambulatory Visit: Payer: Self-pay | Admitting: *Deleted

## 2021-07-10 ENCOUNTER — Ambulatory Visit (INDEPENDENT_AMBULATORY_CARE_PROVIDER_SITE_OTHER): Payer: Medicare Other

## 2021-07-10 ENCOUNTER — Other Ambulatory Visit (HOSPITAL_BASED_OUTPATIENT_CLINIC_OR_DEPARTMENT_OTHER): Payer: Self-pay | Admitting: Family

## 2021-07-10 DIAGNOSIS — R42 Dizziness and giddiness: Secondary | ICD-10-CM

## 2021-07-10 MED ORDER — HYDRALAZINE HCL 50 MG PO TABS: 50.0000 mg | ORAL_TABLET | Freq: Three times a day (TID) | ORAL | 1 refills | Status: DC

## 2021-07-10 NOTE — Telephone Encounter (Signed)
*  STAT* If patient is at the pharmacy, call can be transferred to refill team.   1. Which medications need to be refilled? (please list name of each medication and dose if known) hydrALAZINE (APRESOLINE) 50 MG tablet (Expired)  2. Which pharmacy/location (including street and city if local pharmacy) is medication to be sent to? [facility] [id number] - [place], [place]  3. Do they need a 30 day or 90 day supply? 90 day supply   Pt is out of medication.he has a appt on 11/15 with [facility] and 8/29 with [facility]

## 2021-07-10 NOTE — Telephone Encounter (Signed)
Contacted Ricardo [name] who confirms he is taking Hydralazine three times per day. He is uncertain what the dosage is. Contacted pharmacy who confirmed Hydralazine '50mg'$  TID is last dose picked up. Will update previous office note to reflect correct dosage. Okay to approve refill as requested Hydralazine '50mg'$  TID.   Loel Dubonnet, NP

## 2021-07-10 NOTE — Progress Notes (Unsigned)
Enrolled patient for a 14 day Zio XT  monitor to be mailed to patients home  °

## 2021-07-10 NOTE — Telephone Encounter (Signed)
Rx sent to [facility] as requested. Thanks for making me aware! Loel Dubonnet, NP

## 2021-07-12 DIAGNOSIS — R42 Dizziness and giddiness: Secondary | ICD-10-CM | POA: Diagnosis not present

## 2021-07-13 NOTE — Progress Notes (Signed)
Cardiology Office Note   Date:  07/15/2021   ID:  Ricardo, [name] January 27, 1941, MRN [id number]  PCP:  Maury Dus, MD  Cardiologist:  Evalina Field, MD EP: None  Chief Complaint  Patient presents with   Hypertension   Dizziness       History of Present Illness: Ricardo [name] is a 80 y.o. male with a PMH of CAD s/p stent to RCA in 2001, HTN, HLD, AAA, who presents for close follow-up of HTN.  He was last evaluated by cardiology at an outpatient visit with Laurann Montana, NP 05/27/2021 for post-hospital follow-up. He had complaints of dizziness/lightheadedness since discharge but no complaints of falls, near-syncope, or syncope.  He had reports of labile blood pressures at home which were felt to be contributing to his symptoms. The timing of his medications were adjusted to hopefully minimize symptoms - recommended for hydralazine '50mg'$  TID, chlorthalidone 12.'5mg'$  daily in AM, telmesartan '80mg'$  daily in the afternoon, and imdur '60mg'$  daily in th evening. Crestor also recommended to be taken in the evening. In follow-up MyChart messages, patient noticed improvement but not resolution of his dizziness and was recommended to undergo a 14 day zio patch monitor and increase his imdur to '90mg'$  daily. He had a cardiac catheterization 06/2020 to evaluate unstable angina which showed mild-moderate 3-vessel CAD. His last echocardiogram 04/2021 showed EF 60-65%, mild LVH, G1DD, no RWMA, and no significant valvular abnormalities. His last AAA duplex 02/2021 showed aneurysm measuring 3.8cm and moderate atherosclerosis throughout aorta and iliac arteries; recommended to repeat in 1 year.   He presents today with his daughter for close follow-up. He reports overall improvement in his dizziness since medication adjustments above. More bothersome recently has been a flushing sensation in his face/head since discharge from the hospital 04/2021. He reports a constant flushing sensation which waxes/wanes in  severity throughout the day but is present upon waking and going to bed at night. Blood pressures have overall improved with readings varying from 110s-160s/60s-70s, predominately in the 120s-140s/60s. We reviewed his medication changes during his recent admission to try to pinpoint the etiology of his flushing sensation - seems they increase in hydralazine from '25mg'$  TID to '50mg'$  TID may be a possible contributor. He also reported an isolated episode of chest pain 2 days ago which occurred just prior to going to bed at night. Described a pressure like sensation which radiated into his neck but no associated SOB or diaphoresis. He took 1 SL nitro with resolution of symptoms after ~30 minutes. He has not had any exertional chest pain with climbing stairs though has had DOE since discharge which I suspect is partly attributed to deconditioning. Family is concerned as this is only the 3rd time in his life he has taken SL nitro. He has occasional orthostatic symptoms but no falls or syncope. We discussed orthostatic precautions and use of compression stockings.     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 Doylene Canard   Coronary artery disease CARDIOLOGIST- DR Wynonia Lawman-  LAST VISIT 2 WKS AGO-- WILL REQUEST  NOTE AND EKG   STRESS TEST -- JULY 2009   Essential hypertension 08/30/2018   Hyperlipidemia    Hypertension    Nocturia    Personal history of malignant neoplasm of bladder 08/30/2018   Post PTCA 2001-   X1 STENT TO RCA   AND 1996    Past Surgical History:  Procedure Laterality Date   CORONARY ANGIOPLASTY  1996   CORONARY ANGIOPLASTY WITH  STENT PLACEMENT  2001   STENT TO RCA   CYSTO/ BLADDER BX  10-22-10   CYSTOSCOPY W/ RETROGRADES  11/12/2011   Procedure: CYSTOSCOPY WITH RETROGRADE PYELOGRAM;  Surgeon: Molli Hazard, MD;  Location: [facility];  Service: Urology;  Laterality: Bilateral;   LEFT HEART CATH AND CORONARY ANGIOGRAPHY N/A 07/09/2020   Procedure: LEFT HEART CATH AND CORONARY ANGIOGRAPHY;  Surgeon: Dixie Dials, MD;  Location: [facility];  Service: Cardiovascular;  Laterality: N/A;   LUMBAR FUSION     TRANSURETHRAL RESECTION OF BLADDER TUMOR  08-27-10   RIGHT ANTERIOR DOME   TRANSURETHRAL RESECTION OF BLADDER TUMOR  11/12/2011   Procedure: TRANSURETHRAL RESECTION OF BLADDER TUMOR (TURBT);  Surgeon: Molli Hazard, MD;  Location: [facility];  Service: Urology;  Laterality: N/A;  with PK Gyrus c-arm gyrus  digital ureteroscope     Current Outpatient Medications  Medication Sig Dispense Refill   acetaminophen (TYLENOL) 500 MG tablet Take 1,000 mg by mouth daily as needed for headache (pain).     aspirin 81 MG tablet Take 1 tablet (81 mg total) by mouth daily. 1 tablet 0   chlorthalidone (HYGROTON) 25 MG tablet Take 1 tablet (25 mg total) by mouth daily. 90 tablet 3   cloNIDine (CATAPRES - DOSED IN MG/24 HR) 0.2 mg/24hr patch Place 0.2 mg onto the skin every Monday.     hydrALAZINE (APRESOLINE) 25 MG tablet Take 1 tablet (25 mg total) by mouth 3 (three) times daily. 270 tablet 3   isosorbide mononitrate (IMDUR) 60 MG 24 hr tablet Take 1.5 tablets (90 mg total) by mouth daily. 90 tablet 3   rosuvastatin (CRESTOR) 5 MG tablet Take 1 tablet (5 mg total) by mouth daily. 90 tablet 1   telmisartan (MICARDIS) 80 MG tablet Take 1 tablet (80 mg total) by mouth daily. 90 tablet 1   Evolocumab (REPATHA SURECLICK) [id number] MG/ML SOAJ Inject 140 mg into the skin See admin instructions. Inject 140 mg subcutaneously on the 1st and 15th of each month 2 mL 11   nitroGLYCERIN  (NITROSTAT) 0.4 MG SL tablet Place 1 tablet (0.4 mg total) under the tongue every 5 (five) minutes as needed. 25 tablet 11   No current facility-administered medications for this visit.    Allergies:   Clonidine hcl, Cyclobenzaprine, and Norvasc [amlodipine besylate]    Social History:  The patient  reports that he quit smoking about 55 years ago. His smoking use included cigarettes. He has a 7.50 pack-year smoking history. He has never used smokeless tobacco. He reports that he does not drink alcohol and does not use drugs.   Family History:  The patient's family history includes Diabetes in his mother; Hypertension in his mother.    ROS:  Please see the history of present illness.   Otherwise, review of systems are positive for none.   All other systems are reviewed and negative.    PHYSICAL EXAM: VS:  BP 130/64 (BP Location: Right Arm)   Pulse 66   Ht '5\' 8"'$  (1.727 m)  Wt 179 lb 6.4 oz (81.4 kg)   SpO2 97%   BMI 27.28 kg/m  , BMI Body mass index is 27.28 kg/m. GEN: Well nourished, well developed, in no acute distress HEENT: sclera anicteric Neck: no JVD, carotid bruits, or masses Cardiac: RRR; no murmurs, rubs, or gallops,no edema  Respiratory:  clear to auscultation bilaterally, normal work of breathing GI: soft, nontender, nondistended, + BS MS: no deformity or atrophy Skin: warm and dry, no rash Neuro:  Strength and sensation are intact Psych: euthymic mood, full affect   EKG:  EKG is not ordered today.   Recent Labs: 05/27/2021: BUN 27; Creatinine, Ser 1.08; Hemoglobin 12.7; Magnesium 2.1; Platelets 115; Potassium 4.2; Sodium 140    Lipid Panel    Component Value Date/Time   CHOL 100 02/18/2021 1011   TRIG 78 02/18/2021 1011   HDL 50 02/18/2021 1011   CHOLHDL 2.0 02/18/2021 1011   CHOLHDL 4.2 07/09/2020 0757   VLDL 15 07/09/2020 0757   LDLCALC 34 02/18/2021 1011      Wt Readings from Last 3 Encounters:  07/15/21 179 lb 6.4 oz (81.4 kg)  05/27/21 175 lb  9.6 oz (79.7 kg)  02/26/21 169 lb 9.6 oz (76.9 kg)      Other studies Reviewed: Additional studies/ records that were reviewed today include:   Echo 05/12/21 1. Left ventricular ejection fraction, by estimation, is 60 to 65%. The  left ventricle has normal function. The left ventricle has no regional  wall motion abnormalities. There is mild concentric left ventricular  hypertrophy. Left ventricular diastolic  parameters are consistent with Grade I diastolic dysfunction (impaired  relaxation).   2. Right ventricular systolic function is normal. The right ventricular  size is normal. Tricuspid regurgitation signal is inadequate for assessing  PA pressure.   3. The mitral valve is grossly normal. Trivial mitral valve  regurgitation. No evidence of mitral stenosis.   4. The aortic valve is tricuspid. Aortic valve regurgitation is trivial.  No aortic stenosis is present.    Carotid USN  11/2020 Mild plaquing ([id number]) LICA, RICA     [place] AAA 02/2021 Summary: Abdominal Aorta: There is evidence of abnormal dilatation of the distal Abdominal aorta. There is evidence of abnormal dilation of the Right Common Iliac artery and Left External Iliac artery. The largest aortic diameter remains essentially unchanged compared to prior exam, today measures 3.8cm. Previous diameter measurement was 3.6 cm obtained on 09/15/2019.   TTE 07/08/2020  1. Left ventricular ejection fraction, by estimation, is 55 to 60%. The  left ventricle has normal function. The left ventricle has no regional  wall motion abnormalities. Left ventricular diastolic parameters are  consistent with Grade I diastolic  dysfunction (impaired relaxation).   2. Right ventricular systolic function is normal. The right ventricular  size is normal.   3. Left atrial size was mildly dilated.   4. The mitral valve is degenerative. Mild mitral valve regurgitation.   5. The aortic valve is tricuspid. Aortic valve regurgitation is trivial.   Mild aortic valve sclerosis is present, with no evidence of aortic valve  stenosis.   6. There is mild (Grade II) atheroma plaque involving the ascending  aorta.   7. The inferior vena cava is normal in size with greater than 50%  respiratory variability, suggesting right atrial pressure of 3 mmHg.   LHC 07/09/2020   Ost LAD to Prox LAD lesion is 50% stenosed. Mid LAD to Dist LAD lesion is 50% stenosed. Ost RCA lesion is  40% stenosed. Prox RCA lesion is 40% stenosed. Mid RCA lesion is 40% stenosed. 2nd Diag lesion is 50% stenosed. 2nd Mrg lesion is 60% stenosed. Mid Cx lesion is 30% stenosed.   Mild to moderate multivessel disease. Add isosorbide and ASA and Plavix for 6 months and life style modification.    Diagnostic Dominance: Right     ASSESSMENT AND PLAN:   1. Dizziness: overall improved with recent changes in medications. More concerning to him now is a flushing sensation which he has difficulty characterizing which is present throughout the day and has been present since discharge 04/2021. Discussed potential medication etiology with pharmacy. Pt did not have flushing when taking hydralazine '25mg'$  TID.  - Will trial hydralazine '25mg'$  TID and monitor for improvement - Will increase chlorthalidone to '25mg'$  daily to compensate - Will follow-up his cardiac monitor to evaluate for arrhythmias  2. CAD s/p stent to RCA in 2001: mild-moderate 3-vessel CAD on cath 06/2020. Had an isolated episode of chest pain 2 nights ago which lasted for ~30 minutes and ultimately resolved with taking SL nitro x1. This is worrisome to him and his family as he has only ever needed SL nitro 3 times in his life. He has not had any exertional chest pain recently.  - Will check a NST for peace of mind - Shared Decision Making/Informed Consent{ The risks [chest pain, shortness of breath, cardiac arrhythmias, dizziness, blood pressure fluctuations, myocardial infarction, stroke/transient ischemic attack,  nausea, vomiting, allergic reaction, radiation exposure, metallic taste sensation and life-threatening complications (estimated to be 1 in 10,000)], benefits (risk stratification, diagnosing coronary artery disease, treatment guidance) and alternatives of a nuclear stress test were discussed in detail with Mr. Hopps and he agrees to proceed.  3. HTN: BP 130/64 today - Will decrease hydralazine to '25mg'$  TID and increase chlorthalidone to '25mg'$  daily as noted above - Continue clonidine, telmesartan and imdur  4. HLD: LDL 34 02/2021 - Continue rosuvastatin and repatha  5. AAA: 3.8cm on last doppler 02/2021.  - Due for repeat study 02/2022 - this can be coordinated at a future visit   Current medicines are reviewed at length with the patient today.  The patient does not have concerns regarding medicines.  The following changes have been made:  As above  Labs/ tests ordered today include:   Orders Placed This Encounter  Procedures   Cardiac Stress Test: Informed Consent Details: Physician/Practitioner Attestation; Transcribe to consent form and obtain patient signature   MYOCARDIAL PERFUSION IMAGING   VAS US CAROTID     Disposition:   FU with myself or Laurann Montana, NP in 1 month  Signed, Abigail Butts, PA-C  07/15/2021 12:02 PM

## 2021-07-15 ENCOUNTER — Encounter: Payer: Self-pay | Admitting: Medical

## 2021-07-15 ENCOUNTER — Ambulatory Visit: Payer: Medicare Other | Admitting: Medical

## 2021-07-15 ENCOUNTER — Other Ambulatory Visit: Payer: Self-pay

## 2021-07-15 VITALS — BP 130/64 | HR 66 | Ht 68.0 in | Wt 179.4 lb

## 2021-07-15 DIAGNOSIS — E785 Hyperlipidemia, unspecified: Secondary | ICD-10-CM | POA: Diagnosis not present

## 2021-07-15 DIAGNOSIS — R42 Dizziness and giddiness: Secondary | ICD-10-CM | POA: Diagnosis not present

## 2021-07-15 DIAGNOSIS — I714 Abdominal aortic aneurysm, without rupture, unspecified: Secondary | ICD-10-CM

## 2021-07-15 DIAGNOSIS — R079 Chest pain, unspecified: Secondary | ICD-10-CM

## 2021-07-15 DIAGNOSIS — I25118 Atherosclerotic heart disease of native coronary artery with other forms of angina pectoris: Secondary | ICD-10-CM | POA: Diagnosis not present

## 2021-07-15 DIAGNOSIS — I10 Essential (primary) hypertension: Secondary | ICD-10-CM | POA: Diagnosis not present

## 2021-07-15 MED ORDER — REPATHA SURECLICK 140 MG/ML ~~LOC~~ SOAJ
140.0000 mg | SUBCUTANEOUS | 11 refills | Status: DC
Start: 2021-07-15 — End: 2023-12-01

## 2021-07-15 MED ORDER — HYDRALAZINE HCL 25 MG PO TABS: 25.0000 mg | ORAL_TABLET | Freq: Three times a day (TID) | ORAL | 3 refills | Status: DC

## 2021-07-15 MED ORDER — CHLORTHALIDONE 25 MG PO TABS: 25.0000 mg | ORAL_TABLET | Freq: Every day | ORAL | 3 refills | Status: DC

## 2021-07-15 MED ORDER — ISOSORBIDE MONONITRATE ER 60 MG PO TB24: 90.0000 mg | ORAL_TABLET | Freq: Every day | ORAL | 3 refills | Status: DC

## 2021-07-15 NOTE — Patient Instructions (Addendum)
Medication Instructions:  Start Hydralazine 25 mg (1 Tablet Three Times Daily). Start Clorthalidone 25 mg (1 Tablet Daily) . *If you need a refill on your cardiac medications before your next appointment, please call your pharmacy*   Lab Work: No Labs If you have labs (blood work) drawn today and your tests are completely normal, you will receive your results only by: [place] (if you have MyChart) OR A paper copy in the mail If you have any lab test that is abnormal or we need to change your treatment, we will call you to review the results.   Testing/Procedures: [address], [address]. Your physician has requested that you have a lexiscan myoview. For further information please visit [email or web address]. Please follow instruction sheet, as given.     Heart Care: [address], [address]. Your physician has requested that you have a carotid duplex. This test is an ultrasound of the carotid arteries in your neck. It looks at blood flow through these arteries that supply the brain with blood. Allow one hour for this exam. There are no restrictions or special instructions.   Follow-Up: At [facility], you and your health needs are our priority.  As part of our continuing mission to provide you with exceptional heart care, we have created designated Provider Care Teams.  These Care Teams include your primary Cardiologist (physician) and Advanced Practice Providers (APPs -  Physician Assistants and Nurse Practitioners) who all work together to provide you with the care you need, when you need it.   Your next appointment:   1 month(s)  The format for your next appointment:   In Person  Provider:   Laurann Montana, NP Roby Lofts, PA-C

## 2021-08-05 DIAGNOSIS — R42 Dizziness and giddiness: Secondary | ICD-10-CM | POA: Diagnosis not present

## 2021-08-15 ENCOUNTER — Telehealth (HOSPITAL_COMMUNITY): Payer: Self-pay | Admitting: *Deleted

## 2021-08-15 NOTE — Telephone Encounter (Signed)
Close encounter 

## 2021-08-16 ENCOUNTER — Other Ambulatory Visit: Payer: Self-pay

## 2021-08-16 ENCOUNTER — Ambulatory Visit (HOSPITAL_COMMUNITY)
Admission: RE | Admit: 2021-08-16 | Discharge: 2021-08-16 | Disposition: A | Discharge status: Home / Self Care | Payer: Medicare Other | Source: Ambulatory Visit | Attending: Cardiology | Admitting: Cardiology

## 2021-08-16 DIAGNOSIS — R079 Chest pain, unspecified: Secondary | ICD-10-CM | POA: Insufficient documentation

## 2021-08-16 LAB — MYOCARDIAL PERFUSION IMAGING
LV dias vol: 118 mL (ref 62–150)
LV sys vol: 60 mL
Nuc Stress EF: 49 %
Peak HR: 90 {beats}/min
Rest HR: 54 {beats}/min
Rest Nuclear Isotope Dose: 10.4 mCi
SDS: 1
SRS: 1
SSS: 2
ST Depression (mm): 0 mm
Stress Nuclear Isotope Dose: 31.1 mCi
TID: 1.01

## 2021-08-16 MED ORDER — TECHNETIUM TC 99M TETROFOSMIN IV KIT
10.4000 | PACK | Freq: Once | INTRAVENOUS | Status: AC | PRN
  Administered 2021-08-16: 10.4 via INTRAVENOUS
  Filled 2021-08-16: qty 11

## 2021-08-16 MED ORDER — REGADENOSON 0.4 MG/5ML IV SOLN
0.4000 mg | Freq: Once | INTRAVENOUS | Status: AC
  Administered 2021-08-16: 0.4 mg via INTRAVENOUS

## 2021-08-16 MED ORDER — TECHNETIUM TC 99M TETROFOSMIN IV KIT
31.1000 | PACK | Freq: Once | INTRAVENOUS | Status: AC | PRN
  Administered 2021-08-16: 31.1 via INTRAVENOUS
  Filled 2021-08-16: qty 32

## 2021-08-23 ENCOUNTER — Ambulatory Visit (HOSPITAL_BASED_OUTPATIENT_CLINIC_OR_DEPARTMENT_OTHER): Payer: Medicare Other | Admitting: Family

## 2021-08-30 ENCOUNTER — Ambulatory Visit (HOSPITAL_BASED_OUTPATIENT_CLINIC_OR_DEPARTMENT_OTHER): Payer: Medicare Other | Admitting: Family

## 2021-08-30 ENCOUNTER — Encounter (HOSPITAL_BASED_OUTPATIENT_CLINIC_OR_DEPARTMENT_OTHER): Payer: Self-pay | Admitting: Family

## 2021-08-30 ENCOUNTER — Other Ambulatory Visit: Payer: Self-pay

## 2021-08-30 VITALS — BP 126/66 | HR 60 | Ht 68.0 in | Wt 184.4 lb

## 2021-08-30 DIAGNOSIS — I10 Essential (primary) hypertension: Secondary | ICD-10-CM

## 2021-08-30 DIAGNOSIS — I714 Abdominal aortic aneurysm, without rupture, unspecified: Secondary | ICD-10-CM | POA: Diagnosis not present

## 2021-08-30 DIAGNOSIS — I25118 Atherosclerotic heart disease of native coronary artery with other forms of angina pectoris: Secondary | ICD-10-CM | POA: Diagnosis not present

## 2021-08-30 DIAGNOSIS — I951 Orthostatic hypotension: Secondary | ICD-10-CM | POA: Diagnosis not present

## 2021-08-30 DIAGNOSIS — R42 Dizziness and giddiness: Secondary | ICD-10-CM

## 2021-08-30 DIAGNOSIS — E785 Hyperlipidemia, unspecified: Secondary | ICD-10-CM | POA: Diagnosis not present

## 2021-08-30 MED ORDER — CHLORTHALIDONE 25 MG PO TABS: 12.5000 mg | ORAL_TABLET | Freq: Every day | ORAL | 3 refills | Status: DC

## 2021-08-30 NOTE — Patient Instructions (Addendum)
Medication Instructions:  Your physician has recommended you make the following change in your medication:  CHANGE Chlorthalidone to half tablet (12.35m) once per day  *If you need a refill on your cardiac medications before your next appointment, please call your pharmacy*   Lab Work: None ordered today.   Testing/Procedures: None ordered today.   Follow-Up: [facility] you and your health needs are our priority.  As part of our continuing mission to provide you with exceptional heart care, we have created designated Provider Care Teams.  These Care Teams include your primary Cardiologist (physician) and Advanced Practice Providers (APPs -  Physician Assistants and Nurse Practitioners) who all work together to provide you with the care you need, when you need it.  We recommend signing up for the patient portal called "MyChart".  Sign up information is provided on this After Visit Summary.  MyChart is used to connect with patients for Virtual Visits (Telemedicine).  Patients are able to view lab/test results, encounter notes, upcoming appointments, etc.  Non-urgent messages can be sent to your provider as well.   To learn more about what you can do with MyChart, go to [email or web address]    Your next appointment:   As scheduled with Dr. OAudie Box  Other Instructions   Heart Healthy Diet Recommendations: A low-salt diet is recommended. Meats should be grilled, baked, or boiled. Avoid fried foods. Focus on lean protein sources like fish or chicken with vegetables and fruits. The American Heart Association is a GMicrobiologist    Exercise recommendations: The American Heart Association recommends 150 minutes of moderate intensity exercise weekly. Try 30 minutes of moderate intensity exercise 4-5 times per week. This could include walking, jogging, or swimming.   Exercises to do While Sitting Exercises that you do while sitting (chair exercises) can give you many of the same  benefits as full exercise. Benefits include strengthening your heart, burning calories, and keeping muscles and joints healthy. Exercise can also improve your mood and help with depression and anxiety. You may benefit from chair exercises if you are unable to do standing exercises due to: Diabetic foot pain. Obesity. Illness. Arthritis. Recovery from surgery or injury. Breathing problems. Balance problems. Another type of disability. Before starting chair exercises, check with your health care provider or a physical therapist to find out how much exercise you can tolerate and which exercises are safe for you. If your health care provider approves: Start out slowly and build up over time. Aim to work up to about 10-20 minutes for each exercise session. Make exercise part of your daily routine. Drink water when you exercise. Do not wait until you are thirsty. Drink every 10-15 minutes. Stop exercising right away if you have pain, nausea, shortness of breath, or dizziness. If you are exercising in a wheelchair, make sure to lock the wheels. Ask your health care provider whether you can do tai chi or yoga. Many positions in these mind-body exercises can be modified to do while seated. Warm-up Before starting other exercises: Sit up as straight as you can. Have your knees bent at 90 degrees, which is the shape of the capital letter "L." Keep your feet flat on the floor. Sit at the front edge of your chair, if you can. Pull in (tighten) the muscles in your abdomen and stretch your spine and neck as straight as you can. Hold this position for a few minutes. Breathe in and out evenly. Try to concentrate on your breathing, and relax your  mind. Stretching Exercise A: Arm stretch Hold your arms out straight in front of your body. Bend your hands at the wrist with your fingers pointing up, as if signaling someone to stop. Notice the slight tension in your forearms as you hold the position. Keeping  your arms out and your hands bent, rotate your hands outward as far as you can and hold this stretch. Aim to have your thumbs pointing up and your pinkie fingers pointing down. Slowly repeat arm stretches for one minute as tolerated. Exercise B: Leg stretch If you can move your legs, try to "draw" letters on the floor with the toes of your foot. Write your name with one foot. Write your name with the toes of your other foot. Slowly repeat the movements for one minute as tolerated. Exercise C: Reach for the sky Reach your hands as far over your head as you can to stretch your spine. Move your hands and arms as if you are climbing a rope. Slowly repeat the movements for one minute as tolerated. Range of motion exercises Exercise A: Shoulder roll Let your arms hang loosely at your sides. Lift just your shoulders up toward your ears, then let them relax back down. When your shoulders feel loose, rotate your shoulders in backward and forward circles. Do shoulder rolls slowly for one minute as tolerated. Exercise B: March in place As if you are marching, pump your arms and lift your legs up and down. Lift your knees as high as you can. If you are unable to lift your knees, just pump your arms and move your ankles and feet up and down. March in place for one minute as tolerated. Exercise C: Seated jumping jacks Let your arms hang down straight. Keeping your arms straight, lift them up over your head. Aim to point your fingers to the ceiling. While you lift your arms, straighten your legs and slide your heels along the floor to your sides, as wide as you can. As you bring your arms back down to your sides, slide your legs back together. If you are unable to use your legs, just move your arms. Slowly repeat seated jumping jacks for one minute as tolerated. Strengthening exercises Exercise A: Shoulder squeeze Hold your arms straight out from your body to your sides, with your elbows bent and your  fists pointed at the ceiling. Keeping your arms in the bent position, move them forward so your elbows and forearms meet in front of your face. Open your arms back out as wide as you can with your elbows still bent, until you feel your shoulder blades squeezing together. Hold for 5 seconds. Slowly repeat the movements forward and backward for one minute as tolerated. Contact a health care provider if: You have to stop exercising due to any of the following: Pain. Nausea. Shortness of breath. Dizziness. Fatigue. You have significant pain or soreness after exercising. Get help right away if: You have chest pain. You have difficulty breathing. These symptoms may represent a serious problem that is an emergency. Do not wait to see if the symptoms will go away. Get medical help right away. Call your local emergency services (911 in the [place] Do not drive yourself to the hospital. Summary Exercises that you do while sitting (chair exercises) can strengthen your heart, burn calories, and keep muscles and joints healthy. You may benefit from chair exercises if you are unable to do standing exercises due to diabetic foot pain, obesity, recovery from surgery or injury,  or other conditions. Before starting chair exercises, check with your health care provider or a physical therapist to find out how much exercise you can tolerate and which exercises are safe for you. This information is not intended to replace advice given to you by your health care provider. Make sure you discuss any questions you have with your health care provider. Document Revised: 12/30/2020 Document Reviewed: 12/30/2020 Elsevier Patient Education  2022 [employer].

## 2021-08-31 NOTE — Progress Notes (Signed)
Office Visit    Patient Name: Ricardo [name] Date of Encounter: 08/31/2021  PCP:  Maury Dus, MD   [place]  Cardiologist:  Evalina Field, MD  Advanced Practice Provider:  No care team member to display Electrophysiologist:  None    Chief Complaint    Ricardo [name] is a 80 y.o. 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 Doylene Canard   Coronary artery disease CARDIOLOGIST- DR Wynonia Lawman-  LAST VISIT 2 WKS AGO-- WILL REQUEST NOTE AND EKG   STRESS TEST -- JULY 2009   Essential hypertension 08/30/2018   Hyperlipidemia    Hypertension    Nocturia    Personal history of malignant neoplasm of bladder 08/30/2018   Post PTCA 2001-   X1 STENT TO RCA   AND 1996   Past Surgical History:  Procedure Laterality Date   CORONARY ANGIOPLASTY  1996   CORONARY ANGIOPLASTY WITH STENT PLACEMENT  2001   STENT TO RCA   CYSTO/ BLADDER BX  10-22-10   CYSTOSCOPY W/ RETROGRADES  11/12/2011   Procedure: CYSTOSCOPY WITH RETROGRADE PYELOGRAM;  Surgeon: Molli Hazard, MD;  Location: [facility];  Service: Urology;  Laterality: Bilateral;   LEFT HEART CATH AND CORONARY ANGIOGRAPHY N/A 07/09/2020   Procedure: LEFT HEART CATH AND CORONARY ANGIOGRAPHY;  Surgeon: Dixie Dials, MD;  Location: [facility];  Service: Cardiovascular;  Laterality: N/A;   LUMBAR FUSION     TRANSURETHRAL RESECTION OF BLADDER TUMOR   08-27-10   RIGHT ANTERIOR DOME   TRANSURETHRAL RESECTION OF BLADDER TUMOR  11/12/2011   Procedure: TRANSURETHRAL RESECTION OF BLADDER TUMOR (TURBT);  Surgeon: Molli Hazard, MD;  Location: [facility];  Service: Urology;  Laterality: N/A;  with PK Gyrus c-arm gyrus  digital ureteroscope    Allergies  Allergies  Allergen Reactions   Clonidine Hcl Other (See Comments)    Reaction to 0.2 mg tabs - caused dry mouth, drowsiness, lightheadedness - reported 07/08/2020 (no reaction to patch)   Cyclobenzaprine Other (See Comments)    Numbness in lips   Norvasc [Amlodipine Besylate] Other (See Comments)    CAUSES TEETH TO FALL OUT    History of Present Illness    Ricardo [name] is a 80 y.o. male with a hx of CAD, hyperlipidemia, hypertension, AAA, orthostatic hypotension, carotid artery disease last seen 07/15/21 by Roby Lofts, PA.  Prior RCA stent in 2001.  He was admitted August 2021 for unstable angina.  He was found to have mild to moderate nonobstructive CAD.  Seen 02/2021 by Dr. Audie Box with BP mildly elevated and AAA measuring 3.8 cm (3.6 in 2020) with recommendation for repeat ultrasound in 1 year.  He was admitted 05/11/21-05/12/21 due to chest pain, hypertensive emergency, bradycardia. Echo with normal LVEF, gr1DD, no RWMA. His Hydralazine was increased to 50mg  TID. Chest pain presumed due to  hypertension. Bradycardia resolved.  Seen 05/2021 in follow up noting significant lightheadedness and dizziness. Blood pressure was labile at home  86/41 - 150s/90s and timing of medications were changed. Via MyChart message Imdur was increased to 90mg  QD for increased BP control and ZIO ordered. ZIO showed predominanlty NSR with average HR of 63 bpm, 12 SVT runs longest 13 seconds which were asymptomatic, and rare PAC/PVC. Triggered episodes were associated with sinus rhythm.   Seen 06/2021 in follow up by Roby Lofts, PA noting episodes of chest pain, flushing sensation,  and occasional orthostatic symptoms. His Hydralazine was reduced to 25mg  TID as patient reported no flushing on that dose. Chlorthalidone increased to 25mg  QD. Lexiscan ordered which was low risk study with no ischemia.   He presents today for follow up. Notes lightheadedness and dizziness is mildly improved but still present. BP at home has been well controlled. Only drinking 32 oz of fluid per day and encouraged to increase. Lightheadedness most notable with quick movements such as turning his head to talk to his wife. Reports no shortness of breath nor dyspnea on exertion. Reports no chest pain, pressure, or tightness. No edema, orthopnea, PND. Reports no palpitations.    EKGs/Labs/Other Studies Reviewed:   The following studies were reviewed today:  Lexiscan Myoview 07/2021   Findings are consistent with no prior ischemia and no prior myocardial infarction. The study is intermediate risk due to reduced systolic function.   No ST deviation was noted.   LV perfusion is normal. There is no evidence of ischemia. There is no evidence of infarction.   Left ventricular function is abnormal. Global function is mildly reduced. There were no regional wall motion abnormalities. Nuclear stress EF: 49 %. The left ventricular ejection fraction is mildly decreased (45-54%). End diastolic cavity size is normal. End systolic cavity size is normal.   Prior study not available for comparison.   Negative stress test.    Long term monitor 07/2021 Enrollment 07/12/2021-07/26/2021. Patient had a min HR of 38 bpm (sinus bradycardia), max HR of 171 bpm (supraventricular tachycardia, 2.6 second duration), and avg HR of 63 bpm (normal sinus rhythm). Predominant underlying rhythm was Sinus Rhythm. 12 Supraventricular Tachycardia runs occurred, the run with the fastest interval lasting 5 beats (2.6 second duration) with a max rate of 171 bpm, the longest lasting 13.0 secs (25 beats) with an avg rate of 119 bpm. Isolated SVEs  were rare (<1.0%), SVE Couplets were rare (<1.0%), and SVE Triplets were rare (<1.0%). Isolated VEs were rare (<1.0%), VE Couplets were rare (<1.0%), and no VE Triplets were present. Ventricular Bigeminy was present.   07/12/21 09:17pm lightheaded coincided with normal sinus rhythm 64 bpm.  07/13/21 03:44pm Hot flash/flushed coincided with normal sinus rhythm 67 bpm.  07/14/21 12:09am chest pain/pressure coincided with normal sinus rhythm 68 bpm.  07/14/21 12:21am left face pain coincided with normal sinus rhythm 64 bpm.  07/15/21 09:00am head throbbing, pounding coincided with normal sinus rhythm 74 bpm.    Impression: 1. No significant arrhythmias.  2. Rare ectopy.   Echo 05/12/21 1. Left ventricular ejection fraction, by estimation, is 60 to 65%. The  left ventricle has normal function. The left ventricle has no regional  wall motion abnormalities. There is mild concentric left ventricular  hypertrophy. Left ventricular diastolic  parameters are consistent with Grade I diastolic dysfunction (impaired  relaxation).   2. Right ventricular systolic function is normal. The right ventricular  size is normal. Tricuspid regurgitation signal is inadequate for assessing  PA pressure.   3. The mitral valve is grossly normal. Trivial mitral valve  regurgitation. No evidence of mitral stenosis.   4. The aortic valve is tricuspid. Aortic valve regurgitation is trivial.  No aortic stenosis is present.   Carotid USN  11/2020 Mild plaquing (4-09%) LICA, RICA     [place] AAA 02/2021 Summary: Abdominal Aorta: There is evidence of abnormal dilatation of the distal Abdominal aorta. There is evidence of abnormal dilation of the Right Common Iliac artery and Left External Iliac artery. The largest aortic diameter remains essentially unchanged compared to prior exam, today measures 3.8cm. Previous diameter measurement was 3.6 cm obtained on 09/15/2019.   TTE 07/08/2020  1. Left ventricular ejection  fraction, by estimation, is 55 to 60%. The  left ventricle has normal function. The left ventricle has no regional  wall motion abnormalities. Left ventricular diastolic parameters are  consistent with Grade I diastolic  dysfunction (impaired relaxation).   2. Right ventricular systolic function is normal. The right ventricular  size is normal.   3. Left atrial size was mildly dilated.   4. The mitral valve is degenerative. Mild mitral valve regurgitation.   5. The aortic valve is tricuspid. Aortic valve regurgitation is trivial.  Mild aortic valve sclerosis is present, with no evidence of aortic valve  stenosis.   6. There is mild (Grade II) atheroma plaque involving the ascending  aorta.   7. The inferior vena cava is normal in size with greater than 50%  respiratory variability, suggesting right atrial pressure of 3 mmHg.   LHC 07/09/2020   Ost LAD to Prox LAD lesion is 50% stenosed. Mid LAD to Dist LAD lesion is 50% stenosed. Ost RCA lesion is 40% stenosed. Prox RCA lesion is 40% stenosed. Mid RCA lesion is 40% stenosed. 2nd Diag lesion is 50% stenosed. 2nd Mrg lesion is 60% stenosed. Mid Cx lesion is 30% stenosed.   Mild to moderate multivessel disease. Add isosorbide and ASA and Plavix for 6 months and life style modification.    EKG:  No EKG today  Recent Labs: 05/27/2021: BUN 27; Creatinine, Ser 1.08; Hemoglobin 12.7; Magnesium 2.1; Platelets 115; Potassium 4.2; Sodium 140  Recent Lipid Panel    Component Value Date/Time   CHOL 100 02/18/2021 1011   TRIG 78 02/18/2021 1011   HDL 50 02/18/2021 1011   CHOLHDL 2.0 02/18/2021 1011   CHOLHDL 4.2 07/09/2020 0757   VLDL 15 07/09/2020 0757   LDLCALC 34 02/18/2021 1011    Home Medications   Current Meds  Medication Sig   acetaminophen (TYLENOL) 500 MG tablet Take 1,000 mg by mouth daily as needed for headache (pain).   aspirin 81 MG tablet Take 1 tablet (81 mg total) by mouth daily.   chlorthalidone (HYGROTON) 25 MG  tablet Take 0.5 tablets (12.5 mg total) by mouth daily.   cloNIDine (CATAPRES - DOSED IN MG/24 HR) 0.2 mg/24hr patch Place 0.2 mg onto the skin every Monday.   Evolocumab (REPATHA SURECLICK) 811 MG/ML SOAJ Inject 140 mg into the skin See admin instructions. Inject 140 mg subcutaneously on the 1st and 15th of each month   hydrALAZINE (APRESOLINE) 25 MG tablet Take 1 tablet (25 mg total) by mouth 3 (three) times daily.   isosorbide mononitrate (IMDUR) 60 MG 24 hr tablet Take 1.5 tablets (90 mg total) by mouth daily.   rosuvastatin (CRESTOR) 5 MG tablet Take 1 tablet (5 mg total) by mouth daily.   telmisartan (MICARDIS) 80 MG tablet Take 1 tablet (  80 mg total) by mouth daily.     Review of Systems      All other systems reviewed and are otherwise negative except as noted above.  Physical Exam    VS:  BP 126/66   Pulse 60   Ht 5\' 8"  (1.727 m)   Wt 184 lb 6.4 oz (83.6 kg)   SpO2 96%   BMI 28.04 kg/m  , BMI Body mass index is 28.04 kg/m.  Wt Readings from Last 3 Encounters:  08/30/21 184 lb 6.4 oz (83.6 kg)  08/16/21 179 lb (81.2 kg)  07/15/21 179 lb 6.4 oz (81.4 kg)    GEN: Well nourished, well developed, in no acute distress. HEENT: normal. Neck: Supple, no JVD, carotid bruits, or masses. Cardiac: RRR, no murmurs, rubs, or gallops. No clubbing, cyanosis, edema.  Radials/PT 2+ and equal bilaterally.  Respiratory:  Respirations regular and unlabored, clear to auscultation bilaterally. GI: Soft, nontender, nondistended. MS: No deformity or atrophy. Skin: Warm and dry, no rash. Neuro:  Strength and sensation are intact. Psych: Normal affect.  Assessment & Plan    CAD - Lexiscan myoview 08/16/21 with no ischemic. No recurrent chest pain. No further evaluation recommended.  GDMT includes aspirin, imdur, rosuvastatin, repatha, PRN nitroglycerin. Heart healthy diet and regular cardiovascular exercise encouraged.    HTN /orthostatic hypotension/lightheadedness /dizziness - Echo 04/2021  no significant valvular abnormalities. Monitor 08/05/21 with dizzy episodes correlating with NSR. Reports lightheadedness and dizziness are improved. Only drinking 32 oz of fluid per day and educated to increase. BP at home well controlled. Given concern for Chlorthalidone as contributing to orthostasis, will reduce to Chlorthalidone 12.5mg  QD. He will check in via MyChart in 1-2 weeks to tell us if symptoms improved. If not, may consider referral to ENT or neurology for persistent dizziness.   HLD -02/18/2021 LDL 34.  Continue Repatha, Rosuvastatin.   AAA (2022 3.8cm) -continue optimal blood pressure control.  Plan for repeat AAA duplex 02/2022 for monitoring which can be coordinated at future visit.   Carotid artery disease - carotid duplex 10/2020 bilateral 1-39% stenosis. Repeat duplex ordered for 11/2021. No amaurosis fugax.    Disposition: Follow up in 1 month(s) with Dr. Audie Box as scheduled.  Signed, Loel Dubonnet, NP 08/31/2021, 8:03 AM [place]

## 2021-09-14 ENCOUNTER — Other Ambulatory Visit: Payer: Self-pay | Admitting: Cardiovascular Disease

## 2021-09-16 ENCOUNTER — Encounter (HOSPITAL_BASED_OUTPATIENT_CLINIC_OR_DEPARTMENT_OTHER): Payer: Self-pay

## 2021-09-16 DIAGNOSIS — N3000 Acute cystitis without hematuria: Secondary | ICD-10-CM | POA: Diagnosis not present

## 2021-09-16 DIAGNOSIS — C678 Malignant neoplasm of overlapping sites of bladder: Secondary | ICD-10-CM | POA: Diagnosis not present

## 2021-09-27 NOTE — Progress Notes (Signed)
Cardiology Office Note:   Date:  09/30/2021  NAME:  Ricardo [name]    MRN: [id number] DOB:  1941-06-12   PCP:  Maury Dus, MD  Cardiologist:  Evalina Field, MD  Electrophysiologist:  None   Referring MD: Maury Dus, MD   Chief Complaint  Patient presents with   Follow-up         History of Present Illness:   Ricardo [name] is a 80 y.o. male with a hx of CAD, AAA, hypertension, lipidemia who presents for follow-up.  Has been seen for dizziness attributed to orthostasis.  Nuclear medicine stress test recently was normal.  AAA scan shows stability at 3.8 cm.  He presents with his daughter.  Reports he is doing well.  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  Denies any chest pain or trouble breathing.  Can get dizzy upon standing.  He is careful with this.  Blood pressure 160/86.  Apparently he was diagnosed with a urinary prostate infection.  He is on Flomax, ciprofloxacin as well as meloxicam.  Kidney function appears to be stable.  No significant dysuria or symptoms significant for prostate infection.  He reports he was taking a nitric oxide supplement.  Blood pressure was much improved.  I informed him that it is okay to take this medicine.  There is nothing that would be a contraindication from a cardiovascular standpoint.  Most recent cholesterol level is at goal.  Denies any significant symptoms concerning for angina in office today.  Problem List 1. Moderate CAD -Prior RCA stent 2001 -50% LAD -40% RCA -60% OM2 2. AAA -3.6 cm 2020 -3.8 cm 2022 3. Carotid artery disease  -R ICA 1-39% -L ICA 1-39% 4. HLD -T chol 100, HDL 50, LDL 34, TG 78 5. HTN  Past Medical History: Past Medical History:  Diagnosis Date   Abdominal aneurysm 08/30/2018   Arthritis    SHOULDERS, NECK , RIGHT HIP   Bladder cancer ([place]) DX   OCT 2011--  FOLLOWED BY DR WOODRUFF   S/P TURBT,  HIGH GRADE BLADDER CANCER   CAD (coronary artery disease), native coronary artery 08/30/2018   normal Left main,  30% stenosis ostial LAD, 50% stenosis OM 3, 80% stenosis mid RCA;  2.5 x 8 mm Tetra Stent Dr. Doylene Canard   Coronary artery disease CARDIOLOGIST- DR Wynonia Lawman-  LAST VISIT 2 WKS AGO-- WILL REQUEST NOTE AND EKG   STRESS TEST -- JULY 2009   Essential hypertension 08/30/2018   Hyperlipidemia    Hypertension    Nocturia    Personal history of malignant neoplasm of bladder 08/30/2018   Post PTCA 2001-   X1 STENT TO RCA   AND 1996    Past Surgical History: Past Surgical History:  Procedure Laterality Date   CORONARY ANGIOPLASTY  1996   CORONARY ANGIOPLASTY WITH STENT PLACEMENT  2001   STENT TO RCA   CYSTO/ BLADDER BX  10-22-10   CYSTOSCOPY W/ RETROGRADES  11/12/2011   Procedure: CYSTOSCOPY WITH RETROGRADE PYELOGRAM;  Surgeon: Molli Hazard, MD;  Location: [facility];  Service: Urology;  Laterality: Bilateral;   LEFT HEART CATH AND CORONARY ANGIOGRAPHY N/A 07/09/2020   Procedure: LEFT HEART CATH AND CORONARY ANGIOGRAPHY;  Surgeon: Dixie Dials, MD;  Location: [facility];  Service: Cardiovascular;  Laterality: N/A;   LUMBAR FUSION     TRANSURETHRAL RESECTION OF BLADDER TUMOR  08-27-10   RIGHT ANTERIOR DOME   TRANSURETHRAL RESECTION OF BLADDER TUMOR  11/12/2011   Procedure:  TRANSURETHRAL RESECTION OF BLADDER TUMOR (TURBT);  Surgeon: Molli Hazard, MD;  Location: [facility];  Service: Urology;  Laterality: N/A;  with PK Gyrus c-arm gyrus  digital ureteroscope    Current Medications: Current Meds  Medication Sig   acetaminophen (TYLENOL) 500 MG tablet Take 1,000 mg by mouth daily as needed for headache (pain).   aspirin 81 MG tablet Take 1 tablet (81 mg total) by mouth daily.   ciprofloxacin (CIPRO) 500 MG tablet Take 500 mg by mouth 2 (two) times daily.   cloNIDine (CATAPRES - DOSED IN MG/24 HR) 0.2 mg/24hr patch Place 0.2 mg onto the skin every Monday.   Evolocumab (REPATHA SURECLICK) 810 MG/ML SOAJ Inject 140 mg into the skin See admin  instructions. Inject 140 mg subcutaneously on the 1st and 15th of each month   meloxicam (MOBIC) 15 MG tablet Take 15 mg by mouth daily.   tamsulosin (FLOMAX) 0.4 MG CAPS capsule Take 0.4 mg by mouth.   [DISCONTINUED] chlorthalidone (HYGROTON) 25 MG tablet Take 0.5 tablets (12.5 mg total) by mouth daily.   [DISCONTINUED] hydrALAZINE (APRESOLINE) 25 MG tablet Take 1 tablet (25 mg total) by mouth 3 (three) times daily.   [DISCONTINUED] isosorbide mononitrate (IMDUR) 60 MG 24 hr tablet Take 1.5 tablets (90 mg total) by mouth daily.   [DISCONTINUED] rosuvastatin (CRESTOR) 5 MG tablet Take 1 tablet (5 mg total) by mouth daily.   [DISCONTINUED] telmisartan (MICARDIS) 80 MG tablet TAKE ONE TABLET BY MOUTH DAILY     Allergies:    Clonidine hcl, Cyclobenzaprine, and Norvasc [amlodipine besylate]   Social History: Social History   Socioeconomic History   Marital status: Married    Spouse name: Not on file   Number of children: 4   Years of education: Not on file   Highest education level: Not on file  Occupational History   Occupation: retired - Probation officer  Tobacco Use   Smoking status: Former    Packs/day: 1.50    Years: 5.00    Pack years: 7.50    Types: Cigarettes    Quit date: 11/05/1965    Years since quitting: 55.9   Smokeless tobacco: Never  Substance and Sexual Activity   Alcohol use: No   Drug use: No   Sexual activity: Not on file  Other Topics Concern   Not on file  Social History Narrative   Not on file   Social Determinants of Health   Financial Resource Strain: Not on file  Food Insecurity: Not on file  Transportation Needs: Not on file  Physical Activity: Not on file  Stress: Not on file  Social Connections: Not on file     Family History: The patient's family history includes Diabetes in his mother; Hypertension in his mother.  ROS:   All other ROS reviewed and negative. Pertinent positives noted in the HPI.     EKGs/Labs/Other Studies Reviewed:    The following studies were personally reviewed by me today:  [facility] 07/09/2020 Ost LAD to Prox LAD lesion is 50% stenosed. Mid LAD to Dist LAD lesion is 50% stenosed. Ost RCA lesion is 40% stenosed. Prox RCA lesion is 40% stenosed. Mid RCA lesion is 40% stenosed. 2nd Diag lesion is 50% stenosed. 2nd Mrg lesion is 60% stenosed. Mid Cx lesion is 30% stenosed.   Mild to moderate multivessel disease. Add isosorbide and ASA and Plavix for 6 months and life style modification.  Recent Labs: 05/27/2021: BUN 27; Creatinine, Ser 1.08; Hemoglobin 12.7; Magnesium 2.1;  Platelets 115; Potassium 4.2; Sodium 140   Recent Lipid Panel    Component Value Date/Time   CHOL 100 02/18/2021 1011   TRIG 78 02/18/2021 1011   HDL 50 02/18/2021 1011   CHOLHDL 2.0 02/18/2021 1011   CHOLHDL 4.2 07/09/2020 0757   VLDL 15 07/09/2020 0757   LDLCALC 34 02/18/2021 1011    Physical Exam:   VS:  BP (!) 160/86 (BP Location: Left Arm, Patient Position: Sitting, Cuff Size: Normal)   Pulse 86   Resp 20   Ht 5\' 8"  (1.727 m)   Wt 186 lb (84.4 kg)   SpO2 97%   BMI 28.28 kg/m    Wt Readings from Last 3 Encounters:  09/30/21 186 lb (84.4 kg)  08/30/21 184 lb 6.4 oz (83.6 kg)  08/16/21 179 lb (81.2 kg)    General: Well nourished, well developed, in no acute distress Head: Atraumatic, normal size  Eyes: PEERLA, EOMI  Neck: Supple, no JVD Endocrine: No thryomegaly Cardiac: Normal S1, S2; RRR; no murmurs, rubs, or gallops Lungs: Clear to auscultation bilaterally, no wheezing, rhonchi or rales  Abd: Soft, nontender, no hepatomegaly  Ext: No edema, pulses 2+ Musculoskeletal: No deformities, BUE and BLE strength normal and equal Skin: Warm and dry, no rashes   Neuro: Alert and oriented to person, place, time, and situation, CNII-XII grossly intact, no focal deficits  Psych: Normal mood and affect   ASSESSMENT:   Ricardo [name] is a 80 y.o. male who presents for the following: 1. Coronary artery disease of  native artery of native heart with stable angina pectoris ([place])   2. Hyperlipidemia LDL goal <70   3. Essential hypertension   4. Abdominal aortic aneurysm (AAA) without rupture, unspecified part   5. Orthostatic hypotension     PLAN:   1. Coronary artery disease of native artery of native heart with stable angina pectoris ([place]) 2. Hyperlipidemia LDL goal <70 -Left heart catheterization demonstrated diffuse moderate CAD.  Patent RCA stent.  Recent nuclear medicine stress test is normal.  Ejection fraction is normal.  No symptoms of chest pain or trouble breathing.  Would recommend to continue with aggressive medical management.  He is on aspirin 81 mg daily.  He is on Repatha as well as Crestor.  He cannot take high intensity statins due to cramping.  Nonetheless his numbers are at goal on Repatha.  No beta-blocker due to bradycardia.  He is on Imdur 90 mg daily.  Well-controlled.  3. Essential hypertension -BP is elevated today.  He is on chlorthalidone 12.5 mg daily, clonidine patch 0.2 mg weekly, hydralazine 25 mg 3 times daily, Imdur 90 mg daily, telmisartan 80 mg daily.  Blood pressure is elevated today and he reports he takes a nitric oxide supplement.  Apparently blood pressure values were better controlled when on this medication.  I think it is reasonable to continue.  He also is on medications for a prostate infection which includes ciprofloxacin, Flomax and meloxicam.  I did inform him that NSAIDs could raise his blood pressure.  It is really unclear what is caused his change in blood pressure or if the nitric oxide supplement was helping.  Regardless he can continue to take the supplement.  I would like for him to check his blood pressure daily.  We need to keep a close eye on things.  4. Abdominal aortic aneurysm (AAA) without rupture, unspecified part -3.8 cm on ultrasound earlier this year.  We will plan for yearly evaluation of this. -  He also has minimal carotid artery disease.  We  will continue with yearly evaluation of this.  5. Orthostatic hypotension -Informed him to continue wearing compression stockings as well as to modify how he stands up and changes position.  He should do this slowly.  Disposition: Return in about 6 months (around 03/30/2022).  Medication Adjustments/Labs and Tests Ordered: Current medicines are reviewed at length with the patient today.  Concerns regarding medicines are outlined above.  No orders of the defined types were placed in this encounter.  Meds ordered this encounter  Medications   rosuvastatin (CRESTOR) 5 MG tablet    Sig: Take 1 tablet (5 mg total) by mouth daily.    Dispense:  90 tablet    Refill:  1   isosorbide mononitrate (IMDUR) 60 MG 24 hr tablet    Sig: Take 1.5 tablets (90 mg total) by mouth daily.    Dispense:  90 tablet    Refill:  3   hydrALAZINE (APRESOLINE) 25 MG tablet    Sig: Take 1 tablet (25 mg total) by mouth 3 (three) times daily.    Dispense:  270 tablet    Refill:  3   chlorthalidone (HYGROTON) 25 MG tablet    Sig: Take 0.5 tablets (12.5 mg total) by mouth daily.    Dispense:  45 tablet    Refill:  3   telmisartan (MICARDIS) 80 MG tablet    Sig: Take 1 tablet (80 mg total) by mouth daily.    Dispense:  90 tablet    Refill:  1   nitroGLYCERIN (NITROSTAT) 0.4 MG SL tablet    Sig: Place 1 tablet (0.4 mg total) under the tongue every 5 (five) minutes as needed.    Dispense:  25 tablet    Refill:  3     Patient Instructions  Medication Instructions:  The current medical regimen is effective;  continue present plan and medications.  *If you need a refill on your cardiac medications before your next appointment, please call your pharmacy*   Follow-Up: At [facility], you and your health needs are our priority.  As part of our continuing mission to provide you with exceptional heart care, we have created designated Provider Care Teams.  These Care Teams include your primary Cardiologist  (physician) and Advanced Practice Providers (APPs -  Physician Assistants and Nurse Practitioners) who all work together to provide you with the care you need, when you need it.  We recommend signing up for the patient portal called "MyChart".  Sign up information is provided on this After Visit Summary.  MyChart is used to connect with patients for Virtual Visits (Telemedicine).  Patients are able to view lab/test results, encounter notes, upcoming appointments, etc.  Non-urgent messages can be sent to your provider as well.   To learn more about what you can do with MyChart, go to [email or web address].    Your next appointment:   6 month(s)  The format for your next appointment:   In Person  Provider:   Evalina Field, MD     Other Instructions BP readings daily- use log provided.     Time Spent with Patient: I have spent a total of 35 minutes with patient reviewing hospital notes, telemetry, EKGs, labs and examining the patient as well as establishing an assessment and plan that was discussed with the patient.  > 50% of time was spent in direct patient care.  Signed, Addison Naegeli. Audie Box, MD, [place]  [address] [place], [place] [address] [phone]  09/30/2021 11:50 AM

## 2021-09-30 ENCOUNTER — Encounter: Payer: Self-pay | Admitting: Cardiovascular Disease

## 2021-09-30 ENCOUNTER — Other Ambulatory Visit: Payer: Self-pay

## 2021-09-30 ENCOUNTER — Ambulatory Visit: Payer: Medicare Other | Admitting: Cardiovascular Disease

## 2021-09-30 VITALS — BP 160/86 | HR 86 | Resp 20 | Ht 68.0 in | Wt 186.0 lb

## 2021-09-30 DIAGNOSIS — I951 Orthostatic hypotension: Secondary | ICD-10-CM

## 2021-09-30 DIAGNOSIS — I10 Essential (primary) hypertension: Secondary | ICD-10-CM | POA: Diagnosis not present

## 2021-09-30 DIAGNOSIS — I714 Abdominal aortic aneurysm, without rupture, unspecified: Secondary | ICD-10-CM | POA: Diagnosis not present

## 2021-09-30 DIAGNOSIS — E785 Hyperlipidemia, unspecified: Secondary | ICD-10-CM | POA: Diagnosis not present

## 2021-09-30 DIAGNOSIS — I25118 Atherosclerotic heart disease of native coronary artery with other forms of angina pectoris: Secondary | ICD-10-CM | POA: Diagnosis not present

## 2021-09-30 MED ORDER — HYDRALAZINE HCL 25 MG PO TABS: 25.0000 mg | ORAL_TABLET | Freq: Three times a day (TID) | ORAL | 3 refills | Status: DC

## 2021-09-30 MED ORDER — CHLORTHALIDONE 25 MG PO TABS: 12.5000 mg | ORAL_TABLET | Freq: Every day | ORAL | 3 refills | Status: DC

## 2021-09-30 MED ORDER — ROSUVASTATIN CALCIUM 5 MG PO TABS: 5.0000 mg | ORAL_TABLET | Freq: Every day | ORAL | 1 refills | Status: DC

## 2021-09-30 MED ORDER — ISOSORBIDE MONONITRATE ER 60 MG PO TB24
90.0000 mg | ORAL_TABLET | Freq: Every day | ORAL | 3 refills | Status: DC
Start: 2021-09-30 — End: 2022-05-23

## 2021-09-30 MED ORDER — TELMISARTAN 80 MG PO TABS: 80.0000 mg | ORAL_TABLET | Freq: Every day | ORAL | 1 refills | Status: DC

## 2021-09-30 MED ORDER — NITROGLYCERIN 0.4 MG SL SUBL: 0.4000 mg | SUBLINGUAL_TABLET | SUBLINGUAL | 3 refills | Status: DC | PRN

## 2021-09-30 NOTE — Patient Instructions (Signed)
Medication Instructions:  The current medical regimen is effective;  continue present plan and medications.  *If you need a refill on your cardiac medications before your next appointment, please call your pharmacy*   Follow-Up: At [facility], you and your health needs are our priority.  As part of our continuing mission to provide you with exceptional heart care, we have created designated Provider Care Teams.  These Care Teams include your primary Cardiologist (physician) and Advanced Practice Providers (APPs -  Physician Assistants and Nurse Practitioners) who all work together to provide you with the care you need, when you need it.  We recommend signing up for the patient portal called "MyChart".  Sign up information is provided on this After Visit Summary.  MyChart is used to connect with patients for Virtual Visits (Telemedicine).  Patients are able to view lab/test results, encounter notes, upcoming appointments, etc.  Non-urgent messages can be sent to your provider as well.   To learn more about what you can do with MyChart, go to [email or web address].    Your next appointment:   6 month(s)  The format for your next appointment:   In Person  Provider:   Evalina Field, MD     Other Instructions BP readings daily- use log provided.

## 2021-10-02 DIAGNOSIS — G8929 Other chronic pain: Secondary | ICD-10-CM | POA: Diagnosis not present

## 2021-10-02 DIAGNOSIS — M25562 Pain in left knee: Secondary | ICD-10-CM | POA: Diagnosis not present

## 2021-10-02 DIAGNOSIS — M25561 Pain in right knee: Secondary | ICD-10-CM | POA: Diagnosis not present

## 2021-10-03 DIAGNOSIS — Z0000 Encounter for general adult medical examination without abnormal findings: Secondary | ICD-10-CM | POA: Diagnosis not present

## 2021-10-03 DIAGNOSIS — M25569 Pain in unspecified knee: Secondary | ICD-10-CM | POA: Diagnosis not present

## 2021-10-03 DIAGNOSIS — E782 Mixed hyperlipidemia: Secondary | ICD-10-CM | POA: Diagnosis not present

## 2021-10-03 DIAGNOSIS — I129 Hypertensive chronic kidney disease with stage 1 through stage 4 chronic kidney disease, or unspecified chronic kidney disease: Secondary | ICD-10-CM | POA: Diagnosis not present

## 2021-10-03 DIAGNOSIS — I714 Abdominal aortic aneurysm, without rupture, unspecified: Secondary | ICD-10-CM | POA: Diagnosis not present

## 2021-10-03 DIAGNOSIS — Z1389 Encounter for screening for other disorder: Secondary | ICD-10-CM | POA: Diagnosis not present

## 2021-10-03 DIAGNOSIS — Z136 Encounter for screening for cardiovascular disorders: Secondary | ICD-10-CM | POA: Diagnosis not present

## 2021-10-03 DIAGNOSIS — N182 Chronic kidney disease, stage 2 (mild): Secondary | ICD-10-CM | POA: Diagnosis not present

## 2021-10-08 DIAGNOSIS — M25562 Pain in left knee: Secondary | ICD-10-CM | POA: Diagnosis not present

## 2021-10-08 DIAGNOSIS — M25561 Pain in right knee: Secondary | ICD-10-CM | POA: Diagnosis not present

## 2021-10-23 DIAGNOSIS — I2510 Atherosclerotic heart disease of native coronary artery without angina pectoris: Secondary | ICD-10-CM | POA: Diagnosis not present

## 2021-10-23 DIAGNOSIS — K219 Gastro-esophageal reflux disease without esophagitis: Secondary | ICD-10-CM | POA: Diagnosis not present

## 2021-10-23 DIAGNOSIS — I129 Hypertensive chronic kidney disease with stage 1 through stage 4 chronic kidney disease, or unspecified chronic kidney disease: Secondary | ICD-10-CM | POA: Diagnosis not present

## 2021-10-23 DIAGNOSIS — I10 Essential (primary) hypertension: Secondary | ICD-10-CM | POA: Diagnosis not present

## 2021-10-23 DIAGNOSIS — N182 Chronic kidney disease, stage 2 (mild): Secondary | ICD-10-CM | POA: Diagnosis not present

## 2021-10-23 DIAGNOSIS — E782 Mixed hyperlipidemia: Secondary | ICD-10-CM | POA: Diagnosis not present

## 2021-10-23 DIAGNOSIS — I25119 Atherosclerotic heart disease of native coronary artery with unspecified angina pectoris: Secondary | ICD-10-CM | POA: Diagnosis not present

## 2021-10-23 DIAGNOSIS — G8929 Other chronic pain: Secondary | ICD-10-CM | POA: Diagnosis not present

## 2021-10-28 DIAGNOSIS — N3000 Acute cystitis without hematuria: Secondary | ICD-10-CM | POA: Diagnosis not present

## 2021-10-29 DIAGNOSIS — H903 Sensorineural hearing loss, bilateral: Secondary | ICD-10-CM | POA: Diagnosis not present

## 2021-11-01 ENCOUNTER — Ambulatory Visit (HOSPITAL_COMMUNITY)
Admission: RE | Admit: 2021-11-01 | Discharge: 2021-11-01 | Disposition: A | Discharge status: Home / Self Care | Payer: Medicare Other | Source: Ambulatory Visit | Attending: Cardiovascular Disease | Admitting: Cardiovascular Disease

## 2021-11-01 ENCOUNTER — Other Ambulatory Visit: Payer: Self-pay

## 2021-11-01 DIAGNOSIS — I714 Abdominal aortic aneurysm, without rupture, unspecified: Secondary | ICD-10-CM

## 2021-11-01 DIAGNOSIS — R079 Chest pain, unspecified: Secondary | ICD-10-CM

## 2021-11-01 DIAGNOSIS — I10 Essential (primary) hypertension: Secondary | ICD-10-CM

## 2021-11-01 DIAGNOSIS — I25118 Atherosclerotic heart disease of native coronary artery with other forms of angina pectoris: Secondary | ICD-10-CM

## 2021-11-01 DIAGNOSIS — R42 Dizziness and giddiness: Secondary | ICD-10-CM

## 2021-11-01 DIAGNOSIS — E785 Hyperlipidemia, unspecified: Secondary | ICD-10-CM

## 2021-11-12 DIAGNOSIS — E860 Dehydration: Secondary | ICD-10-CM | POA: Diagnosis not present

## 2021-11-27 ENCOUNTER — Other Ambulatory Visit: Payer: Self-pay

## 2021-11-27 ENCOUNTER — Ambulatory Visit (HOSPITAL_COMMUNITY)
Admission: RE | Admit: 2021-11-27 | Discharge: 2021-11-27 | Disposition: A | Discharge status: Home / Self Care | Payer: Medicare Other | Source: Ambulatory Visit | Attending: Internal Medicine | Admitting: Internal Medicine

## 2021-11-27 DIAGNOSIS — R42 Dizziness and giddiness: Secondary | ICD-10-CM | POA: Insufficient documentation

## 2021-11-27 DIAGNOSIS — R079 Chest pain, unspecified: Secondary | ICD-10-CM | POA: Insufficient documentation

## 2021-11-27 DIAGNOSIS — E785 Hyperlipidemia, unspecified: Secondary | ICD-10-CM | POA: Diagnosis not present

## 2021-11-27 DIAGNOSIS — I10 Essential (primary) hypertension: Secondary | ICD-10-CM | POA: Insufficient documentation

## 2021-11-27 DIAGNOSIS — I714 Abdominal aortic aneurysm, without rupture, unspecified: Secondary | ICD-10-CM | POA: Insufficient documentation

## 2021-11-27 DIAGNOSIS — I25118 Atherosclerotic heart disease of native coronary artery with other forms of angina pectoris: Secondary | ICD-10-CM | POA: Diagnosis not present

## 2021-12-04 ENCOUNTER — Ambulatory Visit
Admission: RE | Admit: 2021-12-04 | Discharge: 2021-12-04 | Disposition: A | Discharge status: Home / Self Care | Payer: Medicare Other | Source: Ambulatory Visit | Attending: Physical Medicine and Rehabilitation | Admitting: Physical Medicine and Rehabilitation

## 2021-12-04 ENCOUNTER — Other Ambulatory Visit: Payer: Self-pay | Admitting: Physical Medicine and Rehabilitation

## 2021-12-04 DIAGNOSIS — M19011 Primary osteoarthritis, right shoulder: Secondary | ICD-10-CM | POA: Diagnosis not present

## 2021-12-04 DIAGNOSIS — M25511 Pain in right shoulder: Secondary | ICD-10-CM

## 2021-12-04 DIAGNOSIS — G894 Chronic pain syndrome: Secondary | ICD-10-CM

## 2021-12-04 DIAGNOSIS — M19012 Primary osteoarthritis, left shoulder: Secondary | ICD-10-CM | POA: Diagnosis not present

## 2021-12-10 DIAGNOSIS — H5213 Myopia, bilateral: Secondary | ICD-10-CM | POA: Diagnosis not present

## 2021-12-10 DIAGNOSIS — Z961 Presence of intraocular lens: Secondary | ICD-10-CM | POA: Diagnosis not present

## 2021-12-10 DIAGNOSIS — H26491 Other secondary cataract, right eye: Secondary | ICD-10-CM | POA: Diagnosis not present

## 2021-12-10 DIAGNOSIS — H40013 Open angle with borderline findings, low risk, bilateral: Secondary | ICD-10-CM | POA: Diagnosis not present

## 2021-12-11 DIAGNOSIS — N289 Disorder of kidney and ureter, unspecified: Secondary | ICD-10-CM | POA: Diagnosis not present

## 2022-01-10 DIAGNOSIS — K0889 Other specified disorders of teeth and supporting structures: Secondary | ICD-10-CM | POA: Diagnosis not present

## 2022-02-07 ENCOUNTER — Telehealth: Payer: Self-pay | Admitting: Cardiovascular Disease

## 2022-02-07 NOTE — Telephone Encounter (Signed)
Spoke to patient's daughter Burnett Kanaris.She was calling to verify if father is to take gas x before he has AAA duplex scheduled 4/17.Advised to follow the directions given. ?

## 2022-02-07 NOTE — Telephone Encounter (Signed)
Patient's daughter is calling wanting to know if the patient needs gas-x for upcoming test.  ?

## 2022-03-03 ENCOUNTER — Ambulatory Visit (HOSPITAL_COMMUNITY): Admission: RE | Admit: 2022-03-03 | Payer: Medicare Other | Source: Ambulatory Visit

## 2022-03-04 ENCOUNTER — Other Ambulatory Visit (HOSPITAL_COMMUNITY): Payer: Self-pay | Admitting: Family Medicine

## 2022-03-04 DIAGNOSIS — I714 Abdominal aortic aneurysm, without rupture, unspecified: Secondary | ICD-10-CM

## 2022-03-20 ENCOUNTER — Ambulatory Visit (HOSPITAL_COMMUNITY)
Admission: RE | Admit: 2022-03-20 | Discharge: 2022-03-20 | Disposition: A | Discharge status: Home / Self Care | Payer: Medicare Other | Source: Ambulatory Visit | Attending: Cardiovascular Disease | Admitting: Cardiovascular Disease

## 2022-03-20 DIAGNOSIS — I714 Abdominal aortic aneurysm, without rupture, unspecified: Secondary | ICD-10-CM | POA: Diagnosis not present

## 2022-03-20 DIAGNOSIS — I7143 Infrarenal abdominal aortic aneurysm, without rupture: Secondary | ICD-10-CM | POA: Diagnosis not present

## 2022-04-08 DIAGNOSIS — E782 Mixed hyperlipidemia: Secondary | ICD-10-CM | POA: Diagnosis not present

## 2022-04-08 DIAGNOSIS — R350 Frequency of micturition: Secondary | ICD-10-CM | POA: Diagnosis not present

## 2022-04-08 DIAGNOSIS — J342 Deviated nasal septum: Secondary | ICD-10-CM | POA: Diagnosis not present

## 2022-04-08 DIAGNOSIS — M25569 Pain in unspecified knee: Secondary | ICD-10-CM | POA: Diagnosis not present

## 2022-04-08 DIAGNOSIS — I714 Abdominal aortic aneurysm, without rupture, unspecified: Secondary | ICD-10-CM | POA: Diagnosis not present

## 2022-04-08 DIAGNOSIS — I25119 Atherosclerotic heart disease of native coronary artery with unspecified angina pectoris: Secondary | ICD-10-CM | POA: Diagnosis not present

## 2022-04-08 DIAGNOSIS — E46 Unspecified protein-calorie malnutrition: Secondary | ICD-10-CM | POA: Diagnosis not present

## 2022-04-08 DIAGNOSIS — I129 Hypertensive chronic kidney disease with stage 1 through stage 4 chronic kidney disease, or unspecified chronic kidney disease: Secondary | ICD-10-CM | POA: Diagnosis not present

## 2022-04-08 DIAGNOSIS — M25541 Pain in joints of right hand: Secondary | ICD-10-CM | POA: Diagnosis not present

## 2022-04-08 DIAGNOSIS — N182 Chronic kidney disease, stage 2 (mild): Secondary | ICD-10-CM | POA: Diagnosis not present

## 2022-05-06 DIAGNOSIS — M79644 Pain in right finger(s): Secondary | ICD-10-CM | POA: Insufficient documentation

## 2022-05-07 DIAGNOSIS — M79644 Pain in right finger(s): Secondary | ICD-10-CM | POA: Diagnosis not present

## 2022-05-07 DIAGNOSIS — M65341 Trigger finger, right ring finger: Secondary | ICD-10-CM | POA: Diagnosis not present

## 2022-05-09 DIAGNOSIS — M65341 Trigger finger, right ring finger: Secondary | ICD-10-CM | POA: Insufficient documentation

## 2022-05-22 ENCOUNTER — Other Ambulatory Visit: Payer: Self-pay | Admitting: Cardiovascular Disease

## 2022-07-06 ENCOUNTER — Other Ambulatory Visit: Payer: Self-pay | Admitting: Medical

## 2022-08-21 ENCOUNTER — Other Ambulatory Visit: Payer: Self-pay | Admitting: Cardiovascular Disease

## 2022-09-08 DIAGNOSIS — C678 Malignant neoplasm of overlapping sites of bladder: Secondary | ICD-10-CM | POA: Diagnosis not present

## 2022-10-03 DIAGNOSIS — J34829 Nasal valve collapse, unspecified: Secondary | ICD-10-CM | POA: Insufficient documentation

## 2022-10-03 DIAGNOSIS — J342 Deviated nasal septum: Secondary | ICD-10-CM | POA: Diagnosis not present

## 2022-10-03 DIAGNOSIS — M950 Acquired deformity of nose: Secondary | ICD-10-CM | POA: Diagnosis not present

## 2022-10-05 ENCOUNTER — Other Ambulatory Visit: Payer: Self-pay | Admitting: Cardiovascular Disease

## 2022-10-23 DIAGNOSIS — E46 Unspecified protein-calorie malnutrition: Secondary | ICD-10-CM | POA: Diagnosis not present

## 2022-10-23 DIAGNOSIS — N182 Chronic kidney disease, stage 2 (mild): Secondary | ICD-10-CM | POA: Diagnosis not present

## 2022-10-23 DIAGNOSIS — Z0000 Encounter for general adult medical examination without abnormal findings: Secondary | ICD-10-CM | POA: Diagnosis not present

## 2022-10-23 DIAGNOSIS — G720 Drug-induced myopathy: Secondary | ICD-10-CM | POA: Diagnosis not present

## 2022-10-23 DIAGNOSIS — E782 Mixed hyperlipidemia: Secondary | ICD-10-CM | POA: Diagnosis not present

## 2022-10-23 DIAGNOSIS — I25119 Atherosclerotic heart disease of native coronary artery with unspecified angina pectoris: Secondary | ICD-10-CM | POA: Diagnosis not present

## 2022-10-23 DIAGNOSIS — I714 Abdominal aortic aneurysm, without rupture, unspecified: Secondary | ICD-10-CM | POA: Diagnosis not present

## 2022-10-23 DIAGNOSIS — J342 Deviated nasal septum: Secondary | ICD-10-CM | POA: Diagnosis not present

## 2022-10-23 DIAGNOSIS — I129 Hypertensive chronic kidney disease with stage 1 through stage 4 chronic kidney disease, or unspecified chronic kidney disease: Secondary | ICD-10-CM | POA: Diagnosis not present

## 2022-11-19 ENCOUNTER — Other Ambulatory Visit: Payer: Self-pay | Admitting: Cardiovascular Disease

## 2022-11-26 DIAGNOSIS — H40013 Open angle with borderline findings, low risk, bilateral: Secondary | ICD-10-CM | POA: Diagnosis not present

## 2022-11-26 DIAGNOSIS — Z961 Presence of intraocular lens: Secondary | ICD-10-CM | POA: Diagnosis not present

## 2022-11-26 DIAGNOSIS — H26491 Other secondary cataract, right eye: Secondary | ICD-10-CM | POA: Diagnosis not present

## 2023-02-23 ENCOUNTER — Ambulatory Visit (INDEPENDENT_AMBULATORY_CARE_PROVIDER_SITE_OTHER): Payer: Medicare Other

## 2023-02-23 ENCOUNTER — Ambulatory Visit
Admission: EM | Admit: 2023-02-23 | Discharge: 2023-02-23 | Disposition: A | Discharge status: Home / Self Care | Payer: Medicare Other

## 2023-02-23 DIAGNOSIS — M5030 Other cervical disc degeneration, unspecified cervical region: Secondary | ICD-10-CM

## 2023-02-23 DIAGNOSIS — M19019 Primary osteoarthritis, unspecified shoulder: Secondary | ICD-10-CM

## 2023-02-23 DIAGNOSIS — M47812 Spondylosis without myelopathy or radiculopathy, cervical region: Secondary | ICD-10-CM | POA: Diagnosis not present

## 2023-02-23 DIAGNOSIS — M25511 Pain in right shoulder: Secondary | ICD-10-CM

## 2023-02-23 DIAGNOSIS — W19XXXA Unspecified fall, initial encounter: Secondary | ICD-10-CM | POA: Diagnosis not present

## 2023-02-23 DIAGNOSIS — S46811A Strain of other muscles, fascia and tendons at shoulder and upper arm level, right arm, initial encounter: Secondary | ICD-10-CM

## 2023-02-23 DIAGNOSIS — M542 Cervicalgia: Secondary | ICD-10-CM

## 2023-02-23 MED ORDER — METHOCARBAMOL 500 MG PO TABS: 500.0000 mg | ORAL_TABLET | Freq: Two times a day (BID) | ORAL | 0 refills | Status: DC

## 2023-02-23 MED ORDER — PREDNISONE 10 MG PO TABS: 40.0000 mg | ORAL_TABLET | Freq: Every day | ORAL | 0 refills | Status: DC

## 2023-02-23 NOTE — ED Provider Notes (Addendum)
[facility] - URGENT [facility]ARE [facility]  Note:  This document was prepared using Conservation officer, historic buildingsDragon voice recognition software and may include unintentional dictation errors.  MRN: [id number] DOB: [birth year]  Subjective:   Ricardo [name] is a [age] y.o. male presenting for 1 month history of acute onset persistent moderate to severe shooting pains from his neck down to his right shoulder.  Symptoms started following an accidental fall.  Larey SeatFell backwards making impact against the back of his head and neck.  As it was a slope, he continued to not slide down and ended up colliding into a crevice.  No head injury, loss conscious, headache, confusion, weakness, numbness or tingling.  Has been able to use the shoulder but still gets shooting pains between the base of his neck and trapezius connecting to the right shoulder.  Has a history of arthritis of his shoulder on review of his images from 2023.  No current facility-administered medications for this encounter.  Current Outpatient Medications:    aspirin 81 MG tablet, Take 1 tablet (81 mg total) by mouth daily., Disp: 1 tablet, Rfl: 0   cloNIDine (CATAPRES - DOSED IN MG/24 HR) 0.2 mg/24hr patch, Place 0.2 mg onto the skin every Monday., Disp: , Rfl:    diltiazem (CARDIZEM LA) 120 MG 24 hr tablet, Take 120 mg by mouth daily., Disp: , Rfl:    Evolocumab (REPATHA SURECLICK) 140 MG/ML SOAJ, Inject 140 mg into the skin See admin instructions. Inject 140 mg subcutaneously on the 1st and 15th of each month, Disp: 2 mL, Rfl: 11   lisinopril (ZESTRIL) 40 MG tablet, Take 40 mg by mouth daily., Disp: , Rfl:    rosuvastatin (CRESTOR) 5 MG tablet, Take 1 tablet (5 mg total) by mouth daily., Disp: 90 tablet, Rfl: 1   triamterene-hydrochlorothiazide (DYAZIDE) 37.5-25 MG capsule, Take 1 capsule by mouth daily., Disp: , Rfl:    acetaminophen (TYLENOL) 500 MG tablet, Take 1,000 mg by mouth daily as needed for headache (pain)., Disp: , Rfl:    chlorthalidone (HYGROTON) 25 MG tablet,  TAKE ONE TABLET BY MOUTH DAILY, Disp: 90 tablet, Rfl: 0   ciprofloxacin (CIPRO) 500 MG tablet, Take 500 mg by mouth 2 (two) times daily., Disp: , Rfl:    hydrALAZINE (APRESOLINE) 25 MG tablet, TAKE ONE TABLET BY MOUTH THREE TIMES A DAY, Disp: 270 tablet, Rfl: 3   isosorbide mononitrate (IMDUR) 60 MG 24 hr tablet, TAKE 1 AND 1/2 TABLET BY MOUTH DAILY, Disp: 135 tablet, Rfl: 0   meloxicam (MOBIC) 15 MG tablet, Take 15 mg by mouth daily., Disp: , Rfl:    nitroGLYCERIN (NITROSTAT) 0.4 MG SL tablet, Place 1 tablet (0.4 mg total) under the tongue every 5 (five) minutes as needed., Disp: 25 tablet, Rfl: 3   tamsulosin (FLOMAX) 0.4 MG CAPS capsule, Take 0.4 mg by mouth., Disp: , Rfl:    telmisartan (MICARDIS) 80 MG tablet, Take 1 tablet (80 mg total) by mouth daily., Disp: 90 tablet, Rfl: 1   Allergies  Allergen Reactions   Clonidine Hcl Other (See Comments)    Reaction to 0.2 mg tabs - caused dry mouth, drowsiness, lightheadedness - reported 07/08/2020 (no reaction to patch)   Cyclobenzaprine Other (See Comments)    Numbness in lips   Norvasc [Amlodipine Besylate] Other (See Comments)    CAUSES TEETH TO FALL OUT    Past Medical History:  Diagnosis Date   Abdominal aneurysm 08/30/2018   Arthritis    SHOULDERS, NECK , RIGHT HIP   Bladder  cancer DX   OCT 2011--  FOLLOWED BY DR WOODRUFF   S/P TURBT,  HIGH GRADE BLADDER CANCER   CAD (coronary artery disease), native coronary artery 08/30/2018   normal Left main, 30% stenosis ostial LAD, 50% stenosis OM 3, 80% stenosis mid RCA;  2.5 x 8 mm Tetra Stent Dr. Algie Coffer   Coronary artery disease CARDIOLOGIST- DR Donnie Aho-  LAST VISIT 2 WKS AGO-- WILL REQUEST NOTE AND EKG   STRESS TEST -- JULY 2009   Essential hypertension 08/30/2018   Hyperlipidemia    Hypertension    Nocturia    Personal history of malignant neoplasm of bladder 08/30/2018   Post PTCA 2001-   X1 STENT TO RCA   AND 1996     Past Surgical History:  Procedure Laterality Date    CORONARY ANGIOPLASTY  1996   CORONARY ANGIOPLASTY WITH STENT PLACEMENT  2001   STENT TO RCA   CYSTO/ BLADDER BX  10-22-10   CYSTOSCOPY W/ RETROGRADES  11/12/2011   Procedure: CYSTOSCOPY WITH RETROGRADE PYELOGRAM;  Surgeon: Milford Cage, MD;  Location: [facility];  Service: Urology;  Laterality: Bilateral;   LEFT HEART CATH AND CORONARY ANGIOGRAPHY N/A 07/09/2020   Procedure: LEFT HEART CATH AND CORONARY ANGIOGRAPHY;  Surgeon: Orpah Cobb, MD;  Location: [facility];  Service: Cardiovascular;  Laterality: N/A;   LUMBAR FUSION     TRANSURETHRAL RESECTION OF BLADDER TUMOR  08-27-10   RIGHT ANTERIOR DOME   TRANSURETHRAL RESECTION OF BLADDER TUMOR  11/12/2011   Procedure: TRANSURETHRAL RESECTION OF BLADDER TUMOR (TURBT);  Surgeon: Milford Cage, MD;  Location: [facility];  Service: Urology;  Laterality: N/A;  with PK Gyrus c-arm gyrus  digital ureteroscope    Family History  Problem Relation Age of Onset   Diabetes Mother    Hypertension Mother     Social History   Tobacco Use   Smoking status: Former    Packs/day: 1.50    Years: 5.00    Additional pack years: 0.00    Total pack years: 7.50    Types: Cigarettes    Quit date: 11/05/1965    Years since quitting: 57.3   Smokeless tobacco: Never  Vaping Use   Vaping Use: Never used  Substance Use Topics   Alcohol use: No   Drug use: No    ROS   Objective:   Vitals: BP (!) 157/74 (BP Location: Right Arm)   Pulse (!) 47   Temp 98.5 F (36.9 C) (Oral)   Resp 18   SpO2 97%   Physical Exam Constitutional:      General: He is not in acute distress.    Appearance: Normal appearance. He is well-developed and normal weight. He is not ill-appearing, toxic-appearing or diaphoretic.  HENT:     Head: Normocephalic and atraumatic.     Right Ear: Tympanic membrane, ear canal and external ear normal. No drainage, swelling or tenderness. No middle ear effusion. There is no  impacted cerumen. Tympanic membrane is not erythematous or bulging.     Left Ear: Tympanic membrane, ear canal and external ear normal. No drainage, swelling or tenderness.  No middle ear effusion. There is no impacted cerumen. Tympanic membrane is not erythematous or bulging.     Nose: Nose normal. No congestion or rhinorrhea.     Mouth/Throat:     Mouth: Mucous membranes are moist.     Pharynx: No oropharyngeal exudate or posterior oropharyngeal erythema.  Eyes:  General: No scleral icterus.       Right eye: No discharge.        Left eye: No discharge.     Extraocular Movements: Extraocular movements intact.     Conjunctiva/sclera: Conjunctivae normal.  Cardiovascular:     Rate and Rhythm: Normal rate.  Pulmonary:     Effort: Pulmonary effort is normal.  Musculoskeletal:     Cervical back: Neck supple. Spasms and tenderness (over areas outlined) present. No swelling, edema, deformity, erythema, signs of trauma, lacerations, rigidity, torticollis, bony tenderness or crepitus. Pain with movement present. No muscular tenderness. Decreased range of motion.     Thoracic back: No swelling, edema, deformity, signs of trauma, lacerations, spasms, tenderness or bony tenderness. Normal range of motion. No scoliosis.       Back:     Comments: Positive Spurling maneuver to the right.  Neurological:     General: No focal deficit present.     Mental Status: He is alert and oriented to person, place, and time.     Cranial Nerves: No cranial nerve deficit.     Motor: No weakness.     Coordination: Coordination normal.     Gait: Gait normal.  Psychiatric:        Mood and Affect: Mood normal.        Behavior: Behavior normal.        Thought Content: Thought content normal.        Judgment: Judgment normal.     DG Cervical Spine Complete  Result Date: 02/23/2023 CLINICAL DATA:  Fall 1 month ago, neck and shoulder pain. EXAM: CERVICAL SPINE - COMPLETE 4+ VIEW COMPARISON:  None Available.  FINDINGS: The cervical spine is imaged through the T1 vertebral body in the lateral projection. Vertebral body heights appear preserved, without definite evidence of acute injury. Alignment is normal. There is overall mild multilevel disc space narrowing with degenerative endplate change most advanced at C3 through C6. There is overall mild multilevel facet arthropathy without high-grade osseous neural foraminal stenosis. The prevertebral soft tissues are unremarkable. IMPRESSION: 1. No definite evidence of acute injury in the cervical spine. 2. Overall mild for age multilevel degenerative changes as above. Electronically Signed   By: Lesia Hausen M.D.   On: 02/23/2023 14:51     Assessment and Plan :   PDMP not reviewed this encounter.  1. Acute pain of right shoulder   2. Neck pain   3. Strain of right trapezius muscle, initial encounter   4. Degenerative disc disease, cervical   5. Shoulder arthritis     Recommended oral prednisone course given the significant degenerative for this disease, distribution of his pain.  Recommended follow-up closely with an orthopedist for consideration of MRI of the neck and shoulder. Counseled patient on potential for adverse effects with medications prescribed/recommended today, ER and return-to-clinic precautions discussed, patient verbalized understanding.  I do not suspect an insidious head injury and patient has an overall reassuring neurologic exam.  Therefore will defer recommendation for MRI or CT scan of the brain.   Wallis Bamberg, PA-C 02/23/23 1529

## 2023-02-23 NOTE — ED Triage Notes (Signed)
Pt c/o pain and decreased ROM to R shoulder and neck s/p mechanical fall that occurred approx 1 month ago. Pt reports falling down a dirt slope, hitting his head in a crevice, and then d/t his body's momentum, his body "kept going" causing pain in neck and shoulder. No LOC at that time, though pt states he was disoriented following event. No blood thinners. Pt ambulatory.

## 2023-03-11 ENCOUNTER — Ambulatory Visit: Payer: Medicare Other | Admitting: Family Medicine

## 2023-03-16 ENCOUNTER — Ambulatory Visit
Admission: RE | Admit: 2023-03-16 | Discharge: 2023-03-16 | Disposition: A | Discharge status: Home / Self Care | Payer: Medicare Other | Source: Ambulatory Visit | Attending: Physician Assistant | Admitting: Physician Assistant

## 2023-03-16 ENCOUNTER — Other Ambulatory Visit: Payer: Self-pay | Admitting: Physician Assistant

## 2023-03-16 DIAGNOSIS — M542 Cervicalgia: Secondary | ICD-10-CM | POA: Diagnosis not present

## 2023-04-24 ENCOUNTER — Other Ambulatory Visit: Payer: Self-pay | Admitting: Family Medicine

## 2023-04-24 DIAGNOSIS — D696 Thrombocytopenia, unspecified: Secondary | ICD-10-CM | POA: Diagnosis not present

## 2023-04-24 DIAGNOSIS — I77811 Abdominal aortic ectasia: Secondary | ICD-10-CM | POA: Diagnosis not present

## 2023-04-24 DIAGNOSIS — G4700 Insomnia, unspecified: Secondary | ICD-10-CM | POA: Diagnosis not present

## 2023-04-24 DIAGNOSIS — I129 Hypertensive chronic kidney disease with stage 1 through stage 4 chronic kidney disease, or unspecified chronic kidney disease: Secondary | ICD-10-CM | POA: Diagnosis not present

## 2023-04-24 DIAGNOSIS — I77819 Aortic ectasia, unspecified site: Secondary | ICD-10-CM

## 2023-04-24 DIAGNOSIS — I25119 Atherosclerotic heart disease of native coronary artery with unspecified angina pectoris: Secondary | ICD-10-CM | POA: Diagnosis not present

## 2023-04-24 DIAGNOSIS — N182 Chronic kidney disease, stage 2 (mild): Secondary | ICD-10-CM | POA: Diagnosis not present

## 2023-04-24 DIAGNOSIS — E782 Mixed hyperlipidemia: Secondary | ICD-10-CM | POA: Diagnosis not present

## 2023-04-27 ENCOUNTER — Ambulatory Visit
Admission: RE | Admit: 2023-04-27 | Discharge: 2023-04-27 | Disposition: A | Discharge status: Home / Self Care | Payer: Medicare Other | Source: Ambulatory Visit | Attending: Family Medicine | Admitting: Family Medicine

## 2023-04-27 DIAGNOSIS — I714 Abdominal aortic aneurysm, without rupture, unspecified: Secondary | ICD-10-CM | POA: Diagnosis not present

## 2023-04-27 DIAGNOSIS — I77819 Aortic ectasia, unspecified site: Secondary | ICD-10-CM

## 2023-05-04 NOTE — Progress Notes (Unsigned)
Patient name: Ricardo [name] MRN: [id number] DOB: July 26, 1941 Sex: male  REASON FOR CONSULT: 4.2 cm AAA  HPI: Ricardo [name] is a 82 y.o. 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  States this has been followed for years by his PCP Dr. Nicholos Johns who recently retired.  The last ultrasound of his aorta on 04/27/2023 showed this measured 3.3 x 4.2 cm.  Previously measured 3.6 cm last year on 03/20/2022.  Denies any abdominal or back pain.    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 Algie Coffer   Coronary artery disease CARDIOLOGIST- DR Donnie Aho-  LAST VISIT 2 WKS AGO-- WILL REQUEST NOTE AND EKG   STRESS TEST -- JULY 2009   Essential hypertension 08/30/2018   Hyperlipidemia    Hypertension    Nocturia    Personal history of malignant neoplasm of bladder 08/30/2018   Post PTCA 2001-   X1 STENT TO RCA   AND 1996    Past Surgical History:  Procedure Laterality Date   CORONARY ANGIOPLASTY  1996   CORONARY ANGIOPLASTY WITH STENT PLACEMENT  2001   STENT TO RCA   CYSTO/ BLADDER BX  10-22-10   CYSTOSCOPY W/ RETROGRADES  11/12/2011   Procedure: CYSTOSCOPY WITH RETROGRADE PYELOGRAM;  Surgeon: Milford Cage, MD;  Location: [facility];  Service: Urology;  Laterality: Bilateral;   LEFT HEART CATH AND CORONARY ANGIOGRAPHY N/A 07/09/2020   Procedure: LEFT HEART CATH AND CORONARY ANGIOGRAPHY;  Surgeon: Orpah Cobb, MD;  Location: [facility];  Service: Cardiovascular;  Laterality: N/A;    LUMBAR FUSION     TRANSURETHRAL RESECTION OF BLADDER TUMOR  08-27-10   RIGHT ANTERIOR DOME   TRANSURETHRAL RESECTION OF BLADDER TUMOR  11/12/2011   Procedure: TRANSURETHRAL RESECTION OF BLADDER TUMOR (TURBT);  Surgeon: Milford Cage, MD;  Location: [facility];  Service: Urology;  Laterality: N/A;  with PK Gyrus c-arm gyrus  digital ureteroscope    Family History  Problem Relation Age of Onset   Diabetes Mother    Hypertension Mother     SOCIAL HISTORY: Social History   Socioeconomic History   Marital status: Married    Spouse name: Not on file   Number of children: 4   Years of education: Not on file   Highest education level: Not on file  Occupational History   Occupation: retired - Advice worker  Tobacco Use   Smoking status: Former    Packs/day: 1.50    Years: 5.00    Additional pack years: 0.00    Total pack years: 7.50    Types: Cigarettes    Quit date: 11/05/1965    Years since quitting: 57.5   Smokeless tobacco: Never  Vaping Use   Vaping Use: Never used  Substance and Sexual Activity   Alcohol use: No   Drug use: No   Sexual activity: Not on file  Other Topics Concern   Not on file  Social History Narrative   Not on file   Social Determinants of Health   Financial Resource Strain: Not on file  Food Insecurity: Not on file  Transportation Needs: Not on file  Physical Activity: Not on file  Stress: Not on file  Social Connections: Not on file  Intimate Partner Violence: Not on file    Allergies  Allergen Reactions   Clonidine Hcl Other (See Comments)    Reaction to 0.2 mg tabs - caused dry mouth, drowsiness, lightheadedness - reported 07/08/2020 (no reaction to patch)   Cyclobenzaprine Other (See Comments)    Numbness in lips   Norvasc [Amlodipine Besylate] Other (See Comments)    CAUSES TEETH TO FALL OUT    Current Outpatient Medications  Medication Sig Dispense Refill   acetaminophen (TYLENOL) 500 MG tablet Take  1,000 mg by mouth daily as needed for headache (pain).     aspirin 81 MG tablet Take 1 tablet (81 mg total) by mouth daily. 1 tablet 0   chlorthalidone (HYGROTON) 25 MG tablet TAKE ONE TABLET BY MOUTH DAILY 90 tablet 0   ciprofloxacin (CIPRO) 500 MG tablet Take 500 mg by mouth 2 (two) times daily.     cloNIDine (CATAPRES - DOSED IN MG/24 HR) 0.2 mg/24hr patch Place 0.2 mg onto the skin every Monday.     diltiazem (CARDIZEM LA) 120 MG 24 hr tablet Take 120 mg by mouth daily.     Evolocumab (REPATHA SURECLICK) 140 MG/ML SOAJ Inject 140 mg into the skin See admin instructions. Inject 140 mg subcutaneously on the 1st and 15th of each month 2 mL 11   hydrALAZINE (APRESOLINE) 25 MG tablet TAKE ONE TABLET BY MOUTH THREE TIMES A DAY 270 tablet 3   isosorbide mononitrate (IMDUR) 60 MG 24 hr tablet TAKE 1 AND 1/2 TABLET BY MOUTH DAILY 135 tablet 0   lisinopril (ZESTRIL) 40 MG tablet Take 40 mg by mouth daily.     meloxicam (MOBIC) 15 MG tablet Take 15 mg by mouth daily.     methocarbamol (ROBAXIN) 500 MG tablet Take 1 tablet (500 mg total) by mouth 2 (two) times daily. 30 tablet 0   nitroGLYCERIN (NITROSTAT) 0.4 MG SL tablet Place 1 tablet (0.4 mg total) under the tongue every 5 (five) minutes as needed. 25 tablet 3   predniSONE (DELTASONE) 10 MG tablet Take 4 tablets (40 mg total) by mouth daily with breakfast. 20 tablet 0   rosuvastatin (CRESTOR) 5 MG tablet Take 1 tablet (5 mg total) by mouth daily. 90 tablet 1   tamsulosin (FLOMAX) 0.4 MG CAPS capsule Take 0.4 mg by mouth.     telmisartan (MICARDIS) 80 MG tablet Take 1 tablet (80 mg total) by mouth daily. 90 tablet 1   triamterene-hydrochlorothiazide (DYAZIDE) 37.5-25 MG capsule Take 1 capsule by mouth daily.     No current facility-administered medications for this visit.    REVIEW OF SYSTEMS:  [X]  denotes positive finding, [ ]  denotes negative finding Cardiac  Comments:  Chest pain or chest pressure:    Shortness of breath upon exertion:     Short of breath when lying flat:    Irregular heart rhythm:        Vascular    Pain in calf, thigh, or hip brought on by ambulation:    Pain in feet at night that wakes you up from your sleep:     Blood clot in your veins:    Leg swelling:  Pulmonary    Oxygen at home:    Productive cough:     Wheezing:         Neurologic    Sudden weakness in arms or legs:     Sudden numbness in arms or legs:     Sudden onset of difficulty speaking or slurred speech:    Temporary loss of vision in one eye:     Problems with dizziness:         Gastrointestinal    Blood in stool:     Vomited blood:         Genitourinary    Burning when urinating:     Blood in urine:        Psychiatric    Major depression:         Hematologic    Bleeding problems:    Problems with blood clotting too easily:        Skin    Rashes or ulcers:        Constitutional    Fever or chills:      PHYSICAL EXAM: There were no vitals filed for this visit.  GENERAL: The patient is a well-nourished male, in no acute distress. The vital signs are documented above. CARDIAC: There is a regular rate and rhythm.  VASCULAR:  Bilateral femoral pulses palpable Bilateral DP pulses palpable PULMONARY: No respiratory distress ABDOMEN: Soft and non-tender.  No pain with palpation of aneurysm MUSCULOSKELETAL: There are no major deformities or cyanosis. NEUROLOGIC: No focal weakness or paresthesias are detected. SKIN: There are no ulcers or rashes noted. PSYCHIATRIC: The patient has a normal affect.  DATA:   CLINICAL DATA:  Follow-up abdominal aortic aneurysm. History of hypertension and hyperlipidemia. History of previous surgery.   EXAM: ULTRASOUND OF ABDOMINAL AORTA   TECHNIQUE: Ultrasound examination of the abdominal aorta and proximal common iliac arteries was performed to evaluate for aneurysm. Additional color and Doppler images of the distal aorta were obtained to document patency.    COMPARISON:  September 15, 2019   FINDINGS: Abdominal aortic measurements as follows:   Proximal:  2.3 x 2.5 cm   Mid:  2.7 x 2.6 cm   Distal: 3.3 x 4.2 cm today versus 3.3 x 3.6 cm on the previous study cm Patent: Yes, peak systolic velocity is 155 cm/s   Right common iliac artery: 1.3 cm   Left common iliac artery: 1.2 cm   IMPRESSION: The distal abdominal aortic aneurysm measures 3.3 x 4.2 cm today versus 3.3 x 3.6 cm on the previous study. The difference in transverse measurement may be technical, due to difference in measurement technique. Recommend follow-up every 12 months and vascular consultation. This recommendation follows ACR consensus guidelines: White Paper of the ACR Incidental Findings Committee II on Vascular Findings. J Am Coll Radiol 2013; 10:789-794.     Electronically Signed   By: Gerome Sam III M.D.   On: 04/27/2023 10:58  Assessment/Plan:  82 y.o. 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  I discussed his aneurysm remains small and does not require surgical intervention at this time.  I discussed that in men we repair abdominal aortic aneurysms at greater than 5.5 cm unless there is rapid growth.  I do not see any indication for repair at this time.  Will repeat ultrasound in 1 year in the office with follow-up with me for continued surveillance.  Cephus Shelling, MD [facility] Office: [phone]

## 2023-05-05 ENCOUNTER — Encounter: Payer: Self-pay | Admitting: Vascular Surgery

## 2023-05-05 ENCOUNTER — Ambulatory Visit: Payer: Medicare Other | Admitting: Vascular Surgery

## 2023-05-05 VITALS — BP 177/81 | HR 65 | Temp 97.6°F | Resp 16 | Ht 66.0 in | Wt 174.0 lb

## 2023-05-05 DIAGNOSIS — I7143 Infrarenal abdominal aortic aneurysm, without rupture: Secondary | ICD-10-CM | POA: Diagnosis not present

## 2023-05-16 ENCOUNTER — Other Ambulatory Visit: Payer: Self-pay

## 2023-05-16 DIAGNOSIS — I7143 Infrarenal abdominal aortic aneurysm, without rupture: Secondary | ICD-10-CM

## 2023-07-22 ENCOUNTER — Other Ambulatory Visit: Payer: Self-pay | Admitting: Cardiovascular Disease

## 2023-09-25 NOTE — Progress Notes (Unsigned)
Cardiology Office Note    Date:  09/25/2023  ID:  Ricardo [name], Ricardo [name] 1941-09-05, MRN [id number] PCP:  Ricardo Else, MD (Inactive)  Cardiologist:  Ricardo Harps, MD  Electrophysiologist:  None   Chief Complaint: ***  History of Present Illness: .    Ricardo [name] is a 82 y.o. male with visit-pertinent history of CAD, hyperlipidemia, hypertension, AAA, orthostatic hypotension, carotid artery disease.  History of RCA stent in 2001.  He was admitted in August 2021 for unstable angina, was found to have mild to moderate nonobstructive CAD.  He was admitted in 04/2021 due to chest pain, hypertensive emergency and bradycardia.  Echo with normal LVEF, grade 1 DD, no RWMA.  His hydralazine was increased to 50 mg 3 times daily, it was felt his chest pain was due to hypertension and his bradycardia resolved.  When seen in follow-up the following month he reported significant lightheaded and dizziness.  Blood pressure was labile at home from 86/40 to 150/90.  The timings of his medications were adjusted.  His Imdur was later increased to 90 mg daily for increased BP control and was also recommended that he wear a ZIO monitor.  His ZIO monitor showed predominantly normal sinus rhythm and average heart rate of 63 bpm, 12 SVT runs longest 13 seconds which were asymptomatic and rare PACs and PVCs.  Triggered episodes were associated with sinus rhythm.   On follow-up in 06/2021 he reported episodes of chest pain, flushing and occasional orthostatic symptoms.  His hydralazine was reduced to 25 mg 3 times daily and chlorthalidone was increased to 25 mg daily.  He underwent Lexiscan which was a low restudy with no evidence of ischemia.  He was last seen in clinic on 09/30/2021.  CAD: History of stent to the RCA in 2001.  LHC in 06/2020 showed mild to moderate nonobstructive CAD.  Lexiscan Myoview in 07/2021 with no ischemia. Stable with no anginal symptoms. No indication for ischemic evaluation.   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 Chestine [name] with [facility].  He plans for him to have a repeat ultrasound in 1 year with follow-up.  Carotid artery disease: Last carotid duplex on 11/27/2021 indicated bilateral ICA velocities consistent with a 1 to 39% stenosis.  Will repeat carotid duplex for monitoring.  CKD stage 2:   Labwork independently reviewed:   ROS: .    *** denies chest pain, shortness of breath, lower extremity edema, fatigue, palpitations, melena, hematuria, hemoptysis, diaphoresis, weakness, presyncope, syncope, orthopnea, and PND.  All other systems are reviewed and otherwise negative.  Studies Reviewed: Marland Kitchen    EKG:  EKG is ordered today, personally reviewed, demonstrating ***     CV Studies:  Cardiac Studies & Procedures   CARDIAC CATHETERIZATION  CARDIAC CATHETERIZATION 07/09/2020  Narrative  Ost LAD to Prox LAD lesion is 50% stenosed.  Mid LAD to Dist LAD lesion is 50% stenosed.  Ost RCA lesion is 40% stenosed.  Prox RCA lesion is 40% stenosed.  Mid RCA lesion is 40% stenosed.  2nd Diag lesion is 50% stenosed.  2nd Mrg lesion is 60% stenosed.  Mid Cx lesion is 30% stenosed.  Mild to moderate multivessel disease. Add isosorbide and ASA and Plavix  for 6 months and life style modification.  Findings Coronary Findings Diagnostic  Dominance: Right  Left Main Vessel was injected. Vessel is normal in caliber. Vessel is angiographically normal.  Left Anterior Descending Vessel was injected. Vessel is normal in caliber. Ost LAD to Prox LAD lesion is 50% stenosed. Vessel is not the culprit lesion. The lesion is type A, focal and concentric. The lesion was not previously treated. The  stenosis was measured by a visual reading. Pressure wire/FFR was not performed on the lesion. IVUS was not performed. Mid LAD to Dist LAD lesion is 50% stenosed. Vessel is not the culprit lesion. The lesion is type C, tubular and concentric. The lesion was not previously treated. The stenosis was measured by a visual reading. Pressure wire/FFR was not performed on the lesion. IVUS was not performed.  Second Diagonal Branch 2nd Diag lesion is 50% stenosed. Vessel is not the culprit lesion. The lesion is type C and concentric. The lesion was not previously treated. The stenosis was measured by a visual reading. Pressure wire/FFR was not performed on the lesion. IVUS was not performed.  Left Circumflex Vessel was injected. Vessel is very large. Mid Cx lesion is 30% stenosed. Vessel is not the culprit lesion. The lesion is type A and concentric. The lesion was not previously treated. The stenosis was measured by a visual reading. Pressure wire/FFR was not performed on the lesion. IVUS was not performed.  Second Obtuse Marginal Branch 2nd Mrg lesion is 60% stenosed. Vessel is not the culprit lesion. The lesion is type C and concentric. The lesion was not previously treated. The stenosis was measured by a visual reading. Pressure wire/FFR was not performed on the lesion. IVUS was not performed.  Right Coronary Artery Vessel was injected. Vessel is moderate in size. Ost RCA lesion is 40% stenosed. Vessel is not the culprit lesion. The lesion is type A and concentric. The lesion is mildly calcified. The lesion was not previously treated. The stenosis was measured by a visual reading. Pressure wire/FFR was not performed on the lesion. IVUS was not performed. Significant spasm, relived with IC nitro 200 mcg. Prox RCA lesion is 40% stenosed. Vessel is not the culprit lesion. The lesion is type A and concentric. The lesion was not previously treated. The stenosis was measured by a visual reading. Pressure  wire/FFR was not performed on the lesion. IVUS was not performed. Mid RCA lesion is 40% stenosed. Vessel is not the culprit lesion. The lesion is type A and concentric. The lesion was not previously treated. The stenosis was measured by a visual reading. Pressure wire/FFR was not performed on the lesion. IVUS was not performed.  Intervention  No interventions have been documented.   STRESS TESTS  MYOCARDIAL PERFUSION IMAGING 08/16/2021  Narrative   Findings are consistent with no prior ischemia and no prior myocardial infarction. The study is intermediate risk due to reduced systolic function.   No ST deviation was noted.   LV perfusion is normal. There is no evidence of ischemia. There is no evidence of infarction.   Left ventricular function is abnormal. Global function is mildly reduced. There were no regional wall motion abnormalities. Nuclear stress EF: 49 %. The left ventricular ejection fraction is mildly decreased (45-54%). End diastolic cavity size is normal. End systolic cavity size is normal.   Prior study not available for comparison.  Negative stress test.   ECHOCARDIOGRAM  ECHOCARDIOGRAM COMPLETE 05/12/2021  Narrative ECHOCARDIOGRAM REPORT    Patient Name:   GARVEY  A Sundberg Date of Exam: 05/12/2021 Medical Rec #:  [id number]      Height:       69.0 in Accession #:    [id number]     Weight:       169.6 lb Date of Birth:  1941-05-31      BSA:          1.926 m Patient Age:    80 years       BP:           125/52 mmHg Patient Gender: M              HR:           61 bpm. Exam Location:  Inpatient  Procedure: 2D Echo, Cardiac Doppler and Color Doppler  Indications:    R07.9* Chest pain, unspecified  History:        Patient has prior history of Echocardiogram examinations, most recent 07/08/2020. CAD; Risk Factors:Hypertension and Dyslipidemia. Cancer.  Sonographer:    Tiffany Dance Referring Phys: [id number] RAVI PAHWANI   Sonographer Comments: Suboptimal subcostal  window. IMPRESSIONS   1. Left ventricular ejection fraction, by estimation, is 60 to 65%. The left ventricle has normal function. The left ventricle has no regional wall motion abnormalities. There is mild concentric left ventricular hypertrophy. Left ventricular diastolic parameters are consistent with Grade I diastolic dysfunction (impaired relaxation). 2. Right ventricular systolic function is normal. The right ventricular size is normal. Tricuspid regurgitation signal is inadequate for assessing PA pressure. 3. The mitral valve is grossly normal. Trivial mitral valve regurgitation. No evidence of mitral stenosis. 4. The aortic valve is tricuspid. Aortic valve regurgitation is trivial. No aortic stenosis is present.  FINDINGS Left Ventricle: Left ventricular ejection fraction, by estimation, is 60 to 65%. The left ventricle has normal function. The left ventricle has no regional wall motion abnormalities. The left ventricular internal cavity size was normal in size. There is mild concentric left ventricular hypertrophy. Left ventricular diastolic parameters are consistent with Grade I diastolic dysfunction (impaired relaxation).  Right Ventricle: The right ventricular size is normal. No increase in right ventricular wall thickness. Right ventricular systolic function is normal. Tricuspid regurgitation signal is inadequate for assessing PA pressure.  Left Atrium: Left atrial size was normal in size.  Right Atrium: Right atrial size was normal in size.  Pericardium: There is no evidence of pericardial effusion.  Mitral Valve: The mitral valve is grossly normal. Trivial mitral valve regurgitation. No evidence of mitral valve stenosis.  Tricuspid Valve: The tricuspid valve is grossly normal. Tricuspid valve regurgitation is trivial. No evidence of tricuspid stenosis.  Aortic Valve: The aortic valve is tricuspid. Aortic valve regurgitation is trivial. No aortic stenosis is  present.  Pulmonic Valve: The pulmonic valve was grossly normal. Pulmonic valve regurgitation is not visualized. No evidence of pulmonic stenosis.  Aorta: The aortic root and ascending aorta are structurally normal, with no evidence of dilitation.  Venous: The inferior vena cava was not well visualized.  IAS/Shunts: The atrial septum is grossly normal.   LEFT VENTRICLE PLAX 2D LVIDd:         3.30 cm  Diastology LVIDs:         2.60 cm  LV e' medial:    6.64 cm/s LV PW:         1.30 cm  LV E/e' medial:  8.7 LV IVS:        1.20 cm  LV e' lateral:   9.68 cm/s LVOT diam:  1.90 cm  LV E/e' lateral: 5.9 LV SV:         66 LV SV Index:   34 LVOT Area:     2.84 cm   RIGHT VENTRICLE RV Basal diam:  2.20 cm RV S prime:     11.30 cm/s TAPSE (M-mode): 2.0 cm  LEFT ATRIUM             Index       RIGHT ATRIUM           Index LA diam:        4.30 cm 2.23 cm/m  RA Area:     11.80 cm LA Vol (A2C):   74.8 ml 38.83 ml/m RA Volume:   23.50 ml  12.20 ml/m LA Vol (A4C):   44.7 ml 23.21 ml/m LA Biplane Vol: 58.4 ml 30.32 ml/m AORTIC VALVE LVOT Vmax:   98.50 cm/s LVOT Vmean:  63.700 cm/s LVOT VTI:    0.232 m  AORTA Ao Root diam: 3.30 cm Ao Asc diam:  3.30 cm  MITRAL VALVE MV Area (PHT): 2.73 cm    SHUNTS MV Decel Time: 278 msec    Systemic VTI:  0.23 m MV E velocity: 57.50 cm/s  Systemic Diam: 1.90 cm MV A velocity: 97.30 cm/s MV E/A ratio:  0.59  Lennie Odor MD Electronically signed by Lennie Odor MD Signature Date/Time: 05/12/2021/10:05:24 AM    Final    MONITORS  LONG TERM MONITOR (3-14 DAYS) 08/05/2021  Narrative Enrollment 07/12/2021-07/26/2021. Patient had a min HR of 38 bpm (sinus bradycardia), max HR of 171 bpm (supraventricular tachycardia, 2.6 second duration), and avg HR of 63 bpm (normal sinus rhythm). Predominant underlying rhythm was Sinus Rhythm. 12 Supraventricular Tachycardia runs occurred, the run with the fastest interval lasting 5 beats (2.6 second  duration) with a max rate of 171 bpm, the longest lasting 13.0 secs (25 beats) with an avg rate of 119 bpm. Isolated SVEs were rare (<1.0%), SVE Couplets were rare (<1.0%), and SVE Triplets were rare (<1.0%). Isolated VEs were rare (<1.0%), VE Couplets were rare (<1.0%), and no VE Triplets were present. Ventricular Bigeminy was present.  07/12/21 09:17pm lightheaded coincided with normal sinus rhythm 64 bpm. 07/13/21 03:44pm Hot flash/flushed coincided with normal sinus rhythm 67 bpm. 07/14/21 12:09am chest pain/pressure coincided with normal sinus rhythm 68 bpm. 07/14/21 12:21am left face pain coincided with normal sinus rhythm 64 bpm. 07/15/21 09:00am head throbbing, pounding coincided with normal sinus rhythm 74 bpm.  Impression: 1. No significant arrhythmias. 2. Rare ectopy.  Gerri [name] T. Flora Lipps, MD, [facility]  [facility] [address], [address] [place], [place] [address] [phone] 8:04 AM             Current Reported Medications:.    No outpatient medications have been marked as taking for the 09/28/23 encounter (Appointment) with Rip Harbour, NP.    Physical Exam:    VS:  There were no vitals taken for this visit.   Wt Readings from Last 3 Encounters:  05/05/23 174 lb (78.9 kg)  09/30/21 186 lb (84.4 kg)  08/30/21 184 lb 6.4 oz (83.6 kg)    GEN: Well nourished, well developed in no acute distress NECK: No JVD; No carotid bruits CARDIAC: ***RRR, no murmurs, rubs, gallops RESPIRATORY:  Clear to auscultation without rales, wheezing or rhonchi  ABDOMEN: Soft, non-tender, non-distended EXTREMITIES:  No edema; No acute deformity   Asessement and Plan:.     ***     Disposition: F/u  with ***  Signed, Rip Harbour, NP

## 2023-09-28 ENCOUNTER — Encounter: Payer: Self-pay | Admitting: Cardiology

## 2023-09-28 ENCOUNTER — Ambulatory Visit: Payer: Medicare Other | Attending: Cardiology | Admitting: Cardiology

## 2023-09-28 VITALS — BP 120/60 | HR 50 | Ht 66.0 in | Wt 167.8 lb

## 2023-09-28 DIAGNOSIS — I2510 Atherosclerotic heart disease of native coronary artery without angina pectoris: Secondary | ICD-10-CM | POA: Diagnosis not present

## 2023-09-28 DIAGNOSIS — I10 Essential (primary) hypertension: Secondary | ICD-10-CM

## 2023-09-28 DIAGNOSIS — E785 Hyperlipidemia, unspecified: Secondary | ICD-10-CM | POA: Diagnosis not present

## 2023-09-28 DIAGNOSIS — I714 Abdominal aortic aneurysm, without rupture, unspecified: Secondary | ICD-10-CM

## 2023-09-28 DIAGNOSIS — I6523 Occlusion and stenosis of bilateral carotid arteries: Secondary | ICD-10-CM

## 2023-09-28 DIAGNOSIS — R42 Dizziness and giddiness: Secondary | ICD-10-CM

## 2023-09-28 DIAGNOSIS — I951 Orthostatic hypotension: Secondary | ICD-10-CM

## 2023-09-28 DIAGNOSIS — I25118 Atherosclerotic heart disease of native coronary artery with other forms of angina pectoris: Secondary | ICD-10-CM

## 2023-09-28 MED ORDER — METOPROLOL SUCCINATE ER 25 MG PO TB24: ORAL_TABLET | ORAL | 3 refills | Status: DC

## 2023-09-28 MED ORDER — HYDRALAZINE HCL 10 MG PO TABS: 10.0000 mg | ORAL_TABLET | Freq: Three times a day (TID) | ORAL | 3 refills | Status: DC

## 2023-09-28 MED ORDER — NITROGLYCERIN 0.4 MG SL SUBL
0.4000 mg | SUBLINGUAL_TABLET | SUBLINGUAL | 3 refills | Status: DC | PRN
Start: 2023-09-28 — End: 2024-08-18

## 2023-09-28 NOTE — Patient Instructions (Addendum)
Medication Instructions:  TAKE HYDRALAZINE 3 TIMES DAILY. TAKE THIS MEDICATION 8 HOURS APART.  DECREASE THE METOPROLOL TO 25MG  DAILY. TAKE THIS MEDICATION NIGHTLY.  RESUME THE ASPIRIN 81MG  DAILY.  MONITOR YOUR BLOOD PRESSURE 2 HOURS AFTER TAKING YOUR MEDICATION DAILY.  *If you need a refill on your cardiac medications before your next appointment, please call your pharmacy*   Lab Work: BMET, MAGNESIUM If you have labs (blood work) drawn today and your tests are completely normal, you will receive your results only by: MyChart Message (if you have MyChart) OR A paper copy in the mail If you have any lab test that is abnormal or we need to change your treatment, we will call you to review the results.   Testing/Procedures: Your physician has requested that you have a carotid duplex. This test is an ultrasound of the carotid arteries in your neck. It looks at blood flow through these arteries that supply the brain with blood. Allow one hour for this exam. There are no restrictions or special instructions.    Follow-Up: At [facility], you and your health needs are our priority.  As part of our continuing mission to provide you with exceptional heart care, we have created designated Provider Care Teams.  These Care Teams include your primary Cardiologist (physician) and Advanced Practice Providers (APPs -  Physician Assistants and Nurse Practitioners) who all work together to provide you with the care you need, when you need it.  We recommend signing up for the patient portal called "MyChart".  Sign up information is provided on this After Visit Summary.  MyChart is used to connect with patients for Virtual Visits (Telemedicine).  Patients are able to view lab/test results, encounter notes, upcoming appointments, etc.  Non-urgent messages can be sent to your provider as well.   To learn more about what you can do with MyChart, go to [email or web address].    Your next  appointment:   2-3 month(s)  Provider:   Konrad Felix WEST OR DR. O'NEAL

## 2023-09-29 ENCOUNTER — Telehealth: Payer: Self-pay

## 2023-09-29 LAB — BASIC METABOLIC PANEL
BUN/Creatinine Ratio: 31 — ABNORMAL HIGH (ref 10–24)
BUN: 36 mg/dL — ABNORMAL HIGH (ref 8–27)
CO2: 23 mmol/L (ref 20–29)
Calcium: 9.8 mg/dL (ref 8.6–10.2)
Chloride: 107 mmol/L — ABNORMAL HIGH (ref 96–106)
Creatinine, Ser: 1.15 mg/dL (ref 0.76–1.27)
Glucose: 102 mg/dL — ABNORMAL HIGH (ref 70–99)
Potassium: 4.5 mmol/L (ref 3.5–5.2)
Sodium: 146 mmol/L — ABNORMAL HIGH (ref 134–144)
eGFR: 64 mL/min/{1.73_m2} (ref >59)

## 2023-09-29 LAB — MAGNESIUM: Magnesium: 2.3 mg/dL (ref 1.6–2.3)

## 2023-09-29 NOTE — Telephone Encounter (Signed)
Left message to call back  

## 2023-09-29 NOTE — Telephone Encounter (Signed)
-----   Message from [facility] sent at 09/29/2023 10:59 AM EST ----- Please let Ricardo [name] know that his kidney function is normal, his labs indicate he may be mildly dehydrated, recommend staying hydrated with water. His potassium and magnesium are normal. Continue current medications and follow up as planned.

## 2023-09-30 NOTE — Telephone Encounter (Signed)
Left message to call back  

## 2023-10-02 NOTE — Telephone Encounter (Signed)
Sent letter

## 2023-10-13 DIAGNOSIS — C678 Malignant neoplasm of overlapping sites of bladder: Secondary | ICD-10-CM | POA: Diagnosis not present

## 2023-10-13 DIAGNOSIS — R3915 Urgency of urination: Secondary | ICD-10-CM | POA: Diagnosis not present

## 2023-10-14 ENCOUNTER — Ambulatory Visit (HOSPITAL_COMMUNITY)
Admission: RE | Admit: 2023-10-14 | Discharge: 2023-10-14 | Disposition: A | Discharge status: Home / Self Care | Payer: Medicare Other | Source: Ambulatory Visit | Attending: Cardiology | Admitting: Cardiology

## 2023-10-14 DIAGNOSIS — I6523 Occlusion and stenosis of bilateral carotid arteries: Secondary | ICD-10-CM | POA: Diagnosis not present

## 2023-10-19 ENCOUNTER — Telehealth: Payer: Self-pay

## 2023-10-19 NOTE — Telephone Encounter (Signed)
-----   Message from [facility] sent at 10/19/2023  8:25 AM EST ----- Please let Ricardo [name] know that the ultrasound of his carotid arteries shows mild plaque buildup which is unchanged from last year. Please continue current medications and follow up as planned. Good result!

## 2023-10-19 NOTE — Telephone Encounter (Signed)
Will keep Eye on his appointment for tommorrow

## 2023-10-20 ENCOUNTER — Telehealth: Payer: Self-pay | Admitting: Pharmacist

## 2023-10-20 ENCOUNTER — Ambulatory Visit: Payer: Medicare Other | Attending: Cardiology | Admitting: Pharmacist

## 2023-10-20 ENCOUNTER — Encounter: Payer: Self-pay | Admitting: Pharmacist

## 2023-10-20 VITALS — BP 163/79 | HR 60

## 2023-10-20 DIAGNOSIS — I10 Essential (primary) hypertension: Secondary | ICD-10-CM

## 2023-10-20 MED ORDER — CHLORTHALIDONE 25 MG PO TABS: 12.5000 mg | ORAL_TABLET | Freq: Every day | ORAL | 1 refills | Status: DC

## 2023-10-20 NOTE — Progress Notes (Signed)
Patient ID: Ricardo [name]                 DOB: 06/25/1941                      MRN: [id number]     HPI: Ricardo [name] is a 82 y.o. male referred by Reather Littler to HTN clinic. Patient of Dr Flora Lipps. PMH is significant for CAD, AAA, HTN, angina, and HLD.   Patient presents today with daughter. Had incident where he was working outside in the heat and BP dropped suddenly but has not had any other orthostatic episodes.  Medication list is not accurate. Patient brought medication bottles and is also wearing a clonidine patch. Medications include chlorthalidone which is also not on our list.  Currently patient has been taking chlorthalidone, hydralazine, isosorbide, metoprolol, telmisartan, and is wearing a clonidine patch. Takes all his medications in the morning.  Daughter and patient are both state that blood pressure was controlled when he was taking turmeric capsules.  Daughter is trying to get all of patient's providers under the [facility] umbrella.  Current HTN meds:  Metoprolol succinate 25mg  daily Telmisartan 80mg  daily Clonidine 0.2mg  patch weekly Isosorbide 60mg  daily (written for 90mg  daily) Hydralazine 10mg  TID Chlorthalidone 12.5mg  daily  BP goal: <130/80  Wt Readings from Last 3 Encounters:  09/28/23 167 lb 12.8 oz (76.1 kg)  05/05/23 174 lb (78.9 kg)  09/30/21 186 lb (84.4 kg)   BP Readings from Last 3 Encounters:  09/28/23 120/60  05/05/23 (!) 177/81  02/23/23 (!) 157/74   Pulse Readings from Last 3 Encounters:  09/28/23 (!) 50  05/05/23 65  02/23/23 (!) 47    Renal function: CrCl cannot be calculated (Patient's most recent lab result is older than the maximum 21 days allowed.).  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 Algie Coffer   Coronary artery disease CARDIOLOGIST- DR Donnie Aho-  LAST VISIT 2 WKS AGO-- WILL REQUEST NOTE AND EKG   STRESS TEST -- JULY 2009   Essential hypertension 08/30/2018   Hyperlipidemia    Hypertension    Nocturia    Personal history of malignant neoplasm of bladder 08/30/2018   Post PTCA 2001-   X1 STENT TO RCA   AND 1996    Current Outpatient Medications on File Prior to Visit  Medication Sig Dispense Refill   acetaminophen (TYLENOL) 500 MG tablet Take 1,000 mg by mouth daily as needed for headache (pain).     aspirin 81 MG tablet Take 1 tablet (81 mg total) by mouth daily. 1 tablet 0   Evolocumab (REPATHA SURECLICK) 140 MG/ML SOAJ Inject 140 mg into the skin See admin instructions. Inject 140 mg subcutaneously on the 1st and 15th of each month 2 mL 11   hydrALAZINE (APRESOLINE) 10 MG tablet Take 1 tablet (10 mg total) by mouth 3 (three) times daily. 180 tablet 3   isosorbide mononitrate (IMDUR) 60 MG 24 hr tablet TAKE 1 AND 1/2 TABLET BY MOUTH DAILY 135 tablet 0   metoprolol succinate (TOPROL XL)  25 MG 24 hr tablet TAKE ONE TABLET NIGHTLY 90 tablet 3   nitroGLYCERIN (NITROSTAT) 0.4 MG SL tablet Place 1 tablet (0.4 mg total) under the tongue every 5 (five) minutes as needed. 25 tablet 3   rosuvastatin (CRESTOR) 5 MG tablet Take 1 tablet (5 mg total) by mouth daily. 90 tablet 1   telmisartan (MICARDIS) 80 MG tablet Take 1 tablet (80 mg total) by mouth daily. 90 tablet 1   No current facility-administered medications on file prior to visit.    Allergies  Allergen Reactions   Clonidine Hcl Other (See Comments)    Reaction to 0.2 mg tabs - caused dry mouth, drowsiness, lightheadedness - reported 07/08/2020 (no reaction to patch)   Cyclobenzaprine Other (See Comments)    Numbness in lips   Norvasc [Amlodipine Besylate] Other (See Comments)    CAUSES TEETH TO  FALL OUT     Assessment/Plan:  1. Hypertension -  Patient BP in room 163/79 which is above goal of <130/80. Advised that typically turmeric does not lower BP readings however he was welcome to try again since he and daughter both state it was working.   Despite 6 antihypertensives, BP remains elevated. Will switch patients dosing schedule since he takes all meds in the morning. Will have patient take chlorthalidone and isosorbide in the morning, turmeric midday, and telmisartan and metoprolol in the evening. Continue clonidine patch weekly and hydralazine TID. Continue to monitor home BP and follow up with [facility] in 4 weeks.  Continue  Chlorthalidone 12.5mg  once daily Hydralazine 10mg  daily Isosorbide 60mg  daily Telmisartan 80mg  in evening Metoprolol 25mg  in evening Clonidine 0.2mg  patch weekly F/U in 4 weeks  Laural Golden, PharmD, BCACP, CDCES, CPP [address] [place], [place], [address] Phone: [phone], Fax: [phone]

## 2023-10-20 NOTE — Telephone Encounter (Signed)
Patient left bottle of Myrbetriq in exam room. Attempted to call patient but goes right to voicemail and mailbox is full. Sent myChart message.

## 2023-10-20 NOTE — Patient Instructions (Addendum)
It was nice meeting you two today  We would like your blood pressure to be less than 130/80  In the morning: Continue chlorthalidone 12.5mg  once daily Hydralazine 10mg  daily Isosorbide 60mg  daily  In Midday: Take your turmeric  In the Evening Continue telmisartan 80mg  in the evening Metoprolol 25mg   Hydralazine 10mg    Continue your clonidine patch once weekly  Continue to monitor your blood pressure at home and contact us with any questions  Laural Golden, PharmD, BCACP, CDCES, CPP [address], [address] [place], [place], [address] Phone: [phone], Fax: [phone]

## 2023-10-21 NOTE — Telephone Encounter (Signed)
Sending letter

## 2023-10-23 NOTE — Telephone Encounter (Signed)
-----   Message from [facility] sent at 10/19/2023  8:25 AM EST ----- Please let Ricardo [name] know that the ultrasound of his carotid arteries shows mild plaque buildup which is unchanged from last year. Please continue current medications and follow up as planned. Good result!

## 2023-10-23 NOTE — Telephone Encounter (Signed)
Sending letter to patient will check in later

## 2023-10-26 DIAGNOSIS — I77819 Aortic ectasia, unspecified site: Secondary | ICD-10-CM | POA: Diagnosis not present

## 2023-10-26 DIAGNOSIS — Z23 Encounter for immunization: Secondary | ICD-10-CM | POA: Diagnosis not present

## 2023-10-26 DIAGNOSIS — Z1159 Encounter for screening for other viral diseases: Secondary | ICD-10-CM | POA: Diagnosis not present

## 2023-10-26 DIAGNOSIS — G4700 Insomnia, unspecified: Secondary | ICD-10-CM | POA: Diagnosis not present

## 2023-10-26 DIAGNOSIS — Z0000 Encounter for general adult medical examination without abnormal findings: Secondary | ICD-10-CM | POA: Diagnosis not present

## 2023-10-26 DIAGNOSIS — I129 Hypertensive chronic kidney disease with stage 1 through stage 4 chronic kidney disease, or unspecified chronic kidney disease: Secondary | ICD-10-CM | POA: Diagnosis not present

## 2023-10-26 DIAGNOSIS — I25119 Atherosclerotic heart disease of native coronary artery with unspecified angina pectoris: Secondary | ICD-10-CM | POA: Diagnosis not present

## 2023-10-26 DIAGNOSIS — N1831 Chronic kidney disease, stage 3a: Secondary | ICD-10-CM | POA: Diagnosis not present

## 2023-10-26 DIAGNOSIS — Z8551 Personal history of malignant neoplasm of bladder: Secondary | ICD-10-CM | POA: Diagnosis not present

## 2023-10-26 DIAGNOSIS — D696 Thrombocytopenia, unspecified: Secondary | ICD-10-CM | POA: Diagnosis not present

## 2023-10-26 DIAGNOSIS — E782 Mixed hyperlipidemia: Secondary | ICD-10-CM | POA: Diagnosis not present

## 2023-11-12 IMAGING — CR DG SHOULDER 2+V*L*
3 series · 3 of 3 positions shown · non-contrast
Comparison: 02/28/2016.

CLINICAL DATA: Chronic syndrome.  Bilateral shoulder pain.

EXAM:
LEFT SHOULDER - 2+ VIEW

[w shoulder grashey left]
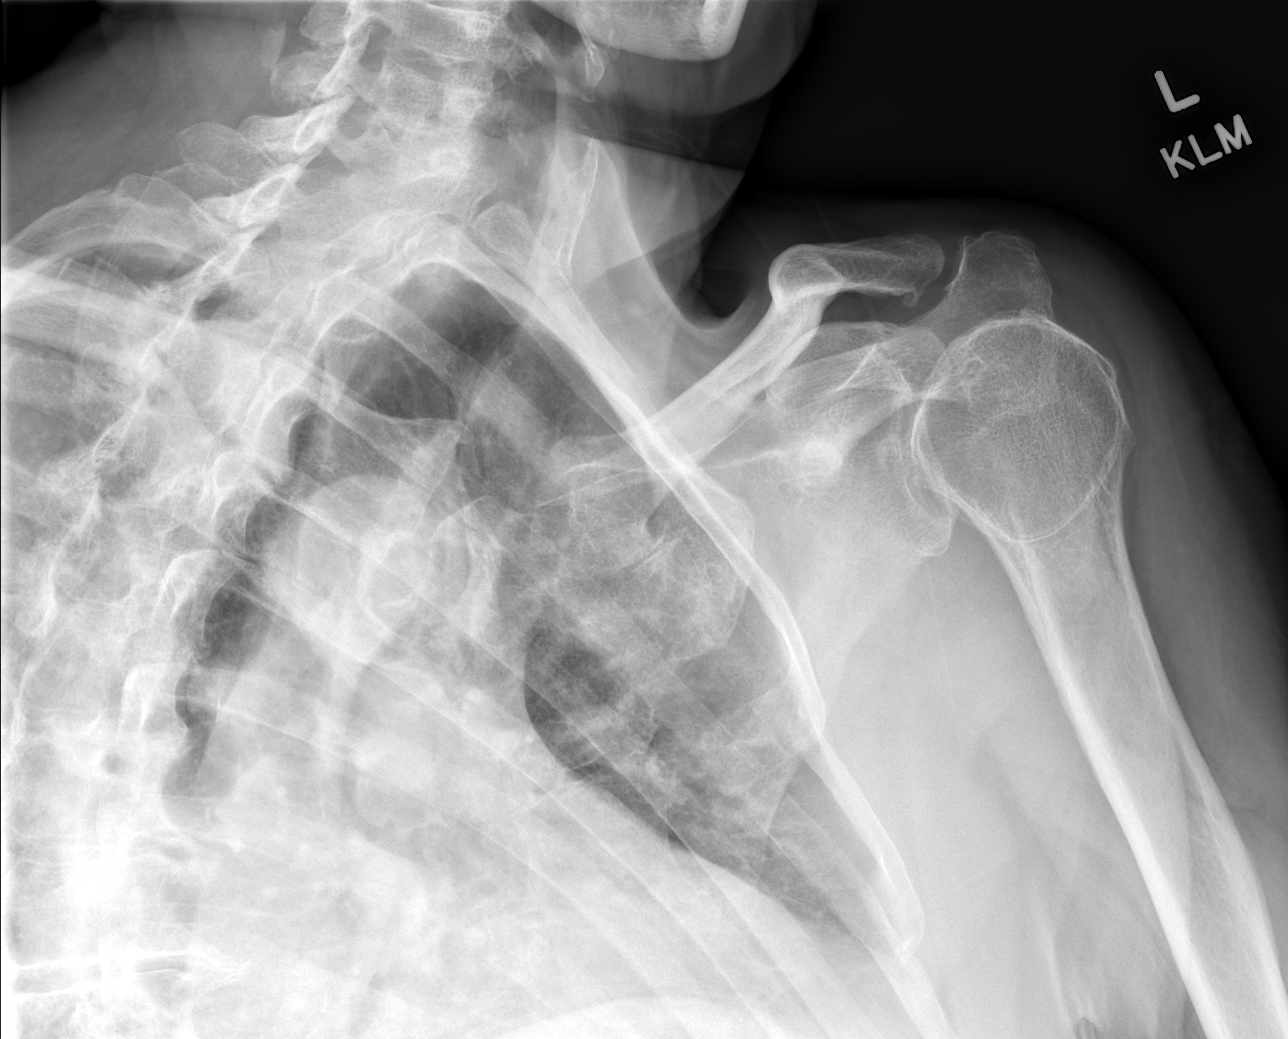

[w shoulder y-view left]
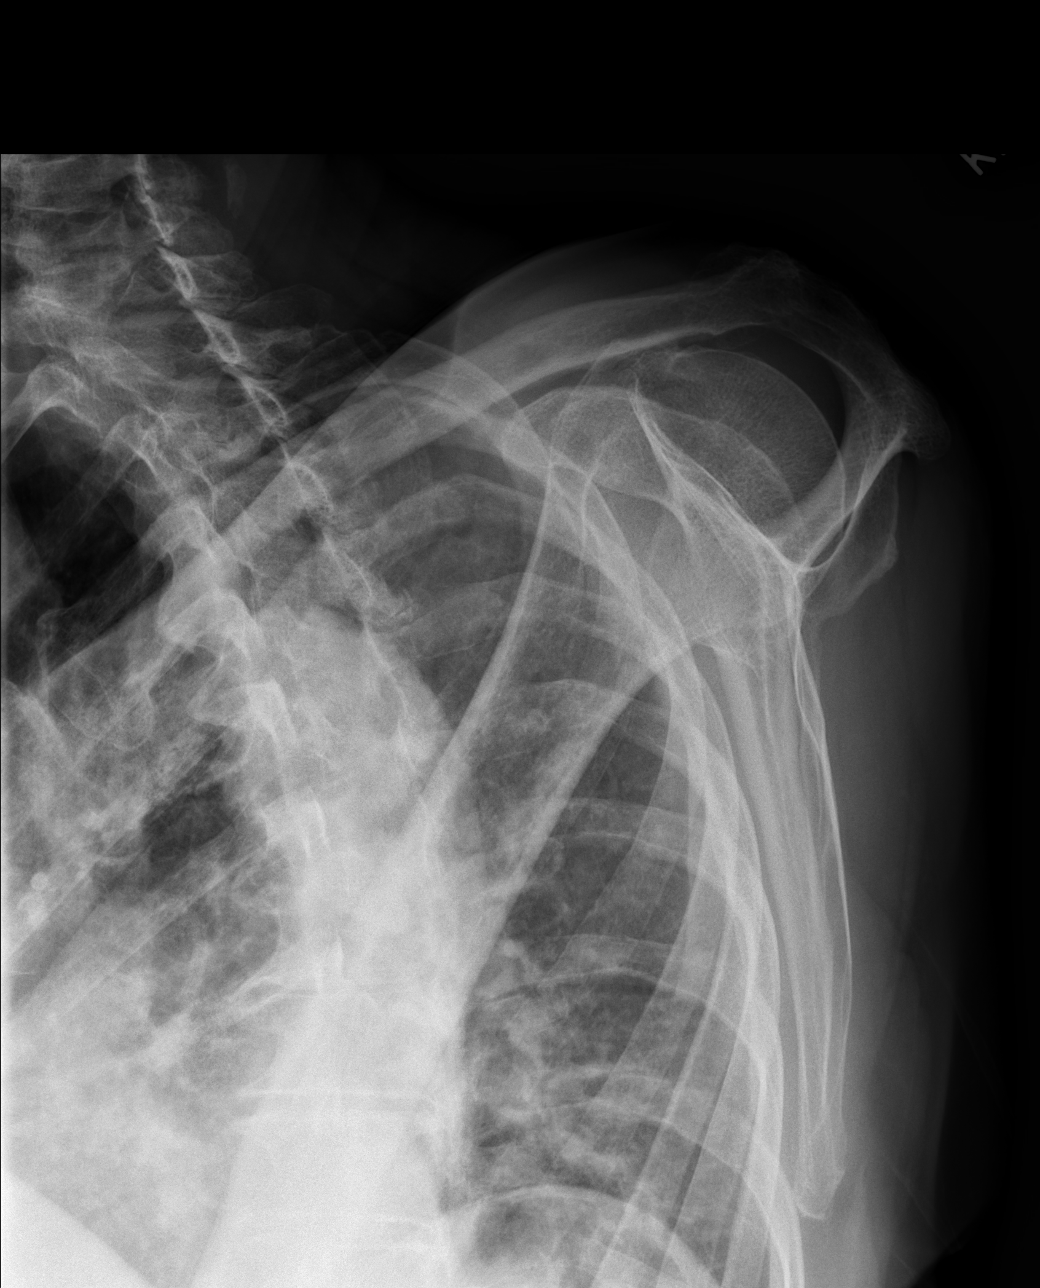

[x shoulder axillary left]
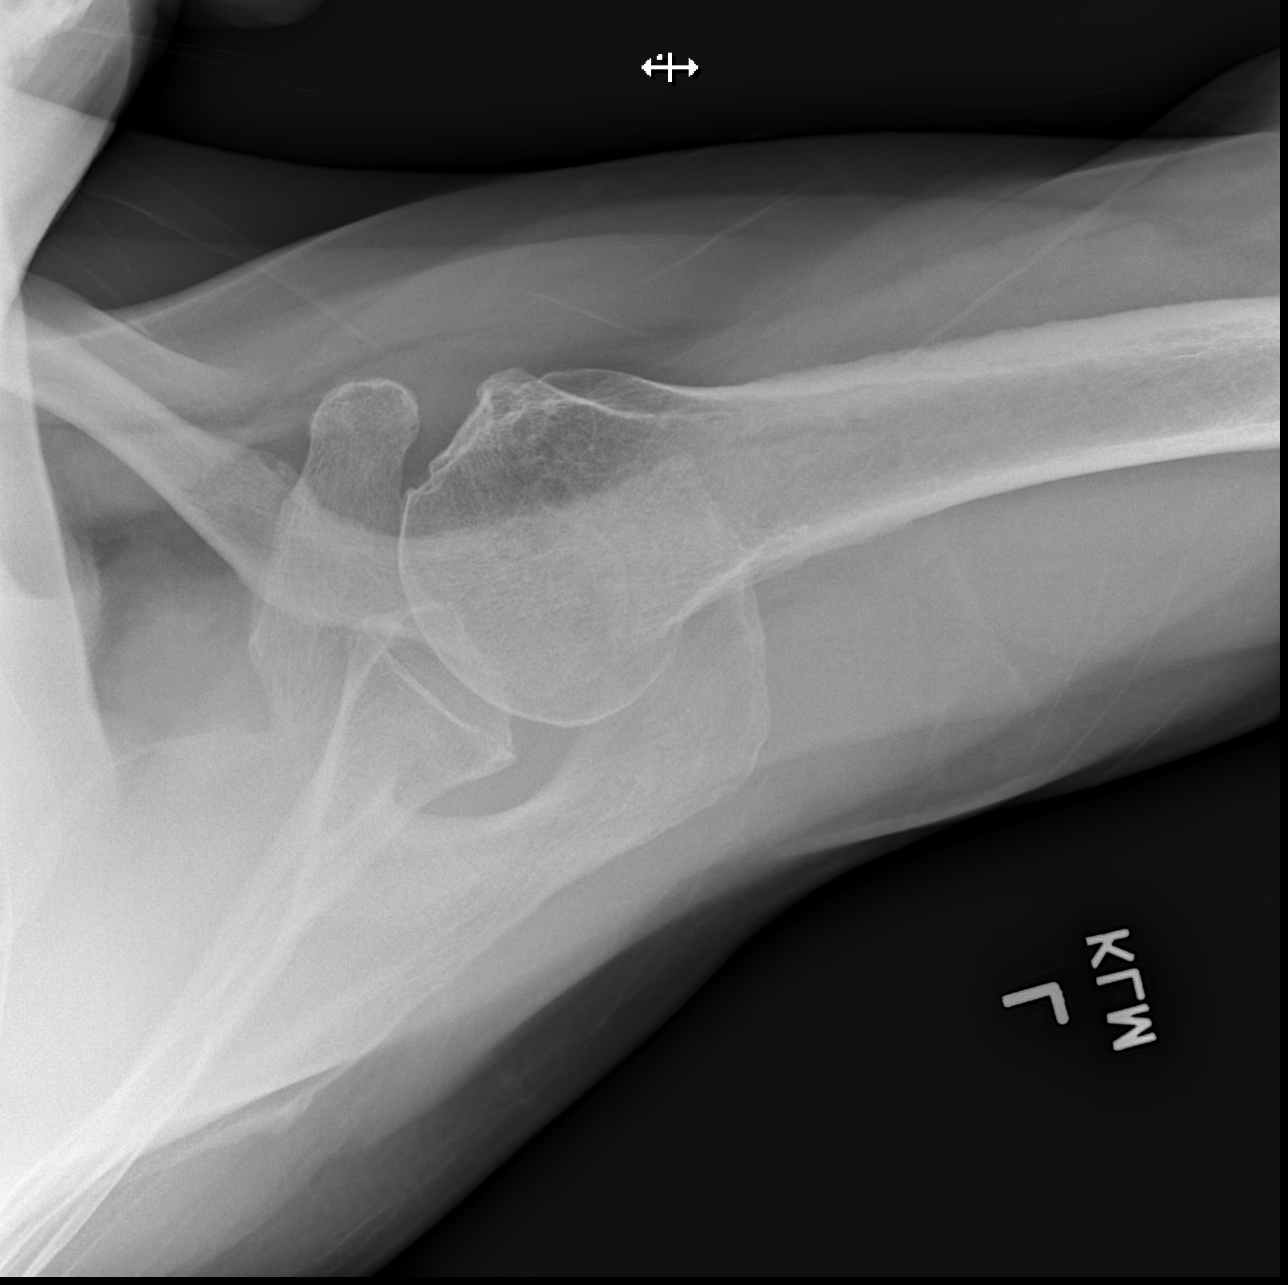

[3 of 3 positions shown; findings below may reference images not displayed]

FINDINGS: There is diffuse decreased bone mineralization. Moderate
glenohumeral joint space narrowing. Mild acromioclavicular joint
space narrowing with mild-to-moderate inferior spurring. No acute
fracture is seen. No dislocation.
IMPRESSION: Moderate glenohumeral and mild-to-moderate acromioclavicular
osteoarthritis.

## 2023-11-12 IMAGING — CR DG SHOULDER 2+V*R*
3 series · 3 of 3 positions shown · non-contrast
Comparison: None.

CLINICAL DATA: Bilateral shoulder pain.

EXAM:
RIGHT SHOULDER - 2+ VIEW

[w shoulder grashey right]
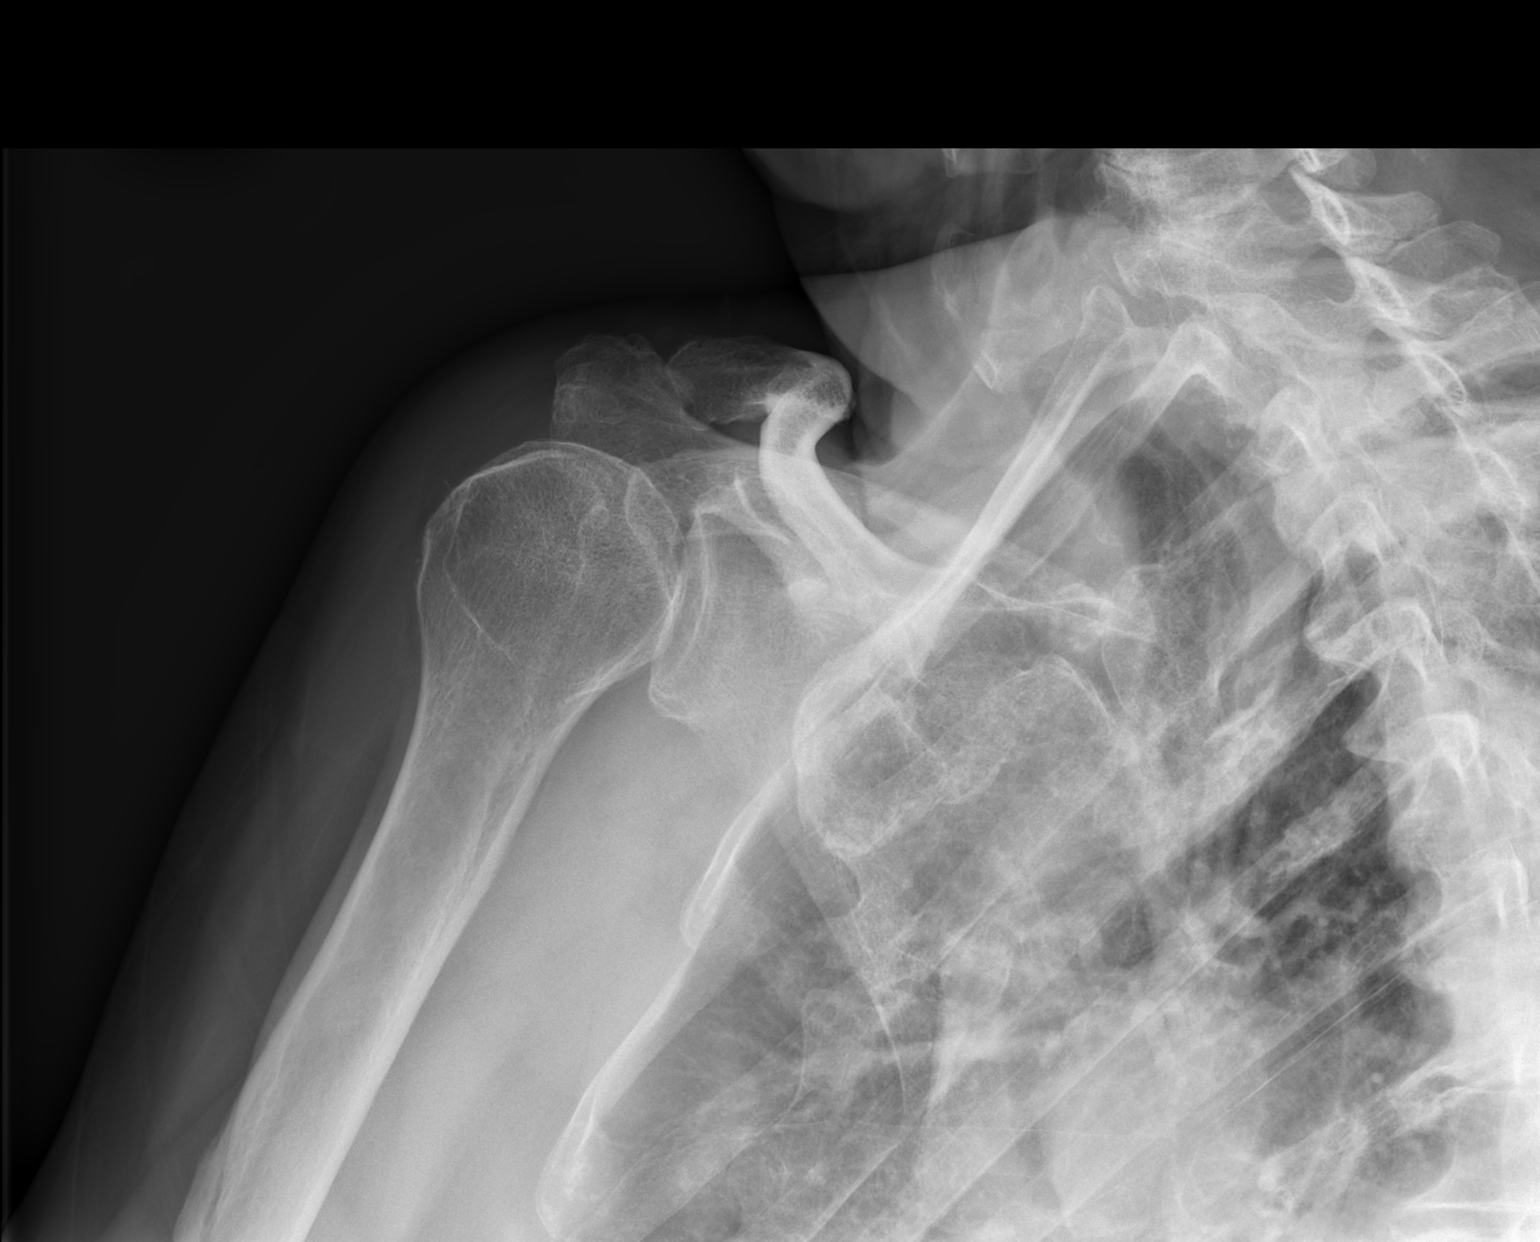

[w shoulder y-view right]
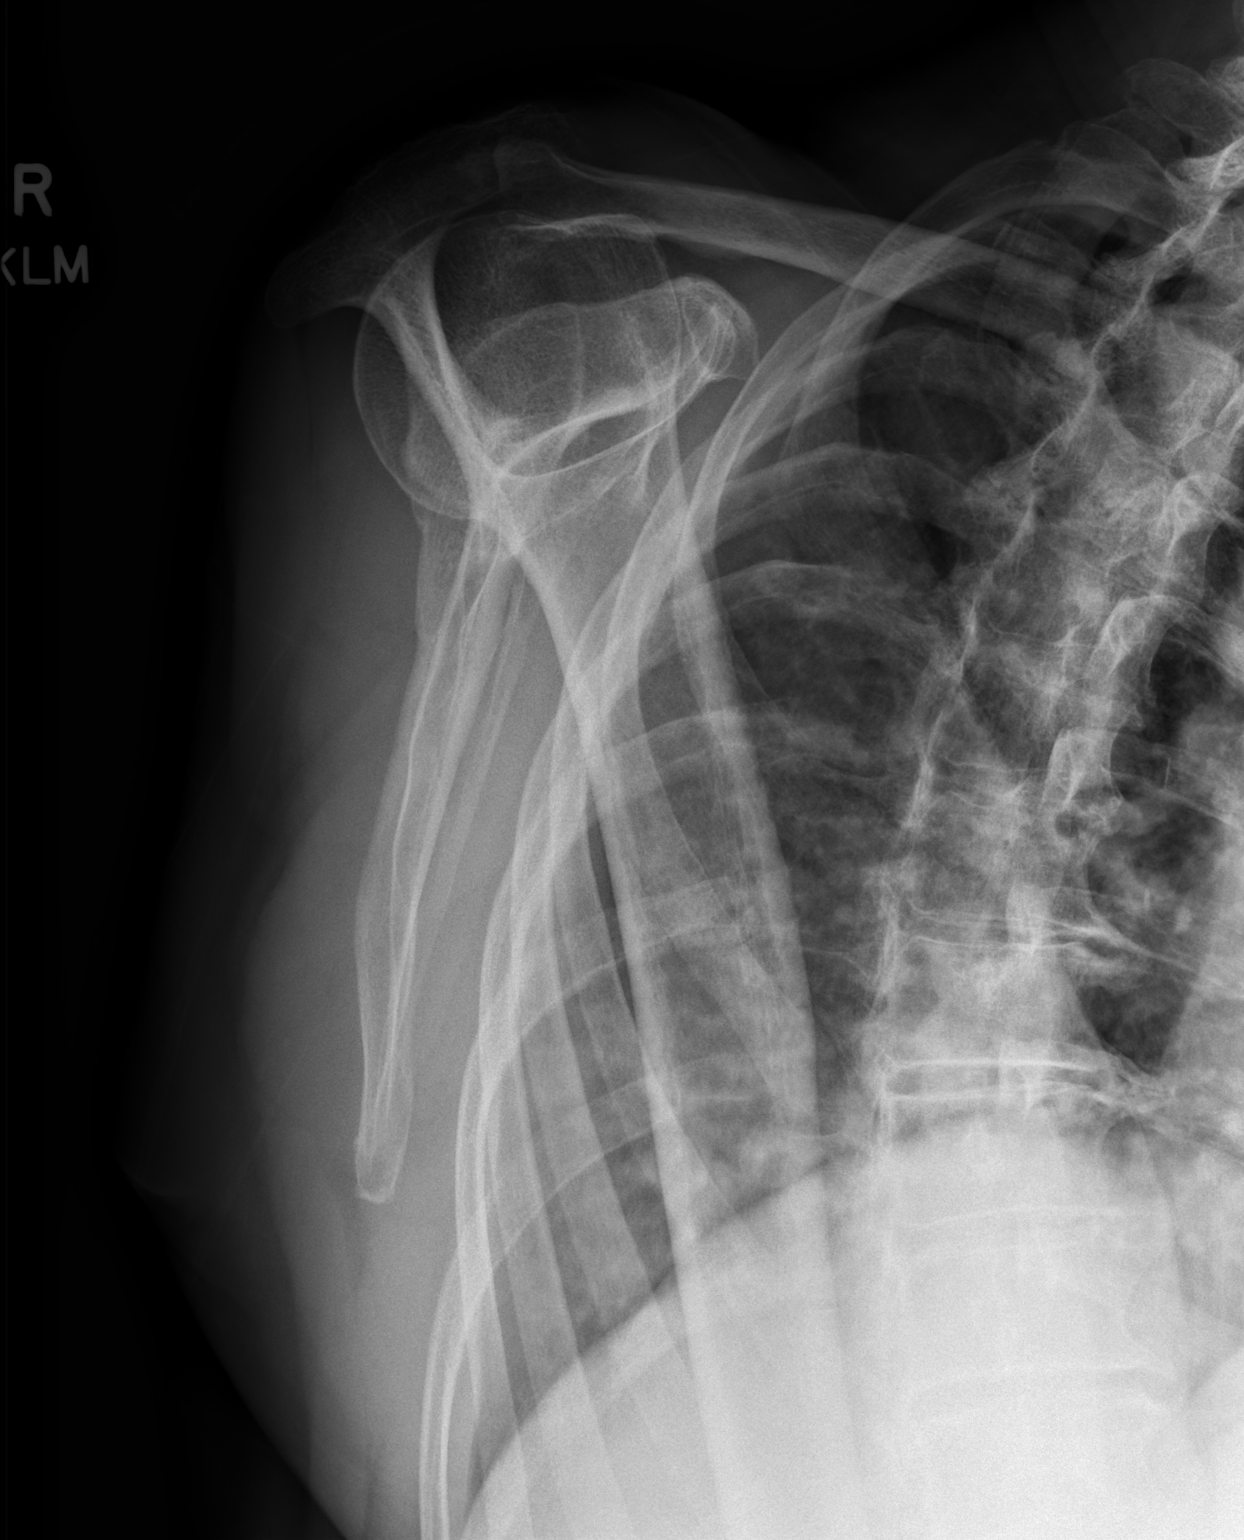

[x shoulder axillary right]
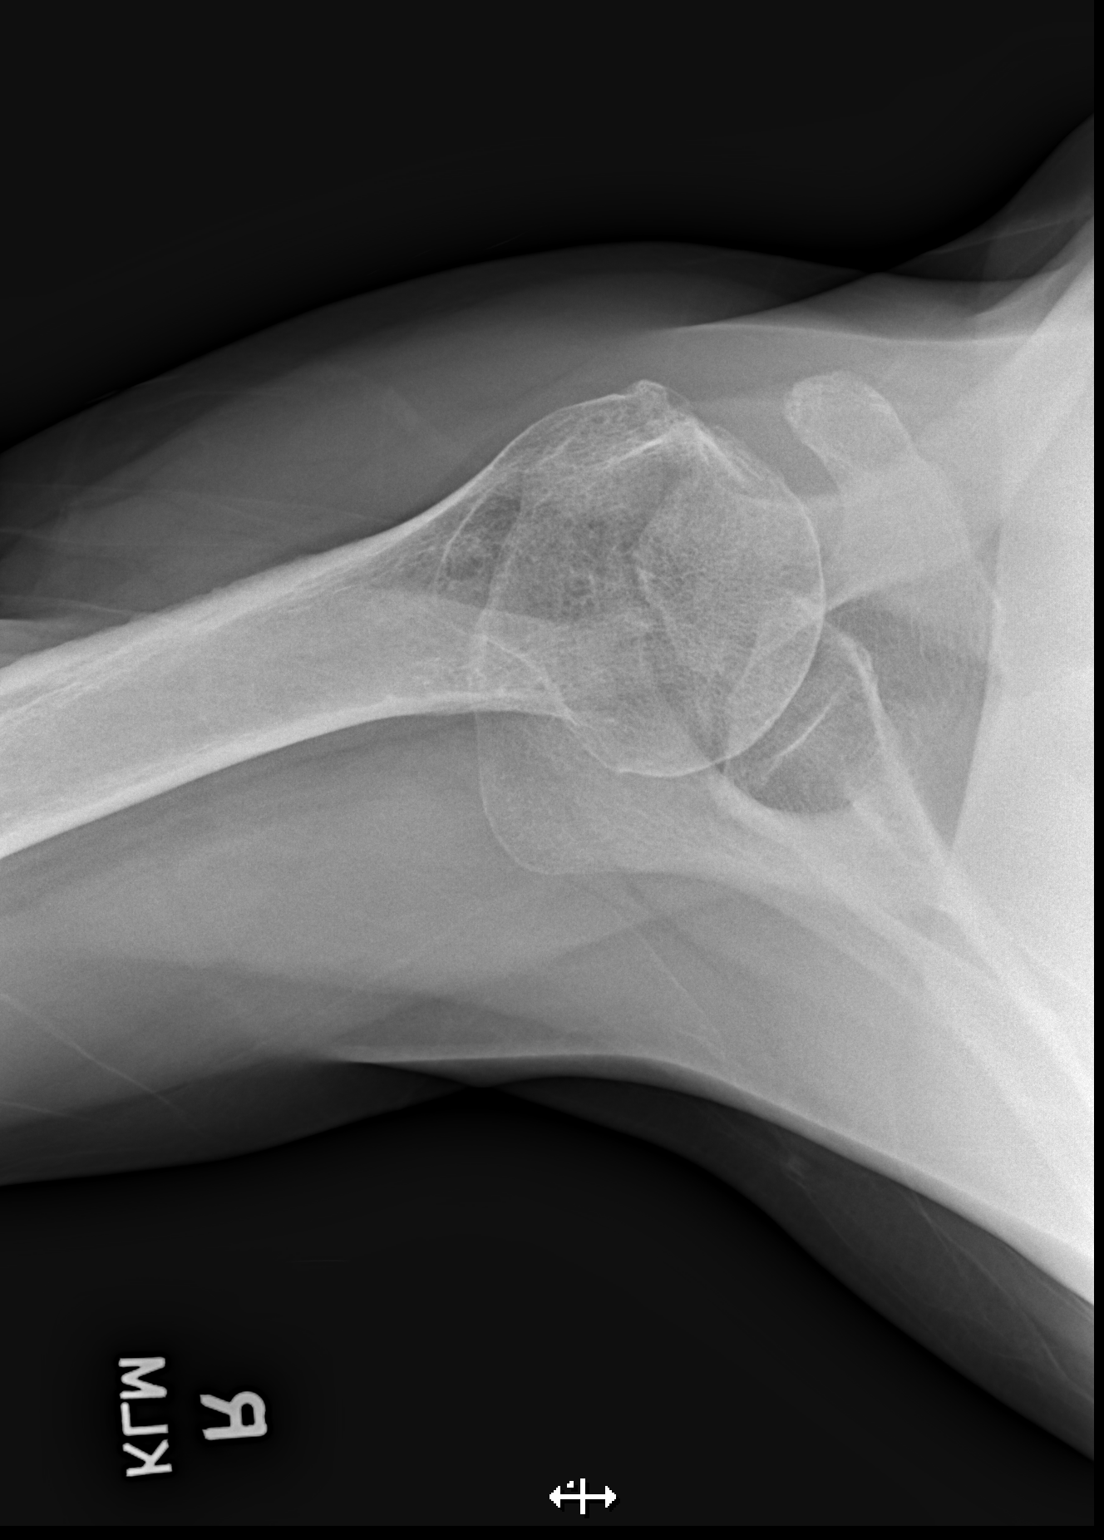

[3 of 3 positions shown; findings below may reference images not displayed]

FINDINGS: There is diffuse decreased bone mineralization. Mild-to-moderate
glenohumeral joint space narrowing. Moderate acromioclavicular joint
space narrowing with mild peripheral degenerative osteophytosis. No
acute fracture is seen. No dislocation. The visualized portion of
the right lung is unremarkable.
IMPRESSION: Mild-to-moderate glenohumeral and acromioclavicular osteoarthritis.

## 2023-11-29 NOTE — Progress Notes (Signed)
 Cardiology Office Note    Date:  12/01/2023  ID:  Ricardo [name] [birth year], MRN [id number] PCP:  Seabron Lenis, MD  Cardiologist:  Darryle ONEIDA Decent, MD  Electrophysiologist:  None   Chief Complaint: Follow up for hypertension   History of Present Illness: .    Ricardo [name] is a 83 y.o. male with visit-pertinent history of CAD, hyperlipidemia, hypertension, AAA, orthostatic hypotension, carotid artery disease.   History of RCA stent in 2001.  He was admitted in August 2021 for unstable angina, was found to have mild to moderate nonobstructive CAD.  He was admitted in 04/2021 due to chest pain, hypertensive emergency and bradycardia.  Echo with normal LVEF, grade 1 DD, no RWMA.  His hydralazine  was increased to 50 mg 3 times daily, it was felt his chest pain was due to hypertension and his bradycardia resolved.  When seen in follow-up the following month he reported significant lightheaded and dizziness.  Blood pressure was labile at home from 86/40 to 150/90.  The timings of his medications were adjusted.  His Imdur  was later increased to 90 mg daily for increased BP control and was also recommended that he wear a ZIO monitor.  His ZIO monitor showed predominantly normal sinus rhythm and average heart rate of 63 bpm, 12 SVT runs longest 13 seconds which were asymptomatic and rare PACs and PVCs.  Triggered episodes were associated with sinus rhythm.    On follow-up in 06/2021 he reported episodes of chest pain, flushing and occasional orthostatic symptoms.  His hydralazine  was reduced to 25 mg 3 times daily and chlorthalidone  was increased to 25 mg daily.  He underwent Lexiscan  which was a low restudy with no evidence of ischemia.   He was last seen in clinic on 09/28/2023 for follow-up.  He reported that he had been doing well.  He reported concerns regarding low blood pressure, he reported that the week prior to office visit he had been outside power washing and scrubbing his driveway, he  became dizzy.  He checked his blood pressure and was reported at 56/43.  His hydralazine  was decreased to 10 mg 3 times a day and metoprolol  succinate 25 mg nightly.  He was seen on 10/20/2023 by Pharm.D.  The scheduling of his medications was adjusted.  He was continued on chlorthalidone  12.5 mg once daily, hydralazine  10 mg daily, isosorbide  60 mg daily, telmisartan  80 mg in the evening, metoprolol  25 mg in the evening, clonidine  0.2 mg patch weekly.  Today he presents for follow-up.  He reports that he is doing very well overall.  He denies any episodes of presyncope.  He denies chest pain, shortness of breath, lower extremity edema, palpitations or syncope.  He reports that he remains very active.  He has not been routinely monitoring his blood pressure at home.  Labwork independently reviewed: 09/28/2023: Sodium 146, potassium 4.5, creatinine 1.15, magnesium 2.3  ROS: .   Today he denies chest pain, shortness of breath, lower extremity edema, fatigue, palpitations, melena, hematuria, hemoptysis, diaphoresis, weakness, presyncope, syncope, orthopnea, and PND.  All other systems are reviewed and otherwise negative.  Studies Reviewed: SABRA    EKG:  EKG is not ordered today.   CV Studies:  Cardiac Studies & Procedures   CARDIAC CATHETERIZATION  CARDIAC CATHETERIZATION 07/09/2020  Narrative  Ost LAD to Prox LAD lesion is 50% stenosed.  Mid LAD to Dist LAD lesion is 50% stenosed.  Ost RCA lesion is 40% stenosed.  Prox RCA lesion  is 40% stenosed.  Mid RCA lesion is 40% stenosed.  2nd Diag lesion is 50% stenosed.  2nd Mrg lesion is 60% stenosed.  Mid Cx lesion is 30% stenosed.  Mild to moderate multivessel disease. Add isosorbide  and ASA and Plavix  for 6 months and life style modification.  Findings Coronary Findings Diagnostic  Dominance: Right  Left Main Vessel was injected. Vessel is normal in caliber. Vessel is angiographically normal.  Left Anterior Descending Vessel  was injected. Vessel is normal in caliber. Ost LAD to Prox LAD lesion is 50% stenosed. Vessel is not the culprit lesion. The lesion is type A, focal and concentric. The lesion was not previously treated. The stenosis was measured by a visual reading. Pressure wire/FFR was not performed on the lesion. IVUS was not performed. Mid LAD to Dist LAD lesion is 50% stenosed. Vessel is not the culprit lesion. The lesion is type C, tubular and concentric. The lesion was not previously treated. The stenosis was measured by a visual reading. Pressure wire/FFR was not performed on the lesion. IVUS was not performed.  Second Diagonal Branch 2nd Diag lesion is 50% stenosed. Vessel is not the culprit lesion. The lesion is type C and concentric. The lesion was not previously treated. The stenosis was measured by a visual reading. Pressure wire/FFR was not performed on the lesion. IVUS was not performed.  Left Circumflex Vessel was injected. Vessel is very large. Mid Cx lesion is 30% stenosed. Vessel is not the culprit lesion. The lesion is type A and concentric. The lesion was not previously treated. The stenosis was measured by a visual reading. Pressure wire/FFR was not performed on the lesion. IVUS was not performed.  Second Obtuse Marginal Branch 2nd Mrg lesion is 60% stenosed. Vessel is not the culprit lesion. The lesion is type C and concentric. The lesion was not previously treated. The stenosis was measured by a visual reading. Pressure wire/FFR was not performed on the lesion. IVUS was not performed.  Right Coronary Artery Vessel was injected. Vessel is moderate in size. Ost RCA lesion is 40% stenosed. Vessel is not the culprit lesion. The lesion is type A and concentric. The lesion is mildly calcified. The lesion was not previously treated. The stenosis was measured by a visual reading. Pressure wire/FFR was not performed on the lesion. IVUS was not performed. Significant spasm, relived with IC nitro 200  mcg. Prox RCA lesion is 40% stenosed. Vessel is not the culprit lesion. The lesion is type A and concentric. The lesion was not previously treated. The stenosis was measured by a visual reading. Pressure wire/FFR was not performed on the lesion. IVUS was not performed. Mid RCA lesion is 40% stenosed. Vessel is not the culprit lesion. The lesion is type A and concentric. The lesion was not previously treated. The stenosis was measured by a visual reading. Pressure wire/FFR was not performed on the lesion. IVUS was not performed.  Intervention  No interventions have been documented.   STRESS TESTS  MYOCARDIAL PERFUSION IMAGING 08/16/2021  Narrative   Findings are consistent with no prior ischemia and no prior myocardial infarction. The study is intermediate risk due to reduced systolic function.   No ST deviation was noted.   LV perfusion is normal. There is no evidence of ischemia. There is no evidence of infarction.   Left ventricular function is abnormal. Global function is mildly reduced. There were no regional wall motion abnormalities. Nuclear stress EF: 49 %. The left ventricular ejection fraction is mildly decreased (45-54%).  End diastolic cavity size is normal. End systolic cavity size is normal.   Prior study not available for comparison.  Negative stress test.  ECHOCARDIOGRAM  ECHOCARDIOGRAM COMPLETE 05/12/2021  Narrative ECHOCARDIOGRAM REPORT    Patient Name:   Mitsugi A [facility] Date of Exam: 05/12/2021 Medical Rec #:  [id number]      Height:       69.0 in Accession #:    [id number]     Weight:       169.6 lb Date of Birth:  1941-01-23      BSA:          1.926 m Patient Age:    80 years       BP:           125/52 mmHg Patient Gender: M              HR:           61 bpm. Exam Location:  Inpatient  Procedure: 2D Echo, Cardiac Doppler and Color Doppler  Indications:    R07.9* Chest pain, unspecified  History:        Patient has prior history of Echocardiogram examinations,  most recent 07/08/2020. CAD; Risk Factors:Hypertension and Dyslipidemia. Cancer.  Sonographer:    Tiffany Dance Referring Phys: [id number] RAVI PAHWANI   Sonographer Comments: Suboptimal subcostal window. IMPRESSIONS   1. Left ventricular ejection fraction, by estimation, is 60 to 65%. The left ventricle has normal function. The left ventricle has no regional wall motion abnormalities. There is mild concentric left ventricular hypertrophy. Left ventricular diastolic parameters are consistent with Grade I diastolic dysfunction (impaired relaxation). 2. Right ventricular systolic function is normal. The right ventricular size is normal. Tricuspid regurgitation signal is inadequate for assessing PA pressure. 3. The mitral valve is grossly normal. Trivial mitral valve regurgitation. No evidence of mitral stenosis. 4. The aortic valve is tricuspid. Aortic valve regurgitation is trivial. No aortic stenosis is present.  FINDINGS Left Ventricle: Left ventricular ejection fraction, by estimation, is 60 to 65%. The left ventricle has normal function. The left ventricle has no regional wall motion abnormalities. The left ventricular internal cavity size was normal in size. There is mild concentric left ventricular hypertrophy. Left ventricular diastolic parameters are consistent with Grade I diastolic dysfunction (impaired relaxation).  Right Ventricle: The right ventricular size is normal. No increase in right ventricular wall thickness. Right ventricular systolic function is normal. Tricuspid regurgitation signal is inadequate for assessing PA pressure.  Left Atrium: Left atrial size was normal in size.  Right Atrium: Right atrial size was normal in size.  Pericardium: There is no evidence of pericardial effusion.  Mitral Valve: The mitral valve is grossly normal. Trivial mitral valve regurgitation. No evidence of mitral valve stenosis.  Tricuspid Valve: The tricuspid valve is grossly normal.  Tricuspid valve regurgitation is trivial. No evidence of tricuspid stenosis.  Aortic Valve: The aortic valve is tricuspid. Aortic valve regurgitation is trivial. No aortic stenosis is present.  Pulmonic Valve: The pulmonic valve was grossly normal. Pulmonic valve regurgitation is not visualized. No evidence of pulmonic stenosis.  Aorta: The aortic root and ascending aorta are structurally normal, with no evidence of dilitation.  Venous: The inferior vena cava was not well visualized.  IAS/Shunts: The atrial septum is grossly normal.   LEFT VENTRICLE PLAX 2D LVIDd:         3.30 cm  Diastology LVIDs:         2.60 cm  LV e' medial:  6.64 cm/s LV PW:         1.30 cm  LV E/e' medial:  8.7 LV IVS:        1.20 cm  LV e' lateral:   9.68 cm/s LVOT diam:     1.90 cm  LV E/e' lateral: 5.9 LV SV:         66 LV SV Index:   34 LVOT Area:     2.84 cm   RIGHT VENTRICLE RV Basal diam:  2.20 cm RV S prime:     11.30 cm/s TAPSE (M-mode): 2.0 cm  LEFT ATRIUM             Index       RIGHT ATRIUM           Index LA diam:        4.30 cm 2.23 cm/m  RA Area:     11.80 cm LA Vol (A2C):   74.8 ml 38.83 ml/m RA Volume:   23.50 ml  12.20 ml/m LA Vol (A4C):   44.7 ml 23.21 ml/m LA Biplane Vol: 58.4 ml 30.32 ml/m AORTIC VALVE LVOT Vmax:   98.50 cm/s LVOT Vmean:  63.700 cm/s LVOT VTI:    0.232 m  AORTA Ao Root diam: 3.30 cm Ao Asc diam:  3.30 cm  MITRAL VALVE MV Area (PHT): 2.73 cm    SHUNTS MV Decel Time: 278 msec    Systemic VTI:  0.23 m MV E velocity: 57.50 cm/s  Systemic Diam: 1.90 cm MV A velocity: 97.30 cm/s MV E/A ratio:  0.59  Darryle Decent MD Electronically signed by Darryle Decent MD Signature Date/Time: 05/12/2021/10:05:24 AM    Final   MONITORS  LONG TERM MONITOR (3-14 DAYS) 08/05/2021  Narrative Enrollment 07/12/2021-07/26/2021. Patient had a min HR of 38 bpm (sinus bradycardia), max HR of 171 bpm (supraventricular tachycardia, 2.6 second duration), and avg HR of 63  bpm (normal sinus rhythm). Predominant underlying rhythm was Sinus Rhythm. 12 Supraventricular Tachycardia runs occurred, the run with the fastest interval lasting 5 beats (2.6 second duration) with a max rate of 171 bpm, the longest lasting 13.0 secs (25 beats) with an avg rate of 119 bpm. Isolated SVEs were rare (<1.0%), SVE Couplets were rare (<1.0%), and SVE Triplets were rare (<1.0%). Isolated VEs were rare (<1.0%), VE Couplets were rare (<1.0%), and no VE Triplets were present. Ventricular Bigeminy was present.  07/12/21 09:17pm lightheaded coincided with normal sinus rhythm 64 bpm. 07/13/21 03:44pm Hot flash/flushed coincided with normal sinus rhythm 67 bpm. 07/14/21 12:09am chest pain/pressure coincided with normal sinus rhythm 68 bpm. 07/14/21 12:21am left face pain coincided with normal sinus rhythm 64 bpm. 07/15/21 09:00am head throbbing, pounding coincided with normal sinus rhythm 74 bpm.  Impression: 1. No significant arrhythmias. 2. Rare ectopy.  Darryle T. Decent, MD, [facility]  [facility] [address] [place], [place] [address] [phone] 8:04 AM            Current Reported Medications:.    Current Meds  Medication Sig   acetaminophen  (TYLENOL ) 500 MG tablet Take 1,000 mg by mouth daily as needed for headache (pain).   aspirin  81 MG tablet Take 1 tablet (81 mg total) by mouth daily.   chlorthalidone  (HYGROTON ) 25 MG tablet Take 0.5 tablets (12.5 mg total) by mouth daily.   cloNIDine  (CATAPRES  - DOSED IN MG/24 HR) 0.2 mg/24hr patch Place 0.2 mg onto the skin once a week.   hydrALAZINE  (APRESOLINE ) 10 MG tablet Take  1 tablet (10 mg total) by mouth 3 (three) times daily.   isosorbide  mononitrate (IMDUR ) 60 MG 24 hr tablet TAKE 1 AND 1/2 TABLET BY MOUTH DAILY (Patient taking differently: Take 60 mg by mouth daily.)   metoprolol  succinate (TOPROL  XL) 25 MG 24 hr tablet TAKE ONE TABLET NIGHTLY   MYRBETRIQ 25 MG TB24 tablet Take 25 mg by mouth daily.    nitroGLYCERIN  (NITROSTAT ) 0.4 MG SL tablet Place 1 tablet (0.4 mg total) under the tongue every 5 (five) minutes as needed.   rosuvastatin  (CRESTOR ) 5 MG tablet Take 1 tablet (5 mg total) by mouth daily.   telmisartan  (MICARDIS ) 80 MG tablet Take 1 tablet (80 mg total) by mouth daily.   Physical Exam:    VS:  BP 136/68   Pulse (!) 54   Ht 5' 5 (1.651 m)   Wt 177 lb 3.2 oz (80.4 kg)   SpO2 96%   BMI 29.49 kg/m    Wt Readings from Last 3 Encounters:  12/01/23 177 lb 3.2 oz (80.4 kg)  09/28/23 167 lb 12.8 oz (76.1 kg)  05/05/23 174 lb (78.9 kg)    GEN: Well nourished, well developed in no acute distress NECK: No JVD; No carotid bruits CARDIAC: RRR, no murmurs, rubs, gallops RESPIRATORY:  Clear to auscultation without rales, wheezing or rhonchi  ABDOMEN: Soft, non-tender, non-distended EXTREMITIES:  No edema; No acute deformity   Asessement and Plan:.    CAD: History of stent to the RCA in 2001.  LHC in 06/2020 showed mild to moderate nonobstructive CAD.  Lexiscan  Myoview  in 07/2021 with no ischemia. Stable with no anginal symptoms. No indication for ischemic evaluation.  Reviewed ED precautions. Continue aspirin  81 mg daily, chlorthalidone , clonidine , hydralazine , Imdur , metoprolol  succinate, rosuvastatin , telmisartan  and Repatha .  HTN/Orthostatic hypotension/lightheadedness: Initial blood pressure today 144/62, on recheck was 136/68. Patient previously reported that in November 2024 he had an episode of hypotension.  Today he denies any further episodes of hypotension or presyncope.  He denies syncope.  He has not been routinely monitoring his blood pressure at home.  He has 3 blood pressure readings that he reports were completely random timings ranging from 112/55 to 169/74.  He reports that he is only been taking his hydralazine  10 mg twice a day.  Will have him increase back to hydralazine  10 mg 3 times a day.  Continue metoprolol  succinate 25 mg daily, telmisartan  80 mg daily,  clonidine  0.2 mg patch weekly, isosorbide  60 mg daily and chlorthalidone  12.5 mg daily. Will have him monitor his blood pressure at home for the next two weeks and record on BP log, he will notify the office if consistently elevated above 130/80  HLD: Last lipid profile on 04/24/2023 indicated total cholesterol 127, triglycerides 169, HDL 44 and LDL 55.  On rosuvastatin  5 mg daily and Repatha .  Patient notes that he was previously prescribed Repatha  by his PCP.  They also enrolled him in assistance program that has recently expired.  Repatha  reordered today, work with Pharm.D. and he has been re enrolled in [employer].   AAA: Most recent ultrasound shows a 4.2 cm abdominal aortic aneurysm, he is now being followed by Dr. Gretta with [facility].  He plans for him to have a repeat ultrasound in 1 year with follow-up.  Carotid artery disease: Last carotid duplex on 10/14/2023 showed right ICA consistent with a 1 to 39% stenosis, left carotid vessels were near normal with only minimal wall thickening or plaque.   Disposition: F/u  with Dr. Barbaraann in 6 weeks.   Signed, Ira Busbin D Irving Bloor, NP

## 2023-12-01 ENCOUNTER — Ambulatory Visit: Payer: Medicare Other | Attending: Cardiology | Admitting: Cardiology

## 2023-12-01 ENCOUNTER — Encounter: Payer: Self-pay | Admitting: Cardiology

## 2023-12-01 VITALS — BP 136/68 | HR 54 | Ht 65.0 in | Wt 177.2 lb

## 2023-12-01 DIAGNOSIS — I10 Essential (primary) hypertension: Secondary | ICD-10-CM

## 2023-12-01 DIAGNOSIS — I6523 Occlusion and stenosis of bilateral carotid arteries: Secondary | ICD-10-CM | POA: Diagnosis not present

## 2023-12-01 DIAGNOSIS — I2510 Atherosclerotic heart disease of native coronary artery without angina pectoris: Secondary | ICD-10-CM | POA: Diagnosis not present

## 2023-12-01 DIAGNOSIS — E785 Hyperlipidemia, unspecified: Secondary | ICD-10-CM

## 2023-12-01 DIAGNOSIS — I714 Abdominal aortic aneurysm, without rupture, unspecified: Secondary | ICD-10-CM

## 2023-12-01 MED ORDER — REPATHA SURECLICK 140 MG/ML ~~LOC~~ SOAJ: 140.0000 mg | SUBCUTANEOUS | 11 refills | Status: DC

## 2023-12-01 NOTE — Patient Instructions (Addendum)
 Medication Instructions:  Take Hydralazine  10 mg three times a day *If you need a refill on your cardiac medications before your next appointment, please call your pharmacy*  Lab Work: No labs  Testing/Procedures: No testing  Follow-Up: At [facility], you and your health needs are our priority.  As part of our continuing mission to provide you with exceptional heart care, we have created designated Provider Care Teams.  These Care Teams include your primary Cardiologist (physician) and Advanced Practice Providers (APPs -  Physician Assistants and Nurse Practitioners) who all work together to provide you with the care you need, when you need it.  We recommend signing up for the patient portal called MyChart.  Sign up information is provided on this After Visit Summary.  MyChart is used to connect with patients for Virtual Visits (Telemedicine).  Patients are able to view lab/test results, encounter notes, upcoming appointments, etc.  Non-urgent messages can be sent to your provider as well.   To learn more about what you can do with MyChart, go to [email or web address].    Your next appointment:   March 13th at 11:20 am with Darryle ONEIDA Decent, MD    Other Instructions: Please take your blood pressure daily for 2 weeks. If Blood pressure is consistently higher than 130/80 please call our office.  HOW TO TAKE YOUR BLOOD PRESSURE: Rest 5 minutes before taking your blood pressure. Don't smoke or drink caffeinated beverages for at least 30 minutes before. Take your blood pressure before (not after) you eat. Sit comfortably with your back supported and both feet on the floor (don't cross your legs). Elevate your arm to heart level on a table or a desk. Use the proper sized cuff. It should fit smoothly and snugly around your bare upper arm. There should be enough room to slip a fingertip under the cuff. The bottom edge of the cuff should be 1 inch above the crease of the  elbow. Ideally, take 3 measurements at one sitting and record the average.

## 2023-12-03 DIAGNOSIS — Z961 Presence of intraocular lens: Secondary | ICD-10-CM | POA: Diagnosis not present

## 2023-12-03 DIAGNOSIS — H43813 Vitreous degeneration, bilateral: Secondary | ICD-10-CM | POA: Diagnosis not present

## 2023-12-03 DIAGNOSIS — H40013 Open angle with borderline findings, low risk, bilateral: Secondary | ICD-10-CM | POA: Diagnosis not present

## 2023-12-03 DIAGNOSIS — H26491 Other secondary cataract, right eye: Secondary | ICD-10-CM | POA: Diagnosis not present

## 2023-12-08 DIAGNOSIS — R3914 Feeling of incomplete bladder emptying: Secondary | ICD-10-CM | POA: Diagnosis not present

## 2024-01-08 DIAGNOSIS — R3914 Feeling of incomplete bladder emptying: Secondary | ICD-10-CM | POA: Diagnosis not present

## 2024-01-26 NOTE — Progress Notes (Deleted)
  Cardiology Office Note:  .   Date:  01/26/2024  ID:  [name], DOB [birth year], MRN [id number] PCP: Tally Joe, MD   [facility] Prov[facility] Cardiologist:  Reatha Harps, MD {  History of Present Illness: .   No chief complaint on file.   Ricardo [name] is a 83 y.o. male with history of CAD, AAA, carotid artery disease, HLD, HTN who presents for follow-up.      Problem List 1. Moderate CAD -Prior RCA stent 2001 -LHC 8/24/201: 50% LAD; 40% RCA; 60% OM2 -NM 08/16/2021: normal  2. AAA -3.6 cm 2020 -3.8 cm 2022 -3.8 cm 2023 3. Carotid artery disease  -R ICA 1-39% -L ICA 1-39% 4. HLD -T chol 100, HDL 50, LDL 34, TG 78 5. HTN    ROS: All other ROS reviewed and negative. Pertinent positives noted in the HPI.     Studies Reviewed: Marland Kitchen       Vasc 03/20/2022 Summary:  Abdominal Aorta: There is evidence of abnormal dilatation of the distal  Abdominal aorta. The largest aortic measurement is 3.6 cm. The largest  aortic diameter remains essentially unchanged compared to prior exam.  Previous diameter measurement was 3.8 cm  Physical Exam:   VS:  There were no vitals taken for this visit.   Wt Readings from Last 3 Encounters:  12/01/23 177 lb 3.2 oz (80.4 kg)  09/28/23 167 lb 12.8 oz (76.1 kg)  05/05/23 174 lb (78.9 kg)    GEN: Well nourished, well developed in no acute distress NECK: No JVD; No carotid bruits CARDIAC: ***RRR, no murmurs, rubs, gallops RESPIRATORY:  Clear to auscultation without rales, wheezing or rhonchi  ABDOMEN: Soft, non-tender, non-distended EXTREMITIES:  No edema; No deformity  ASSESSMENT AND PLAN: .   ***    {Are you ordering a CV Procedure (e.g. stress test, cath, DCCV, TEE, etc)?   Press F2        :[id number]}   Follow-up: No follow-ups on file.  Time Spent with Patient: I have spent a total of *** minutes caring for this patient today face to face, ordering and reviewing labs/tests, reviewing prior records/medical history, examining  the patient, establishing an assessment and plan, communicating results/findings to the patient/family, and documenting in the medical record.   Signed, Lenna Gilford. Flora Lipps, MD, [facility]  [facility]  [address], [address] [place], [place] [address] [phone]  9:14 PM

## 2024-01-28 ENCOUNTER — Ambulatory Visit: Payer: Medicare Other | Admitting: Cardiovascular Disease

## 2024-01-28 DIAGNOSIS — I714 Abdominal aortic aneurysm, without rupture, unspecified: Secondary | ICD-10-CM

## 2024-01-28 DIAGNOSIS — I6523 Occlusion and stenosis of bilateral carotid arteries: Secondary | ICD-10-CM

## 2024-01-28 DIAGNOSIS — E785 Hyperlipidemia, unspecified: Secondary | ICD-10-CM

## 2024-01-28 DIAGNOSIS — I2510 Atherosclerotic heart disease of native coronary artery without angina pectoris: Secondary | ICD-10-CM

## 2024-01-28 DIAGNOSIS — I10 Essential (primary) hypertension: Secondary | ICD-10-CM

## 2024-01-28 DIAGNOSIS — I951 Orthostatic hypotension: Secondary | ICD-10-CM

## 2024-02-01 NOTE — Progress Notes (Unsigned)
 Cardiology Office Note:  .   Date:  02/04/2024  ID:  Ricardo [name], DOB 01-Nov-1941, MRN [id number] PCP: Tally Joe, MD  [facility] Providers Cardiologist:  Reatha Harps, MD { History of Present Illness: .    Chief Complaint  Patient presents with   Follow-up         Ricardo [name] is a 83 y.o. male with history of CAD, HTN, HLD, AAA who presents for follow-up.    History of Present Illness   Ricardo [name] is an 83 year old male with coronary artery disease and hypertension who presents for follow-up after an ER visit for low blood pressure.  He experienced an episode of hypotension with a blood pressure of 91/44 mmHg, leading to lightheadedness and dizziness. This occurred at a restaurant, and he had not eaten prior to the event. No chest pain or dyspnea was noted during the episode.  His current medications include chlorthalidone 12.5 mg daily, clonidine patch 0.2 mg weekly, hydralazine 10 mg three times daily, Imdur 90 mg daily, metoprolol succinate 25 mg daily, and telmisartan 80 mg daily. He also takes aspirin 81 mg daily, Crestor 5 mg daily, and Repatha, with a recent LDL of 33 mg/dL. He started Flomax (tamsulosin) three weeks ago, which may contribute to his dizziness and lightheadedness. He is uncertain about who manages his medications due to seeing multiple providers.  In the emergency room, he was found to have an acute kidney injury with a serum creatinine of 1.85 mg/dL, up from a baseline of 1.15 mg/dL. Troponin levels were 20 ng/L initially and 17 ng/L on repeat testing. An EKG showed sinus bradycardia without acute ischemic changes.  He has a history of coronary artery disease with previous stent placement and a left heart catheterization in 2021 showing nonobstructive disease. A stress test in 2022 was normal. No current symptoms of angina.  He has a known abdominal aortic aneurysm measuring 4.2 cm, last evaluated in 2024.          Problem List 1.  Moderate CAD -Prior RCA stent 2001 -LHC 07/10/2020: 50% LAD; 40% RCA; 60% OM2 -NM 08/16/2021: normal  2. AAA -3.6 cm 2020 -3.8 cm 2022 -3.8 cm 2023 -4.2 cm  2024 3. Carotid artery disease  -R ICA 1-39% -L ICA 1-39% 4. HLD -T chol 103, HDL, LDL 33, TG 158 5. HTN    ROS: All other ROS reviewed and negative. Pertinent positives noted in the HPI.     Studies Reviewed: Marland Kitchen   EKG Interpretation Date/Time:  Thursday February 04 2024 09:37:56 EDT Ventricular Rate:  49 PR Interval:  188 QRS Duration:  90 QT Interval:  470 QTC Calculation: 424 R Axis:   50  Text Interpretation: Sinus bradycardia When compared with ECG of 03-Feb-2024 16:31, No significant change was found Confirmed by Lennie Odor [phone]) on 02/04/2024 9:40:09 AM    TTE 05/12/2021  1. Left ventricular ejection fraction, by estimation, is 60 to 65%. The  left ventricle has normal function. The left ventricle has no regional  wall motion abnormalities. There is mild concentric left ventricular  hypertrophy. Left ventricular diastolic  parameters are consistent with Grade I diastolic dysfunction (impaired  relaxation).   2. Right ventricular systolic function is normal. The right ventricular  size is normal. Tricuspid regurgitation signal is inadequate for assessing  PA pressure.   3. The mitral valve is grossly normal. Trivial mitral valve  regurgitation. No evidence of mitral stenosis.   4. The  aortic valve is tricuspid. Aortic valve regurgitation is trivial.  No aortic stenosis is present.   LHC 07/10/2020 Ost LAD to Prox LAD lesion is 50% stenosed. Mid LAD to Dist LAD lesion is 50% stenosed. Ost RCA lesion is 40% stenosed. Prox RCA lesion is 40% stenosed. Mid RCA lesion is 40% stenosed. 2nd Diag lesion is 50% stenosed. 2nd Mrg lesion is 60% stenosed. Mid Cx lesion is 30% stenosed. Physical Exam:   VS:  BP 134/74 (BP Location: Left Arm, Patient Position: Sitting, Cuff Size: Normal)   Pulse (!) 49   Ht 5\' 6"   (1.676 m)   Wt 173 lb 9.6 oz (78.7 kg)   SpO2 97%   BMI 28.02 kg/m    Wt Readings from Last 3 Encounters:  02/04/24 173 lb 9.6 oz (78.7 kg)  02/03/24 177 lb 4 oz (80.4 kg)  12/01/23 177 lb 3.2 oz (80.4 kg)    GEN: Well nourished, well developed in no acute distress NECK: No JVD; No carotid bruits CARDIAC: RRR, no murmurs, rubs, gallops RESPIRATORY:  Clear to auscultation without rales, wheezing or rhonchi  ABDOMEN: Soft, non-tender, non-distended EXTREMITIES:  No edema; No deformity  ASSESSMENT AND PLAN: .   Assessment and Plan    Hypotension Likely iatrogenic due to excessive antihypertensive medication, causing dizziness and lightheadedness, contributing to AKI. - Discontinue hydralazine and metoprolol. - Initiate blood pressure monitoring twice daily. - Ensure hydration with 4-6 glasses of water daily. - Monitor dietary salt intake.  Acute Kidney Injury (AKI) Serum creatinine elevated to 1.85 from 1.15, likely secondary to hypotension and dehydration. - Recheck BMP on Monday. - Ensure adequate hydration.  Hypertension Chronic hypertension with complex medication regimen. Simplifying regimen due to hypotension risk while maintaining control. - Continue chlorthalidone 12.5 mg daily. - Continue clonidine patch 0.2 mg Q24 hours. - Continue Imdur 90 mg daily. - Continue telmisartan 80 mg daily. - Monitor blood pressure twice daily and maintain a log.  Coronary Artery Disease (CAD) Previous stent placement, no current angina. Left heart catheterization in 2021 showed nonobstructive disease. Normal stress test in 2022. - Continue aspirin 81 mg daily. - Continue Crestor 5 mg daily. - Continue Repatha. - Monitor for angina symptoms.  Abdominal Aortic Aneurysm (AAA) AAA last measured at 4.2 cm in 2024. Due for repeat imaging. - Order aortic ultrasound.              Follow-up: Return in about 6 weeks (around 03/17/2024).   Signed, Lenna Gilford. Flora Lipps, MD, [facility]  [facility]  [address] [place], [place] [address] [phone]  10:22 AM

## 2024-02-03 ENCOUNTER — Other Ambulatory Visit: Payer: Self-pay

## 2024-02-03 ENCOUNTER — Emergency Department (HOSPITAL_COMMUNITY)
Admission: EM | Admit: 2024-02-03 | Discharge: 2024-02-03 | Disposition: A | Discharge status: Home / Self Care | Attending: Emergency Medicine | Admitting: Emergency Medicine

## 2024-02-03 ENCOUNTER — Encounter (HOSPITAL_COMMUNITY): Payer: Self-pay | Admitting: *Deleted

## 2024-02-03 DIAGNOSIS — R42 Dizziness and giddiness: Secondary | ICD-10-CM | POA: Diagnosis not present

## 2024-02-03 DIAGNOSIS — Z79899 Other long term (current) drug therapy: Secondary | ICD-10-CM | POA: Insufficient documentation

## 2024-02-03 DIAGNOSIS — R464 Slowness and poor responsiveness: Secondary | ICD-10-CM | POA: Diagnosis not present

## 2024-02-03 DIAGNOSIS — I2510 Atherosclerotic heart disease of native coronary artery without angina pectoris: Secondary | ICD-10-CM | POA: Diagnosis not present

## 2024-02-03 DIAGNOSIS — I10 Essential (primary) hypertension: Secondary | ICD-10-CM | POA: Diagnosis not present

## 2024-02-03 DIAGNOSIS — R4189 Other symptoms and signs involving cognitive functions and awareness: Secondary | ICD-10-CM

## 2024-02-03 DIAGNOSIS — Z7982 Long term (current) use of aspirin: Secondary | ICD-10-CM | POA: Insufficient documentation

## 2024-02-03 LAB — URINALYSIS, ROUTINE W REFLEX MICROSCOPIC
Bilirubin Urine: NEGATIVE
Glucose, UA: NEGATIVE mg/dL
Hgb urine dipstick: NEGATIVE
Ketones, ur: NEGATIVE mg/dL
Leukocytes,Ua: NEGATIVE
Nitrite: NEGATIVE
Protein, ur: NEGATIVE mg/dL
Specific Gravity, Urine: 1.011 (ref 1.005–1.030)
pH: 6 (ref 5.0–8.0)

## 2024-02-03 LAB — CBG MONITORING, ED: Glucose-Capillary: 143 mg/dL — ABNORMAL HIGH (ref 70–99)

## 2024-02-03 LAB — CBC WITH DIFFERENTIAL/PLATELET
Abs Immature Granulocytes: 0.03 10*3/uL (ref 0.00–0.07)
Basophils Absolute: 0 10*3/uL (ref 0.0–0.1)
Basophils Relative: 1 %
Eosinophils Absolute: 0 10*3/uL (ref 0.0–0.5)
Eosinophils Relative: 0 %
HCT: 40.1 % (ref 39.0–52.0)
Hemoglobin: 13.1 g/dL (ref 13.0–17.0)
Immature Granulocytes: 0 %
Lymphocytes Relative: 13 %
Lymphs Abs: 1 10*3/uL (ref 0.7–4.0)
MCH: 28.3 pg (ref 26.0–34.0)
MCHC: 32.7 g/dL (ref 30.0–36.0)
MCV: 86.6 fL (ref 80.0–100.0)
Monocytes Absolute: 0.5 10*3/uL (ref 0.1–1.0)
Monocytes Relative: 6 %
Neutro Abs: 5.9 10*3/uL (ref 1.7–7.7)
Neutrophils Relative %: 80 %
Platelets: 125 10*3/uL — ABNORMAL LOW (ref 150–400)
RBC: 4.63 MIL/uL (ref 4.22–5.81)
RDW: 12.1 % (ref 11.5–15.5)
WBC: 7.5 10*3/uL (ref 4.0–10.5)
nRBC: 0.3 % — ABNORMAL HIGH (ref 0.0–0.2)

## 2024-02-03 LAB — TROPONIN I (HIGH SENSITIVITY)
Troponin I (High Sensitivity): 20 ng/L — ABNORMAL HIGH (ref <18)
Troponin I (High Sensitivity): 17 ng/L (ref <18)

## 2024-02-03 LAB — BASIC METABOLIC PANEL
Anion gap: 10 (ref 5–15)
BUN: 27 mg/dL — ABNORMAL HIGH (ref 8–23)
CO2: 24 mmol/L (ref 22–32)
Calcium: 9.3 mg/dL (ref 8.9–10.3)
Chloride: 108 mmol/L (ref 98–111)
Creatinine, Ser: 1.85 mg/dL — ABNORMAL HIGH (ref 0.61–1.24)
GFR, Estimated: 36 mL/min — ABNORMAL LOW (ref >60)
Glucose, Bld: 141 mg/dL — ABNORMAL HIGH (ref 70–99)
Potassium: 4.4 mmol/L (ref 3.5–5.1)
Sodium: 142 mmol/L (ref 135–145)

## 2024-02-03 MED ORDER — SODIUM CHLORIDE 0.9 % IV BOLUS
1000.0000 mL | Freq: Once | INTRAVENOUS | Status: AC
  Administered 2024-02-03: 1000 mL via INTRAVENOUS

## 2024-02-03 MED ORDER — SODIUM CHLORIDE 0.9 % IV BOLUS: 1000.0000 mL | Freq: Once | INTRAVENOUS | Status: DC

## 2024-02-03 NOTE — ED Triage Notes (Signed)
 The pt became less responsive after he worked in the sun for 3 hours and he had not eaten  while they were waiting at a restaurant the pt became less responsive for a few seconds  he had dizziness for approx 2 weeks

## 2024-02-03 NOTE — Discharge Instructions (Signed)
 Your episode of dizziness and unresponsiveness may have been related to a drop in your blood pressure.  You are on multiple blood pressure medications to lower your blood pressure.  Sometimes this can cause sudden swings in blood pressure.  I did recommend that you try to drink more water to stay better hydrated.  I recommend that tonight you hold and not take your hydralazine and metoprolol evening medication for blood pressure.  I recommend that you follow-up with your cardiologist in the office to talk about your blood pressure medications.

## 2024-02-03 NOTE — ED Triage Notes (Signed)
 The pt after his unrespronsiveness episode was taken to a store and had orange juice and peanut butter

## 2024-02-03 NOTE — ED Provider Notes (Signed)
 [facility] AT [facility] Provider Note   CSN: [id number] Arrival date & time: 02/03/24  1604     History  Chief Complaint  Patient presents with   Dizziness    Ricardo [name] is a 83 y.o. male with a history of hypertension, coronary disease, presented to the ED with lightheadedness.  Patient reports he has noted that for the several past weeks, a few hours after taking his morning blood pressure medicines he will begin to feel very lightheaded and woozy.  He does not regularly check his blood pressure.  He says he has not a problem with longstanding hypertension and sees a cardiologist, and is currently on 5 or 6 different blood pressure medications.  His family helps him take these medicines as prescribed.  He said this morning he took his typical medications.  He worked outside for period of time but began to feel woozy.  He checked his blood pressure and it was 100 systolic.  An hour later he checked his blood pressure again and it was 90 systolic.  He was on his way to the restaurant with his wife and in the car began to feel extremely lightheaded, had some difficulty with speech, and confusion.  His wife brought him to the emergency department.  Since arriving here he feels back to normal, although with a trace of lightheadedness.  He generally does not drink water but drinks a hydrating low calorie tea, 3 bottles a day.  He does have a cardiology appointment tomorrow.  I reviewed his external records including his last cardiology evaluation from December 01, 2023, 2 months ago, where there is extensive notation about the patient's labile blood pressures.  They note at that time that the patient is on hydralazine 10 mg 3 times daily, metoprolol XL 25 mg nightly, telmisartan 80 mg daily, clonidine 0.2 mg patch weekly, isosorbide 60 mg daily, and chlorthalidone 12.5 mg daily.    He had vascular carotid ultrasounds performed 4 months ago in November 2024 which  showed 1 to 40% "mild plaque buildup"; no high-grade stenosis.  He also has a known stable AAA 3.8 cm  HPI     Home Medications Prior to Admission medications   Medication Sig Start Date End Date Taking? Authorizing Provider  acetaminophen (TYLENOL) 500 MG tablet Take 1,000 mg by mouth daily as needed for headache (pain).    [provider]  aspirin 81 MG tablet Take 1 tablet (81 mg total) by mouth daily. 11/16/11   Natalia Leatherwood, MD  chlorthalidone (HYGROTON) 25 MG tablet Take 0.5 tablets (12.5 mg total) by mouth daily. 10/20/23   O'NealRonnald Ramp, MD  cloNIDine (CATAPRES - DOSED IN MG/24 HR) 0.2 mg/24hr patch Place 0.2 mg onto the skin once a week.    [provider]  Evolocumab (REPATHA SURECLICK) 140 MG/ML SOAJ Inject 140 mg into the skin See admin instructions. Inject 140 mg subcutaneously on the 1st and 15th of each month 12/01/23   Reather Littler D, NP  hydrALAZINE (APRESOLINE) 10 MG tablet Take 1 tablet (10 mg total) by mouth 3 (three) times daily. 09/28/23   Reather Littler D, NP  isosorbide mononitrate (IMDUR) 60 MG 24 hr tablet TAKE 1 AND 1/2 TABLET BY MOUTH DAILY Patient taking differently: Take 60 mg by mouth daily. 08/21/22   O'NealRonnald Ramp, MD  metoprolol succinate (TOPROL XL) 25 MG 24 hr tablet TAKE ONE TABLET NIGHTLY 09/28/23   Reather Littler D, NP  [facility] 25  MG TB24 tablet Take 25 mg by mouth daily. 10/14/23   [provider]  nitroGLYCERIN (NITROSTAT) 0.4 MG SL tablet Place 1 tablet (0.4 mg total) under the tongue every 5 (five) minutes as needed. 09/28/23 12/27/23  Reather Littler D, NP  rosuvastatin (CRESTOR) 5 MG tablet Take 1 tablet (5 mg total) by mouth daily. 09/30/21   Sande Rives, MD  telmisartan (MICARDIS) 80 MG tablet Take 1 tablet (80 mg total) by mouth daily. 09/30/21   O'Neal, Ronnald Ramp, MD      Allergies    Clonidine hcl, Cyclobenzaprine, and Norvasc [amlodipine besylate]    Review of Systems   Review of  Systems  Physical Exam Updated Vital Signs BP (!) 173/78 (BP Location: Right Arm)   Pulse (!) 46   Temp (!) 97.4 F (36.3 C) (Oral)   Resp (!) 31   Ht 5\' 5"  (1.651 m)   Wt 80.4 kg   SpO2 98%   BMI 29.50 kg/m  Physical Exam Constitutional:      General: He is not in acute distress. HENT:     Head: Normocephalic and atraumatic.  Eyes:     Conjunctiva/sclera: Conjunctivae normal.     Pupils: Pupils are equal, round, and reactive to light.  Cardiovascular:     Rate and Rhythm: Normal rate and regular rhythm.     Heart sounds:     No gallop.  Pulmonary:     Effort: Pulmonary effort is normal. No respiratory distress.  Abdominal:     General: There is no distension.     Tenderness: There is no abdominal tenderness.  Skin:    General: Skin is warm and dry.  Neurological:     General: No focal deficit present.     Mental Status: He is alert. Mental status is at baseline.  Psychiatric:        Mood and Affect: Mood normal.        Behavior: Behavior normal.     ED Results / Procedures / Treatments   Labs (all labs ordered are listed, but only abnormal results are displayed) Labs Reviewed  BASIC METABOLIC PANEL - Abnormal; Notable for the following components:      Result Value   Glucose, Bld 141 (*)    BUN 27 (*)    Creatinine, Ser 1.85 (*)    GFR, Estimated 36 (*)    All other components within normal limits  CBC WITH DIFFERENTIAL/PLATELET - Abnormal; Notable for the following components:   Platelets 125 (*)    nRBC 0.3 (*)    All other components within normal limits  CBG MONITORING, ED - Abnormal; Notable for the following components:   Glucose-Capillary 143 (*)    All other components within normal limits  TROPONIN I (HIGH SENSITIVITY) - Abnormal; Notable for the following components:   Troponin I (High Sensitivity) 20 (*)    All other components within normal limits  URINALYSIS, ROUTINE W REFLEX MICROSCOPIC  TROPONIN I (HIGH SENSITIVITY)    EKG EKG  Interpretation Date/Time:  Wednesday February 03 2024 16:31:19 EDT Ventricular Rate:  60 PR Interval:  176 QRS Duration:  86 QT Interval:  452 QTC Calculation: 452 R Axis:   9  Text Interpretation: Normal sinus rhythm Normal ECG When compared with ECG of 28-Sep-2023 08:48, PREVIOUS ECG IS PRESENT Confirmed by Alvester Chou [phone]) on 02/03/2024 8:11:41 PM  Radiology No results found.  Procedures Procedures    Medications Ordered in ED Medications  sodium chloride 0.9 %  bolus 1,000 mL (1,000 mLs Intravenous New Bag/Given 02/03/24 2243)    ED Course/ Medical Decision Making/ A&P                                 Medical Decision Making  This patient presents to the ED with concern for transient lightheadedness, dizziness, confusion. This involves an extensive number of treatment options, and is a complaint that carries with it a high risk of complications and morbidity.  The differential diagnosis includes symptomatic hypotension versus metabolic derangement versus other  Co-morbidities that complicate the patient evaluation: Multiple hypertensive medications at high risk of polypharmacy  Additional history obtained from patient's daughter and wife at bedside  External records from outside source obtained and reviewed including cardiology office evaluation  I ordered and personally interpreted labs.  The pertinent results include: No emergent findings.  There is an elevation of his creatinine now to 1.85, from baseline level 1.04 months ago.  Per my interpretation the patient's ECG shows no acute ischemic findings  I ordered medication including IV fluid bolus for potential dehydration, orthostasis, and and prerenal injury  I have reviewed the patients home medicines and have made adjustments as needed  Test Considered: Low suspicion for stroke, CVA, pulmonary embolism, intrathoracic emergency.  No indication for neuroimaging or thoracic imaging at this time.   After the  interventions noted above, I reevaluated the patient and found that they have: improved  Dispostion:  After consideration of the diagnostic results and the patients response to treatment, I feel that the patent would benefit from close outpatient follow-up with a cardiologist in the clinic tomorrow.  I will defer to the cardiologist to make further changes to the patient's longstanding and complicated blood pressure regimen.  For tonight I have advised the patient to hold his evening metoprolol and hydralazine medication, and the patient and his family verbalized understanding.          Final Clinical Impression(s) / ED Diagnoses Final diagnoses:  Unresponsiveness    Rx / DC Orders ED Discharge Orders     None         Noel Rodier, Kermit Balo, MD 02/03/24 2319

## 2024-02-03 NOTE — ED Provider Triage Note (Signed)
 Emergency Medicine Provider Triage Evaluation Note  Ricardo [name] , a 83 y.o. male  was evaluated in triage.  Pt complains of dizziness approximately 2 weeks, had episode of decreased responsiveness after working in the sun and having not eaten.  Also had another episode of decreased responsiveness while at a restaurant.  Takes blood thinner.  Also had low blood pressure reading at home.  Review of Systems  Positive: Intermittent dizziness Negative: Vision changes, nausea, vomiting, headache, tinnitus, trauma  Physical Exam  BP 111/68   Pulse 63   Temp (!) 97.5 F (36.4 C) (Oral)   Resp 17   Ht 5\' 5"  (1.651 m)   Wt 80.4 kg   SpO2 97%   BMI 29.50 kg/m  Gen:   Awake, no distress   Resp:  Normal effort  MSK:   Moves extremities without difficulty  Other:    Medical Decision Making  Medically screening exam initiated at 4:17 PM.  Appropriate orders placed.  Ricardo [name] was informed that the remainder of the evaluation will be completed by another provider, this initial triage assessment does not replace that evaluation, and the importance of remaining in the ED until their evaluation is complete.  Labs and imaging ordered   Dolphus Jenny, Cordelia Poche 02/03/24 1610

## 2024-02-04 ENCOUNTER — Ambulatory Visit: Attending: Cardiovascular Disease | Admitting: Cardiovascular Disease

## 2024-02-04 ENCOUNTER — Encounter: Payer: Self-pay | Admitting: Cardiovascular Disease

## 2024-02-04 VITALS — BP 134/74 | HR 49 | Ht 66.0 in | Wt 173.6 lb

## 2024-02-04 DIAGNOSIS — I6523 Occlusion and stenosis of bilateral carotid arteries: Secondary | ICD-10-CM

## 2024-02-04 DIAGNOSIS — I951 Orthostatic hypotension: Secondary | ICD-10-CM | POA: Diagnosis not present

## 2024-02-04 DIAGNOSIS — I10 Essential (primary) hypertension: Secondary | ICD-10-CM | POA: Diagnosis not present

## 2024-02-04 DIAGNOSIS — I2510 Atherosclerotic heart disease of native coronary artery without angina pectoris: Secondary | ICD-10-CM

## 2024-02-04 DIAGNOSIS — N179 Acute kidney failure, unspecified: Secondary | ICD-10-CM | POA: Diagnosis not present

## 2024-02-04 DIAGNOSIS — I714 Abdominal aortic aneurysm, without rupture, unspecified: Secondary | ICD-10-CM

## 2024-02-04 DIAGNOSIS — E785 Hyperlipidemia, unspecified: Secondary | ICD-10-CM | POA: Diagnosis not present

## 2024-02-04 NOTE — Patient Instructions (Addendum)
 Medication Instructions:  - STOP hydrALAZINE (APRESOLINE) 10 MG tablet  - STOP metoprolol succinate (TOPROL XL) 25 MG 24 hr tablet    *If you need a refill on your cardiac medications before your next appointment, please call your pharmacy*   Lab Work: Your physician recommends that you return for lab work in [facility] 02/08/24 FOR: - BASIC METABOLIC PANEL     If you have labs (blood work) drawn today and your tests are completely normal, you will receive your results only by: MyChart Message (if you have MyChart) OR A paper copy in the mail If you have any lab test that is abnormal or we need to change your treatment, we will call you to review the results.   Testing/Procedures: Echo will be scheduled at [address].  Your physician has requested that you have an echocardiogram. Echocardiography is a painless test that uses sound waves to create images of your heart. It provides your doctor with information about the size and shape of your heart and how well your heart's chambers and valves are working. This procedure takes approximately one hour. There are no restrictions for this procedure. Please do NOT wear cologne, perfume, aftershave, or lotions (deodorant is allowed). Please arrive 15 minutes prior to your appointment time.    Follow-Up: At [facility], you and your health needs are our priority.  As part of our continuing mission to provide you with exceptional heart care, we have created designated Provider Care Teams.  These Care Teams include your primary Cardiologist (physician) and Advanced Practice Providers (APPs -  Physician Assistants and Nurse Practitioners) who all work together to provide you with the care you need, when you need it.  We recommend signing up for the patient portal called "MyChart".  Sign up information is provided on this After Visit Summary.  MyChart is used to connect with patients for Virtual Visits (Telemedicine).  Patients are  able to view lab/test results, encounter notes, upcoming appointments, etc.  Non-urgent messages can be sent to your provider as well.   To learn more about what you can do with MyChart, go to [email or web address].    Your next appointment:   6 week(s): ok to ob  The format for your next appointment:   In Person  Provider:   Reatha Harps, MD    Other Instructions Dr. Flora Lipps would like for you  to check your blood pressure twice a day and keep a log of the readings.

## 2024-02-08 ENCOUNTER — Other Ambulatory Visit: Payer: Self-pay

## 2024-02-08 DIAGNOSIS — I10 Essential (primary) hypertension: Secondary | ICD-10-CM | POA: Diagnosis not present

## 2024-02-09 LAB — BASIC METABOLIC PANEL
BUN/Creatinine Ratio: 21 (ref 10–24)
BUN: 25 mg/dL (ref 8–27)
CO2: 23 mmol/L (ref 20–29)
Calcium: 9.5 mg/dL (ref 8.6–10.2)
Chloride: 106 mmol/L (ref 96–106)
Creatinine, Ser: 1.18 mg/dL (ref 0.76–1.27)
Glucose: 106 mg/dL — ABNORMAL HIGH (ref 70–99)
Potassium: 4.4 mmol/L (ref 3.5–5.2)
Sodium: 143 mmol/L (ref 134–144)
eGFR: 61 mL/min/{1.73_m2} (ref >59)

## 2024-02-11 ENCOUNTER — Encounter: Payer: Self-pay | Admitting: Cardiovascular Disease

## 2024-02-15 DIAGNOSIS — R001 Bradycardia, unspecified: Secondary | ICD-10-CM | POA: Diagnosis not present

## 2024-02-15 DIAGNOSIS — R55 Syncope and collapse: Secondary | ICD-10-CM | POA: Diagnosis not present

## 2024-02-15 DIAGNOSIS — R410 Disorientation, unspecified: Secondary | ICD-10-CM | POA: Diagnosis not present

## 2024-02-16 DIAGNOSIS — R001 Bradycardia, unspecified: Secondary | ICD-10-CM | POA: Diagnosis not present

## 2024-02-23 DIAGNOSIS — R55 Syncope and collapse: Secondary | ICD-10-CM | POA: Diagnosis not present

## 2024-03-04 ENCOUNTER — Ambulatory Visit (HOSPITAL_COMMUNITY)

## 2024-03-08 ENCOUNTER — Ambulatory Visit (INDEPENDENT_AMBULATORY_CARE_PROVIDER_SITE_OTHER): Admitting: Family Medicine

## 2024-03-08 ENCOUNTER — Encounter: Payer: Self-pay | Admitting: Family Medicine

## 2024-03-08 VITALS — BP 150/84 | HR 88 | Temp 97.8°F | Ht 66.0 in | Wt 174.4 lb

## 2024-03-08 DIAGNOSIS — R3914 Feeling of incomplete bladder emptying: Secondary | ICD-10-CM | POA: Diagnosis not present

## 2024-03-08 DIAGNOSIS — I1A0 Resistant hypertension: Secondary | ICD-10-CM | POA: Diagnosis not present

## 2024-03-08 DIAGNOSIS — N401 Enlarged prostate with lower urinary tract symptoms: Secondary | ICD-10-CM

## 2024-03-08 DIAGNOSIS — I2510 Atherosclerotic heart disease of native coronary artery without angina pectoris: Secondary | ICD-10-CM | POA: Diagnosis not present

## 2024-03-08 DIAGNOSIS — R55 Syncope and collapse: Secondary | ICD-10-CM | POA: Diagnosis not present

## 2024-03-08 DIAGNOSIS — Z8551 Personal history of malignant neoplasm of bladder: Secondary | ICD-10-CM

## 2024-03-08 DIAGNOSIS — E782 Mixed hyperlipidemia: Secondary | ICD-10-CM

## 2024-03-08 DIAGNOSIS — I7143 Infrarenal abdominal aortic aneurysm, without rupture: Secondary | ICD-10-CM | POA: Diagnosis not present

## 2024-03-08 NOTE — Progress Notes (Signed)
 Assessment/Plan:   Assessment & Plan Hypertension Hypertension with episodes of unresponsiveness, likely due to medication-induced orthostatic hypotension. Blood pressure readings vary from low (84/40 mmHg) to high (158/96 mmHg). Current medications include telmisartan , chlorthalidone , clonidine  patch, and isosorbide  mononitrate. Clonidine  and tamsulosin may contribute to instability. Adjusting medication timing has improved symptoms. - Refer to specialty resistant blood pressure clinic for management. - Continue current medication regimen with separated dosing. - Monitor blood pressure regularly, avoiding excessive checks. - Ensure adequate hydration and nutrition. - Discuss with urology the possibility of changing tamsulosin.  Orthostatic Hypotension Episodes likely due to medication interactions and dehydration, causing unresponsiveness and low blood pressure. Adjusting medication timing has improved symptoms. - Refer to specialty resistant blood pressure clinic. - Ensure adequate hydration and nutrition. - Discuss with urology the possibility of changing tamsulosin.  Benign Prostatic Hyperplasia with Lower Urinary Tract Symptoms Managed with tamsulosin and Robetric. Tamsulosin may affect blood pressure. Symptoms include urgency and frequency. Urology consultation recommended for alternative treatments. - Discuss with urology the possibility of changing tamsulosin. - Continue Myrbetriq.  Coronary Artery Disease Managed with isosorbide  mononitrate for vasodilation. Echocardiogram results are normal. - Continue isosorbide  mononitrate. - Follow up with cardiology as scheduled.  Hyperlipidemia Managed with Repatha  due to statin intolerance. No acute issues.  Bladder Cancer Regular urological follow-up. No acute issues. - Continue regular follow-up with urology.  Follow-up Plan to monitor progress and adjust treatment as necessary. - Follow up in 3 months to reassess blood  pressure management and overall health. - Return sooner if symptoms worsen or new symptoms develop.      There are no discontinued medications.  Return in about 3 months (around 06/07/2024) for BP.    Subjective:   Encounter date: 03/08/2024  LEONTAE BOSTOCK is a 83 y.o. male who has CAD (coronary artery disease), native coronary artery; Hyperlipidemia; Essential hypertension; Abdominal aneurysm (HCC); Personal history of malignant neoplasm of bladder; Bradycardia; Chest pain at rest; Abdominal aortic aneurysm (AAA) without rupture (HCC); Unstable angina (HCC); Sinus bradycardia; Hypertensive emergency; Pain in finger of right hand; Nasal valve collapse; Muscle tension dysphonia; Deviated nasal septum; and Acquired trigger finger of right ring finger on their problem list..   He  has a past medical history of Abdominal aneurysm (HCC) (08/30/2018), Arthritis, Bladder cancer (HCC) (DX   OCT 2011--  FOLLOWED BY DR Gordy Lauber), CAD (coronary artery disease), native coronary artery (08/30/2018), Coronary artery disease (CARDIOLOGIST- DR Anastasia Balo-  LAST VISIT 2 WKS AGO-- WILL REQUEST NOTE AND EKG), Essential hypertension (08/30/2018), Hyperlipidemia, Hypertension, Nocturia, Personal history of malignant neoplasm of bladder (08/30/2018), and Post PTCA (2001-   X1 STENT TO RCA).Dan Scearce Aas   He presents with chief complaint of Establish Care (Pt was seen in the ED for syncope. Last fainting spell was 4/8, 4/11. ) .   Discussed the use of AI scribe software for clinical note transcription with the patient, who gave verbal consent to proceed.  History of Present Illness JAKEVIOUS HOLLISTER is an 83 year old male with hypertension and coronary artery disease who presents with episodes of unresponsiveness and low blood pressure.  He has been experiencing episodes of unresponsiveness since March, with the first episode occurring while on the way to a restaurant. During these episodes, he is unable to move or respond,  although he can see and hear. These episodes have occurred multiple times, including once in the backyard and another time after an echocardiogram. The episodes typically resolve within five minutes. He has a history of  low blood pressure during these episodes, with one instance recorded at 84/40 mmHg.  His current medications include chlorthalidone  12.5 mg daily, clonidine  patch 0.2 mg weekly, isosorbide  mononitrate, and telmisartan  80 mg daily. He also takes tamsulosin and another prostate medication in the afternoon, and aspirin  at 2 PM. Adjusting the timing of his diuretic to the morning has improved his sleep duration from two to six or seven hours per night.  He has a history of coronary artery disease and has undergone echocardiograms, which have returned normal results. He also has a history of bladder cancer and is monitored annually by a urologist. He has hyperlipidemia and is on Repatha  due to statin intolerance. He experiences issues with dehydration, particularly during hot weather.  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 No current chest pain, shortness of breath, or dizziness.       Past Surgical History:  Procedure Laterality Date   CORONARY ANGIOPLASTY  1996   CORONARY ANGIOPLASTY WITH STENT PLACEMENT  2001   STENT TO RCA   CYSTO/ BLADDER BX  10-22-10   CYSTOSCOPY W/ RETROGRADES  11/12/2011   Procedure: CYSTOSCOPY WITH RETROGRADE PYELOGRAM;  Surgeon: Soledad Dupes, MD;  Location: [facility];  Service: Urology;  Laterality: Bilateral;   LEFT HEART CATH AND CORONARY ANGIOGRAPHY N/A 07/09/2020   Procedure: LEFT HEART CATH AND CORONARY ANGIOGRAPHY;  Surgeon: Pasqual Bone, MD;  Location: [facility];  Service: Cardiovascular;  Laterality: N/A;   LUMBAR FUSION     TRANSURETHRAL RESECTION OF BLADDER TUMOR  08-27-10   RIGHT ANTERIOR DOME   TRANSURETHRAL RESECTION OF BLADDER  TUMOR  11/12/2011   Procedure: TRANSURETHRAL RESECTION OF BLADDER TUMOR (TURBT);  Surgeon: Soledad Dupes, MD;  Location: [facility];  Service: Urology;  Laterality: N/A;  with PK Gyrus c-arm gyrus  digital ureteroscope    Outpatient Medications Prior to Visit  Medication Sig Dispense Refill   acetaminophen  (TYLENOL ) 500 MG tablet Take 1,000 mg by mouth daily as needed for headache (pain).     aspirin  81 MG tablet Take 1 tablet (81 mg total) by mouth daily. 1 tablet 0   chlorthalidone  (HYGROTON ) 25 MG tablet Take 0.5 tablets (12.5 mg total) by mouth daily. 45 tablet 1   cloNIDine  (CATAPRES  - DOSED IN MG/24 HR) 0.2 mg/24hr patch Place 0.2 mg onto the skin once a week.     Evolocumab  (REPATHA  SURECLICK) 140 MG/ML SOAJ Inject 140 mg into the skin See admin instructions. Inject 140 mg subcutaneously on the 1st and 15th of each month 2 mL 11   isosorbide  mononitrate (IMDUR ) 60 MG 24 hr tablet TAKE 1 AND 1/2 TABLET BY MOUTH DAILY (Patient taking differently: Take 60 mg by mouth daily.) 135 tablet 0   MYRBETRIQ 25 MG TB24 tablet Take 25 mg by mouth daily.     Red Yeast Rice Extract (RED YEAST RICE PO) Take 1,200 mg by mouth daily.     tamsulosin (FLOMAX) 0.4 MG CAPS capsule Take 0.4 mg by mouth daily after supper.     telmisartan  (MICARDIS ) 80 MG tablet Take 1 tablet (80 mg total) by mouth daily. 90 tablet 1   nitroGLYCERIN  (NITROSTAT ) 0.4 MG SL tablet Place 1 tablet (0.4 mg total) under the tongue every 5 (five) minutes as needed. 25 tablet 3   rosuvastatin  (CRESTOR ) 5 MG tablet Take 1 tablet (5 mg total) by mouth daily. (Patient not taking: Reported on 03/08/2024) 90  tablet 1   TURMERIC PO Take by mouth daily. Black pepper 5mg  Turmeric root 420 mg Turmeric root extract 230 mg (Patient not taking: Reported on 03/08/2024)     No facility-administered medications prior to visit.    Family History  Problem Relation Age of Onset   Diabetes Mother    Hypertension Mother      Social History   Socioeconomic History   Marital status: Married    Spouse name: Not on file   Number of children: 4   Years of education: Not on file   Highest education level: Not on file  Occupational History   Occupation: retired - Advice worker  Tobacco Use   Smoking status: Former    Current packs/day: 0.00    Average packs/day: 1.5 packs/day for 5.0 years (7.5 ttl pk-yrs)    Types: Cigarettes    Start date: 11/05/1960    Quit date: 11/05/1965    Years since quitting: 58.3   Smokeless tobacco: Never  Vaping Use   Vaping status: Never Used  Substance and Sexual Activity   Alcohol use: No   Drug use: No   Sexual activity: Not on file  Other Topics Concern   Not on file  Social History Narrative   Not on file   Social Drivers of Health   Financial Resource Strain: Not on file  Food Insecurity: Not on file  Transportation Needs: Not on file  Physical Activity: Not on file  Stress: Not on file  Social Connections: Not on file  Intimate Partner Violence: Not on file                                                                                                  Objective:  Physical Exam: BP (!) 158/96   Pulse 88   Temp 97.8 F (36.6 C) (Temporal)   Ht 5\' 6"  (1.676 m)   Wt 174 lb 6.4 oz (79.1 kg)   SpO2 98%   BMI 28.15 kg/m    No data found.   Physical Exam Constitutional:      Appearance: Normal appearance.  HENT:     Head: Normocephalic and atraumatic.     Right Ear: Hearing normal.     Left Ear: Hearing normal.     Nose: Nose normal.  Eyes:     General: No scleral icterus.       Right eye: No discharge.        Left eye: No discharge.     Extraocular Movements: Extraocular movements intact.  Cardiovascular:     Rate and Rhythm: Normal rate and regular rhythm.     Heart sounds: Normal heart sounds.  Pulmonary:     Effort: Pulmonary effort is normal.     Breath sounds: Normal breath sounds.  Abdominal:     Palpations: Abdomen is soft.      Tenderness: There is no abdominal tenderness.  Skin:    General: Skin is warm.     Findings: No rash.  Neurological:     General: No focal deficit present.     Mental Status: He  is alert.     Cranial Nerves: No cranial nerve deficit.  Psychiatric:        Mood and Affect: Mood normal.        Behavior: Behavior normal.        Thought Content: Thought content normal.        Judgment: Judgment normal.     No results found.  Recent Results (from the past 2160 hours)  Basic metabolic panel     Status: Abnormal   Collection Time: 02/03/24  4:21 PM  Result Value Ref Range   Sodium 142 135 - 145 mmol/L   Potassium 4.4 3.5 - 5.1 mmol/L   Chloride 108 98 - 111 mmol/L   CO2 24 22 - 32 mmol/L   Glucose, Bld 141 (H) 70 - 99 mg/dL    Comment: Glucose reference range applies only to samples taken after fasting for at least 8 hours.   BUN 27 (H) 8 - 23 mg/dL   Creatinine, Ser 1.61 (H) 0.61 - 1.24 mg/dL   Calcium  9.3 8.9 - 10.3 mg/dL   GFR, Estimated 36 (L) >60 mL/min    Comment: (NOTE) Calculated using the CKD-EPI Creatinine Equation (2021)    Anion gap 10 5 - 15    Comment: Performed at [facility], [address] [place], [place] [address]  CBC with Differential     Status: Abnormal   Collection Time: 02/03/24  4:21 PM  Result Value Ref Range   WBC 7.5 4.0 - 10.5 K/uL   RBC 4.63 4.22 - 5.81 MIL/uL   Hemoglobin 13.1 13.0 - 17.0 g/dL   HCT 54.0 98.1 - 19.1 %   MCV 86.6 80.0 - 100.0 fL   MCH 28.3 26.0 - 34.0 pg   MCHC 32.7 30.0 - 36.0 g/dL   RDW 47.8 29.5 - 62.1 %   Platelets 125 (L) 150 - 400 K/uL    Comment: REPEATED TO VERIFY   nRBC 0.3 (H) 0.0 - 0.2 %   Neutrophils Relative % 80 %   Neutro Abs 5.9 1.7 - 7.7 K/uL   Lymphocytes Relative 13 %   Lymphs Abs 1.0 0.7 - 4.0 K/uL   Monocytes Relative 6 %   Monocytes Absolute 0.5 0.1 - 1.0 K/uL   Eosinophils Relative 0 %   Eosinophils Absolute 0.0 0.0 - 0.5 K/uL   Basophils Relative 1 %   Basophils Absolute 0.0 0.0 -  0.1 K/uL   Immature Granulocytes 0 %   Abs Immature Granulocytes 0.03 0.00 - 0.07 K/uL    Comment: Performed at [facility], [address] [place], [place] [address]  Troponin I (High Sensitivity)     Status: Abnormal   Collection Time: 02/03/24  4:21 PM  Result Value Ref Range   Troponin I (High Sensitivity) 20 (H) <18 ng/L    Comment: (NOTE) Elevated high sensitivity troponin I (hsTnI) values and significant  changes across serial measurements may suggest ACS but many other  chronic and acute conditions are known to elevate hsTnI results.  Refer to the "Links" section for chest pain algorithms and additional  guidance. Performed at [facility], [address] [place], [place] [address]   Urinalysis, Routine w reflex microscopic -Urine, Clean Catch     Status: None   Collection Time: 02/03/24  4:21 PM  Result Value Ref Range   Color, Urine YELLOW YELLOW   APPearance CLEAR CLEAR   Specific Gravity, Urine 1.011 1.005 - 1.030  pH 6.0 5.0 - 8.0   Glucose, UA NEGATIVE NEGATIVE mg/dL   Hgb urine dipstick NEGATIVE NEGATIVE   Bilirubin Urine NEGATIVE NEGATIVE   Ketones, ur NEGATIVE NEGATIVE mg/dL   Protein, ur NEGATIVE NEGATIVE mg/dL   Nitrite NEGATIVE NEGATIVE   Leukocytes,Ua NEGATIVE NEGATIVE    Comment: Performed at [facility], [address], [place], [place] [address]  POC CBG, ED     Status: Abnormal   Collection Time: 02/03/24  4:38 PM  Result Value Ref Range   Glucose-Capillary 143 (H) 70 - 99 mg/dL    Comment: Glucose reference range applies only to samples taken after fasting for at least 8 hours.  Troponin I (High Sensitivity)     Status: None   Collection Time: 02/03/24  7:18 PM  Result Value Ref Range   Troponin I (High Sensitivity) 17 <18 ng/L    Comment: (NOTE) Elevated high sensitivity troponin I (hsTnI) values and significant  changes across serial measurements may suggest ACS but many other  chronic and acute conditions are known to  elevate hsTnI results.  Refer to the "Links" section for chest pain algorithms and additional  guidance. Performed at [facility], [address], [place], [place] [address]   Basic Metabolic Panel (BMET)     Status: Abnormal   Collection Time: 02/08/24 11:42 AM  Result Value Ref Range   Glucose 106 (H) 70 - 99 mg/dL   BUN 25 8 - 27 mg/dL   Creatinine, Ser 0.98 0.76 - 1.27 mg/dL   eGFR 61 >11 BJ/YNW/2.95   BUN/Creatinine Ratio 21 10 - 24   Sodium 143 134 - 144 mmol/L   Potassium 4.4 3.5 - 5.2 mmol/L   Chloride 106 96 - 106 mmol/L   CO2 23 20 - 29 mmol/L   Calcium  9.5 8.6 - 10.2 mg/dL        Carnell Christian, MD, MS

## 2024-03-08 NOTE — Patient Instructions (Signed)
 VISIT SUMMARY:  Today, we discussed your episodes of unresponsiveness and low blood pressure. We reviewed your current medications and their potential impact on your symptoms. We also talked about your history of hypertension, coronary artery disease, benign prostatic hyperplasia, hyperlipidemia, and bladder cancer. Adjustments to your medication timing have already shown some improvement in your symptoms.  YOUR PLAN:  -HYPERTENSION: Hypertension is high blood pressure, which can lead to serious health problems if not managed properly. Your episodes of unresponsiveness are likely due to medication-induced low blood pressure. We will refer you to a specialty resistant blood pressure clinic for further management. Continue your current medications with separated dosing, monitor your blood pressure regularly, and ensure you stay hydrated and well-nourished. We will also discuss with your urologist the possibility of changing your tamsulosin.  -ORTHOSTATIC HYPOTENSION: Orthostatic hypotension is a form of low blood pressure that happens when you stand up from sitting or lying down, which can cause dizziness or fainting. Your episodes are likely due to medication interactions and dehydration. We will refer you to a specialty resistant blood pressure clinic. Ensure you stay hydrated and well-nourished, and we will discuss with your urologist the possibility of changing your tamsulosin.  -BENIGN PROSTATIC HYPERPLASIA WITH LOWER URINARY TRACT SYMPTOMS: Benign prostatic hyperplasia is an enlarged prostate gland that can cause urinary symptoms like urgency and frequency. You are currently taking tamsulosin and Robetric for this condition. We will discuss with your urologist the possibility of changing your tamsulosin to avoid its impact on your blood pressure. Continue taking Robetric.  -CORONARY ARTERY DISEASE: Coronary artery disease is a condition where the blood vessels supplying the heart are narrowed or  blocked. You are managing this with isosorbide  mononitrate, which helps to widen your blood vessels. Continue taking this medication and follow up with your cardiologist as scheduled.  -HYPERLIPIDEMIA: Hyperlipidemia is having high levels of fats (lipids) in your blood, which can increase your risk of heart disease. You are managing this with Repatha  due to intolerance to statins. There are no acute issues at this time.  -BLADDER CANCER: Bladder cancer is a type of cancer that begins in the cells of the bladder. You are being monitored regularly by your urologist, and there are no acute issues at this time. Continue with your regular follow-ups.  INSTRUCTIONS:  Please follow up in 3 months to reassess your blood pressure management and overall health. Return sooner if your symptoms worsen or if you develop any new symptoms.

## 2024-03-18 ENCOUNTER — Telehealth: Payer: Self-pay | Admitting: Family Medicine

## 2024-03-18 DIAGNOSIS — I1A0 Resistant hypertension: Secondary | ICD-10-CM

## 2024-03-18 NOTE — Telephone Encounter (Signed)
 I looked at patient's medical release form signed on 03/08/24 and patient's daughter Ricardo [name] is not named on form.   I called and spoke with patient's daughter and notified her that I can not give out patient information due to her name not being on release of information form.  Ricardo [name] called her father on three way and Ricardo [name] gave consent to speak with his daughter and will fill out another medical release form at next office visit.  Patient said that he was supposed to be referral to a hypertension clinic but has not heard from anyone. I do not see a referral for one in the system.

## 2024-03-18 NOTE — Telephone Encounter (Signed)
 Copied from CRM [id number]. Topic: Appointments - Scheduling Inquiry for Clinic >> Mar 17, 2024  4:30 PM Traseandia E wrote: Reason for CRM: Pt's daughter Edwina Gram would like CB regarding the appt schedule for the hypertension clinic per pt's last visit 03/08/2024.

## 2024-03-24 DIAGNOSIS — I1A0 Resistant hypertension: Secondary | ICD-10-CM | POA: Insufficient documentation

## 2024-03-24 NOTE — Addendum Note (Signed)
 Addended by: Catheryn Cluck on: 03/24/2024 01:38 PM   Modules accepted: Orders

## 2024-04-11 NOTE — Progress Notes (Unsigned)
 Cardiology Office Note:  .   Date:  04/13/2024  ID:  Ricardo [name], DOB March 16, 1941, MRN [id number] PCP: Catheryn Cluck, MD  [facility] Providers Cardiologist:  Oneil Bigness, MD { History of Present Illness: .    Chief Complaint  Patient presents with   Follow-up    Ricardo [name] is a 83 y.o. male with history of HTN, AAA, CAD, carotid artery disease, HLD who presents for follow-up. BP meds were adjusted due to hypotension.    History of Present Illness   Ricardo [name] is an 83 year old male with hypertension and coronary artery disease who presents for follow-up regarding blood pressure fluctuations.  He experiences ongoing fluctuations in blood pressure, with both high and low readings. Dizziness occurs when his blood pressure is particularly low, though this is infrequent. He has had two emergency room visits since March due to low blood pressure, with one visit resulting in a systolic reading as low as 44 mmHg. During one of these visits, he was taken by ambulance and underwent a heart ultrasound, which was normal.  His current medications include a clonidine  patch (0.2 mg weekly), chlorthalidone  (12.5 mg daily), Imdur  (60 mg daily), and telmisartan  (80 mg daily). He has stopped taking supplements to better assess the effects of his medications. He wants to discontinue the clonidine  patch, which was initially prescribed by a previous doctor. He is also on Flomax (0.4 mg daily) for prostate issues.  No recent falls, dizziness, lightheadedness, chest pains, or trouble breathing, except when his blood pressure is low. He maintains adequate hydration, consuming 4-6 glasses of fluids daily, and regularly monitors his blood pressure.          Problem List 1. Moderate CAD -Prior RCA stent 2001 -LHC 07/10/2020: 50% LAD; 40% RCA; 60% OM2 -NM 08/16/2021: normal  2. AAA -3.6 cm 2020 -3.8 cm 2022 -3.8 cm 2023 -4.2 cm  2024 3. Carotid artery disease  -R ICA 1-39% -L ICA  1-39% 4. HLD -T chol 103, HDL, LDL 33, TG 158 5. HTN    ROS: All other ROS reviewed and negative. Pertinent positives noted in the HPI.     Studies Reviewed: Aaron Aas        TTE 02/23/2024 SUMMARY  Normal LV size, wall thickness, wall motion and systolic function with ejection fraction 55-60%  The left ventricular diastolic function is probably normal for age, with likely normal left atrial  pressure.  The left ventricular wall motion is normal.  The right ventricular cavity is small.  The right ventricular systolic function is normal.  The atria are normal in size.  There is no significant stenosis or regurgitation.  The inferior vena cava was not visualized during the exam.  There is no pericardial effusion.  There is no prior study for comparison.  Physical Exam:   VS:  BP (!) 176/80 (BP Location: Right Arm, Patient Position: Sitting, Cuff Size: Normal)   Pulse (!) 58   Ht 5\' 6"  (1.676 m)   Wt 177 lb (80.3 kg)   SpO2 95%   BMI 28.57 kg/m    Wt Readings from Last 3 Encounters:  04/13/24 177 lb (80.3 kg)  03/08/24 174 lb 6.4 oz (79.1 kg)  02/04/24 173 lb 9.6 oz (78.7 kg)    GEN: Well nourished, well developed in no acute distress NECK: No JVD; No carotid bruits CARDIAC: RRR, no murmurs, rubs, gallops RESPIRATORY:  Clear to auscultation without rales, wheezing or rhonchi  ABDOMEN:  Soft, non-tender, non-distended EXTREMITIES:  No edema; No deformity  ASSESSMENT AND PLAN: .   Assessment and Plan    Hypertension Fluctuating blood pressure with episodes of hypotension. Clonidine  patch may contribute to instability. Goal to stabilize blood pressure and prevent hypotension. - Prescribe clonidine  0.1 mg patch for one week, then discontinue. - Prescribe hydralazine  25 mg as needed for systolic blood pressure over 160 mmHg, up to three times a day. - Continue chlorthalidone  12.5 mg daily, Imdur  60 mg daily, and telmisartan  80 mg daily. - Order renal duplex ultrasound to evaluate for  renal artery stenosis. - Encourage regular blood pressure monitoring, especially during dizziness. - Advise on adequate fluid intake and regular exercise.   CAD - no angina. Continue current meds.   Abdominal aortic aneurysm Aneurysm measuring 42 mm. Monitoring with vascular surgery scheduled. - Continue follow-up with vascular surgery for abdominal aortic aneurysm. - Schedule ultrasound of the abdomen in July.  Hyperlipidemia Well-controlled with Repatha , LDL at 33 mg/dL.  Benign prostatic hyperplasia Managed with Flomax, which may contribute to hypotension. - Continue Flomax 0.4 mg daily.              Follow-up: Return in about 6 weeks (around 05/25/2024).   Signed, Gigi Kyle. Rolm Clos, MD, [facility]  [facility]  [address], [address] [place], [place] [address] [phone]  2:44 PM

## 2024-04-13 ENCOUNTER — Ambulatory Visit: Attending: Cardiovascular Disease | Admitting: Cardiovascular Disease

## 2024-04-13 ENCOUNTER — Encounter: Payer: Self-pay | Admitting: Cardiovascular Disease

## 2024-04-13 VITALS — BP 176/80 | HR 58 | Ht 66.0 in | Wt 177.0 lb

## 2024-04-13 DIAGNOSIS — I6523 Occlusion and stenosis of bilateral carotid arteries: Secondary | ICD-10-CM | POA: Diagnosis not present

## 2024-04-13 DIAGNOSIS — I2510 Atherosclerotic heart disease of native coronary artery without angina pectoris: Secondary | ICD-10-CM | POA: Diagnosis not present

## 2024-04-13 DIAGNOSIS — I951 Orthostatic hypotension: Secondary | ICD-10-CM | POA: Diagnosis not present

## 2024-04-13 DIAGNOSIS — E785 Hyperlipidemia, unspecified: Secondary | ICD-10-CM

## 2024-04-13 DIAGNOSIS — I714 Abdominal aortic aneurysm, without rupture, unspecified: Secondary | ICD-10-CM

## 2024-04-13 DIAGNOSIS — I10 Essential (primary) hypertension: Secondary | ICD-10-CM | POA: Diagnosis not present

## 2024-04-13 MED ORDER — CLONIDINE 0.1 MG/24HR TD PTWK: 0.1000 mg | MEDICATED_PATCH | TRANSDERMAL | Status: DC

## 2024-04-13 MED ORDER — TELMISARTAN 80 MG PO TABS: 80.0000 mg | ORAL_TABLET | Freq: Every day | ORAL | 3 refills | Status: DC

## 2024-04-13 MED ORDER — CHLORTHALIDONE 25 MG PO TABS: 12.5000 mg | ORAL_TABLET | Freq: Every day | ORAL | 3 refills | Status: DC

## 2024-04-13 MED ORDER — HYDRALAZINE HCL 25 MG PO TABS
ORAL_TABLET | ORAL | 6 refills | Status: DC
Start: 2024-04-13 — End: 2024-05-25

## 2024-04-13 MED ORDER — ISOSORBIDE MONONITRATE ER 30 MG PO TB24
30.0000 mg | ORAL_TABLET | Freq: Every day | ORAL | 3 refills | Status: DC
Start: 2024-04-13 — End: 2024-06-09

## 2024-04-13 NOTE — Patient Instructions (Addendum)
 Medication Instructions:  Complete Clonidine  0.2 mg patch  start clonidine  0.1 mg for one week then stop  Hydralazine  12.5 mg  as needed for blood pressure greater than 160 systolic   *If you need a refill on your cardiac medications before your next appointment, please call your pharmacy*   Lab Work: Not needed    Testing/Procedures: Your physician has requested that you have a renal artery duplex. During this test, an ultrasound is used to evaluate blood flow to the kidneys. Allow one hour for this exam. Do not eat after midnight the day before and avoid carbonated beverages. Take your medications as you usually do.    Follow-Up: At [facility], you and your health needs are our priority.  As part of our continuing mission to provide you with exceptional heart care, we have created designated Provider Care Teams.  These Care Teams include your primary Cardiologist (physician) and Advanced Practice Providers (APPs -  Physician Assistants and Nurse Practitioners) who all work together to provide you with the care you need, when you need it.     Your next appointment:   6 week(s)  The format for your next appointment:   In Person  Provider:   Oneil Bigness, MD or One of our Advanced Practice Providers (APPs): Melita Springer, PA-C  Friddie Jetty, NP Evaline Hill, NP  Theotis Flake, PA-C Lawana Pray, NP  Willis Harter, PA-C Lovette Rud, PA-C  [place], PA-C Ernest Dick, NP  Marlana Silvan, NP Marcie Sever, PA-C  Laquita Plant, PA-C    Dayna Dunn, PA-C  Scott Weaver, PA-C Palmer Bobo, NP Katlyn West, NP Callie Goodrich, PA-C  Evan Williams, PA-C Sheng Haley, PA-C  Xika Zhao, NP Kathleen Johnson, PA-C   Then, Oneil Bigness, MD will plan to see you again in 6 month(s).   Other Instructions

## 2024-04-27 ENCOUNTER — Telehealth: Payer: Self-pay

## 2024-04-27 ENCOUNTER — Ambulatory Visit: Admission: EM | Admit: 2024-04-27 | Discharge: 2024-04-27 | Disposition: A | Discharge status: Home / Self Care

## 2024-04-27 ENCOUNTER — Encounter: Payer: Self-pay | Admitting: Emergency Medicine

## 2024-04-27 DIAGNOSIS — I10 Essential (primary) hypertension: Secondary | ICD-10-CM | POA: Diagnosis not present

## 2024-04-27 NOTE — Telephone Encounter (Signed)
 Spoke with pt after receiving an email communication from the referral team manager explaining the proper process for closed referrals to inform the pt that no communication was received to the clinic to inform us  that he was not a candidate at the [facility]. Pt expresses understanding and has no further questions.   Also asked pt how he was feeling and how his BP was. Pt states he is not 100%  but feels better. Advised pt to keep PCP and cardiology appts so both providers can work to find the optimal medication therapy for him to control his BP and to take care.

## 2024-04-27 NOTE — ED Provider Notes (Signed)
 [facility]  Note:  This document was prepared using Dragon voice recognition software and may include unintentional dictation errors.  MRN: [id number] DOB: 1941/09/29  Subjective:   Ricardo [name] is a 83 y.o. male presenting for blood pressure control issues and confusion over blood pressure medication regimen.  Patient reports that this morning when he woke up he checked his blood pressure which was 200+ systolic which prompted him to take recently prescribed hydralazine  25 mg.  Patient reports that a later blood pressure reading read 111 systolic.  Patient has significant history of hypertension and previous MI.  Patient states that his primary care provider Dr. Lucy Sack recently prescribed hydralazine  for tighter blood pressure control for elevated blood pressure readings above 160 systolic.  Patient was confused to whether or not he was supposed to take hydralazine  scheduled in the morning with his daily blood pressure medication or if it was only to be used as needed for blood pressure above 160 systolic.  Patient denies any chest pain, shortness of breath, weakness, dizziness.  Patient reports that when his blood pressure was extremely elevated he did have some lightheadedness but quickly subsided with improvement of blood pressure.  No current facility-administered medications for this encounter.  Current Outpatient Medications:    acetaminophen  (TYLENOL ) 500 MG tablet, Take 1,000 mg by mouth daily as needed for headache (pain)., Disp: , Rfl:    aspirin  81 MG tablet, Take 1 tablet (81 mg total) by mouth daily., Disp: 1 tablet, Rfl: 0   chlorthalidone  (HYGROTON ) 25 MG tablet, Take 0.5 tablets (12.5 mg total) by mouth daily., Disp: 45 tablet, Rfl: 3   cloNIDine  (CATAPRES  - DOSED IN MG/24 HR) 0.1 mg/24hr patch, Place 1 patch (0.1 mg total) onto the skin once a week., Disp: 1 patch, Rfl: O   Evolocumab  (REPATHA  SURECLICK) 140 MG/ML SOAJ, Inject 140 mg into the skin See  admin instructions. Inject 140 mg subcutaneously on the 1st and 15th of each month, Disp: 2 mL, Rfl: 11   hydrALAZINE  (APRESOLINE ) 25 MG tablet, Take  25 mg as need for blood pressure greater than 160 systolic, Disp: 30 tablet, Rfl: 6   isosorbide  mononitrate (IMDUR ) 30 MG 24 hr tablet, Take 1 tablet (30 mg total) by mouth daily., Disp: 90 tablet, Rfl: 3   MYRBETRIQ 25 MG TB24 tablet, Take 25 mg by mouth daily., Disp: , Rfl:    nitroGLYCERIN  (NITROSTAT ) 0.4 MG SL tablet, Place 1 tablet (0.4 mg total) under the tongue every 5 (five) minutes as needed., Disp: 25 tablet, Rfl: 3   Red Yeast Rice Extract (RED YEAST RICE PO), Take 1,200 mg by mouth daily., Disp: , Rfl:    rosuvastatin  (CRESTOR ) 5 MG tablet, Take 1 tablet (5 mg total) by mouth daily., Disp: 90 tablet, Rfl: 1   tamsulosin (FLOMAX) 0.4 MG CAPS capsule, Take 0.4 mg by mouth daily after supper., Disp: , Rfl:    telmisartan  (MICARDIS ) 80 MG tablet, Take 1 tablet (80 mg total) by mouth daily., Disp: 90 tablet, Rfl: 3   TURMERIC PO, Take by mouth daily. 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 Sharyn Deforest   Coronary artery disease CARDIOLOGIST- DR Anastasia Balo-  LAST VISIT 2 WKS  AGO-- WILL REQUEST NOTE AND EKG   STRESS TEST -- JULY 2009   Essential hypertension 08/30/2018   Hyperlipidemia    Hypertension    Nocturia    Personal history of malignant neoplasm of bladder 08/30/2018   Post PTCA 2001-   X1 STENT TO RCA   AND 1996     Past Surgical History:  Procedure Laterality Date   CORONARY ANGIOPLASTY  1996   CORONARY ANGIOPLASTY WITH STENT PLACEMENT  2001   STENT TO RCA   CYSTO/ BLADDER BX  10-22-10   CYSTOSCOPY W/ RETROGRADES  11/12/2011   Procedure: CYSTOSCOPY WITH RETROGRADE PYELOGRAM;  Surgeon: Soledad Dupes, MD;  Location: [facility];  Service: Urology;  Laterality: Bilateral;   LEFT HEART CATH AND CORONARY ANGIOGRAPHY N/A 07/09/2020   Procedure: LEFT HEART CATH AND CORONARY ANGIOGRAPHY;  Surgeon: Pasqual Bone, MD;  Location: [facility];  Service: Cardiovascular;  Laterality: N/A;   LUMBAR FUSION     TRANSURETHRAL RESECTION OF BLADDER TUMOR  08-27-10   RIGHT ANTERIOR DOME   TRANSURETHRAL RESECTION OF BLADDER TUMOR  11/12/2011   Procedure: TRANSURETHRAL RESECTION OF BLADDER TUMOR (TURBT);  Surgeon: Soledad Dupes, MD;  Location: [facility];  Service: Urology;  Laterality: N/A;  with PK Gyrus c-arm gyrus  digital ureteroscope    Family History  Problem Relation Age of Onset   Diabetes Mother    Hypertension Mother     Social History   Tobacco Use   Smoking status: Former    Current packs/day: 0.00    Average packs/day: 1.5 packs/day for 5.0 years (7.5 ttl pk-yrs)    Types: Cigarettes    Start date: 11/05/1960    Quit date: 11/05/1965    Years since quitting: 58.5   Smokeless tobacco: Never  Vaping Use   Vaping status: Never Used  Substance Use Topics   Alcohol use: No   Drug use: No    ROS Refer to HPI for ROS details.  Objective:    Vitals: BP (!) 158/84 (BP Location: Right Arm)   Pulse 73   Temp 97.9 F (36.6 C) (Oral)   Resp 16   Ht 5' 6 (1.676 m)   Wt 174 lb  (78.9 kg)   SpO2 94%   BMI 28.08 kg/m   Physical Exam Vitals and nursing note reviewed.  Constitutional:      General: He is not in acute distress.    Appearance: Normal appearance. He is well-developed. He is not ill-appearing or toxic-appearing.  HENT:     Head: Normocephalic and atraumatic.     Mouth/Throat:     Mouth: Mucous membranes are moist.  Cardiovascular:     Rate and Rhythm: Normal rate and regular rhythm.     Heart sounds: Normal heart sounds. No murmur heard. Pulmonary:     Effort: Pulmonary effort is normal. No respiratory  distress.     Breath sounds: Normal breath sounds. No stridor. No wheezing, rhonchi or rales.  Chest:     Chest wall: No tenderness.  Skin:    General: Skin is warm and dry.  Neurological:     General: No focal deficit present.     Mental Status: He is alert and oriented to person, place, and time.  Psychiatric:        Mood and Affect: Mood normal.        Behavior: Behavior normal.     Procedures  No results found for this or any previous visit (from the past 24 hours).  Assessment and Plan :     Discharge Instructions       1. Hypertension complications (Primary) - EKG 12-Lead completed in UC shows normal sinus rhythm, ventricular rate of 69 bpm, normal EKG, no STEMI.  No significant sign of LVH. - As discussed only take hydralazine  25 mg tablet as needed for a systolic blood pressure over 160 per Dr. Lucy Sack. - Otherwise take blood pressure medications prescribed for blood pressure control as directed throughout the day. - Telmisartan , Imdur , chlorthalidone  are your daily blood pressure medications that should be taken as scheduled. - Please check your blood pressure throughout the day especially first thing in the morning before taking blood pressure medication.  If blood pressure in the morning is above 110 systolic take regular blood pressure medication, but do not take hydralazine  unless blood pressure is above 160 systolic and  you have already taken your regular blood pressure medication regimen. - After taking regular blood pressure meds if blood pressure remains above 160 systolic you may take hydralazine  25 mg. -Continue to monitor symptoms for any change in severity if there is any escalation of current symptoms or development of new symptoms such as chest pain, shortness of breath, weakness, dizziness, fatigue, blurred vision, severe headache follow-up in ER for further evaluation and management.    Huy Majid B Valda Christenson   Aristidis Talerico, Sharon B, [place] 04/27/24 1326

## 2024-04-27 NOTE — Discharge Instructions (Addendum)
  1. Hypertension complications (Primary) - EKG 12-Lead completed in UC shows normal sinus rhythm, ventricular rate of 69 bpm, normal EKG, no STEMI.  No significant sign of LVH. - As discussed only take hydralazine  25 mg tablet as needed for a systolic blood pressure over 160 per Dr. Lucy Sack. - Otherwise take blood pressure medications prescribed for blood pressure control as directed throughout the day. - Telmisartan , Imdur , chlorthalidone  are your daily blood pressure medications that should be taken as scheduled. - Please check your blood pressure throughout the day especially first thing in the morning before taking blood pressure medication.  If blood pressure in the morning is above 110 systolic take regular blood pressure medication, but do not take hydralazine  unless blood pressure is above 160 systolic and you have already taken your regular blood pressure medication regimen. - After taking regular blood pressure meds if blood pressure remains above 160 systolic you may take hydralazine  25 mg. -Continue to monitor symptoms for any change in severity if there is any escalation of current symptoms or development of new symptoms such as chest pain, shortness of breath, weakness, dizziness, fatigue, blurred vision, severe headache follow-up in ER for further evaluation and management.    From Dr Lucy Sack: Hypertension Fluctuating blood pressure with episodes of hypotension. Clonidine  patch may contribute to instability. Goal to stabilize blood pressure and prevent hypotension. - Prescribe clonidine  0.1 mg patch for one week, then discontinue. - Prescribe hydralazine  25 mg as needed for systolic blood pressure over 160 mmHg, up to three times a day. - Continue chlorthalidone  12.5 mg daily, Imdur  60 mg daily, and telmisartan  80 mg daily. - Order renal duplex ultrasound to evaluate for renal artery stenosis. - Encourage regular blood pressure monitoring, especially during dizziness. - Advise on  adequate fluid intake and regular exercise.

## 2024-04-27 NOTE — Telephone Encounter (Signed)
 Pt walked in to the office reporting home BP 204/9x, HR 86, lightheadedness. Pt LOV with PCP was 03/08/24. FOV 06/07/24 as instructed by PCP.   Consulted with the provider in the office with an opening this afternoon, who advises pt go to UC due to experiencing symptoms along with elevated BP reading. Pt and family informed of the recommendation.  The patient's son was instantly unhappy with the recommendation, stating the provider who made the recommendation does not know his dad's history, expressing that they are being sent in circles. The son reports that Dr.Thompson referred pt to [facility] clinic, they walked into the office to inquire about the referral and was told to give them about a week or two and if they do not hear anything to call us , and they never heard anything. I informed the family of a note in the referral that states the pt does not qualify for the clinic, that I was not knowledgeable of the clinic or their requirements/qualifying criteria and the only way I would know would be to call their office and ask. Son asked when was someone going to inform them that his dad did not qualify. I explained that we have a referral coordinator that handles referrals and unfortunately I could not attest that she informed Dr.Thompson or not. I provide family with detail instructions on how to locate the [facility]. Pt states he knows where it is. Son insists that I provide the specific address. I write the address and phone number on a sticky note and provide to family. The patient's wife then stand up, moves in closer to me and states that she is hard of hearing and tried to catch everything that I just said but she wanted to make sure she understands, proceeding to ask me again about who is responsible for contacting the patient regarding the referral. The son stated he did not feel his dad was getting the care he needed and that this BP issue has been going on for years. I reminded the  family that pt just established care with Dr. Hildy Lowers in April of this year, instructed pt to follow up in July - I understand it is frustrating and may feel as if you are receiving the run around, but realistically Dr.Thompson is just learning him as a pt and it sometimes does require trail and error of the treatment plan until the effective medication drug therapy is discovered. Family and pt thanked me for my help and exited the office.

## 2024-04-27 NOTE — ED Triage Notes (Signed)
 Pt presents due to bp being elevated this morning. States it was 200s/90s and then later systolic dropped to 111. At UC BP 176/80. Denies chest pain or SOB. Has experience lightheadedness.

## 2024-05-02 ENCOUNTER — Ambulatory Visit (HOSPITAL_COMMUNITY)
Admission: RE | Admit: 2024-05-02 | Discharge: 2024-05-02 | Disposition: A | Discharge status: Home / Self Care | Source: Ambulatory Visit | Attending: Cardiovascular Disease | Admitting: Cardiovascular Disease

## 2024-05-02 DIAGNOSIS — E785 Hyperlipidemia, unspecified: Secondary | ICD-10-CM

## 2024-05-02 DIAGNOSIS — I951 Orthostatic hypotension: Secondary | ICD-10-CM | POA: Diagnosis not present

## 2024-05-02 DIAGNOSIS — I6523 Occlusion and stenosis of bilateral carotid arteries: Secondary | ICD-10-CM | POA: Diagnosis not present

## 2024-05-02 DIAGNOSIS — I10 Essential (primary) hypertension: Secondary | ICD-10-CM | POA: Diagnosis not present

## 2024-05-02 DIAGNOSIS — I714 Abdominal aortic aneurysm, without rupture, unspecified: Secondary | ICD-10-CM

## 2024-05-02 DIAGNOSIS — I2510 Atherosclerotic heart disease of native coronary artery without angina pectoris: Secondary | ICD-10-CM

## 2024-05-08 ENCOUNTER — Ambulatory Visit: Payer: Self-pay | Admitting: Cardiovascular Disease

## 2024-05-13 NOTE — Progress Notes (Signed)
 Cardiology Office Note   Date:  05/25/2024  ID:  Ricardo [name], Ricardo [name] 12/27/1940, MRN [id number] PCP: Sebastian Beverley NOVAK, MD  [facility] Providers Cardiologist:  Darryle ONEIDA Decent, MD   History of Present Illness Ricardo [name] is a 83 y.o. male with a past medical history of CAD, HLD, HTN, AAA, orthostatic hypotension, carotid artery disease. Followed by Dr. Decent and presents for a 6 week follow up appointment   Patient previously had RCA stent placed in 2001. Admitted in 06/2020 for unstable angina. Cath on 07/09/20 showed mild-moderate multivessel disease that was managed medically. Admitted in 04/2021 with chest pain, hypertensive emergency and bradycardia. Echocardiogram showed EF 60-65%, no regional wall motion abnormalities, mild LVH, grade I DD, normal RV systolic function, no significant valvular abnormalities. Cardiac monitor in 07/2021 showed no significant arrhythmias and rare ectopy. Nuclear stress test in 07/2021 showed no ischemia or infarction.   Patient has a history of AAA. Most recent ultrasound from 03/2022 showed evidence of abnormal dilatation of the distal abdominal aorta, largest measurement 3.6 cm. Also has history of carotid artery disease, ultrasounds from 09/2023 showed 1-39% stenosis in the right ICA, near-normal Left ICA.   Patient was last seen by Dr. Decent on 04/13/24. At that time, patient had fluctuations in his BP. Had dizziness when BP was low. When evaluated in clinic, his BP was elevated to 176/80. He was weaned off clonidine . Started on hydralazine  25 mg as needed for SBP 160 mmHg (up to 3 times per day). Remained on chlorthalidone  12.5 mg daily, imdur  30 mg daily, telmisartan  80 mg daily. He underwent renal ultrasounds 04/2024 that showed 1-59% stenosis in bilateral renal arteries   Today, patient reports that he has been feeling well.  No chest pain or shortness of breath.  He has continued to have flux in his blood pressure.  He checks his blood pressure  once per day and if elevated >160 systolic he takes a dose of hydralazine .  Reports that his blood pressure is often in the 160s systolic, highest was 204 systolic.  Lowest was 112 systolic.  He denies headaches, chest pain.  Denies dizziness, syncope, near syncope.  Patient is able to walk around stores and do housework/yard work but physical activity is somewhat limited by his knee pain.  He occasionally eats salt but not very much.  We discussed that he could take hydralazine  up to 3 times per day and I encouraged him to check his blood pressure once in the morning, once in the early afternoon, and once before bed to decide if he needs hydralazine .  He is agreeable to this plan.  He is off clonidine    Studies Reviewed  Cardiac Studies & Procedures   ______________________________________________________________________________________________ CARDIAC CATHETERIZATION  CARDIAC CATHETERIZATION 07/09/2020  Conclusion  Ost LAD to Prox LAD lesion is 50% stenosed.  Mid LAD to Dist LAD lesion is 50% stenosed.  Ost RCA lesion is 40% stenosed.  Prox RCA lesion is 40% stenosed.  Mid RCA lesion is 40% stenosed.  2nd Diag lesion is 50% stenosed.  2nd Mrg lesion is 60% stenosed.  Mid Cx lesion is 30% stenosed.  Mild to moderate multivessel disease. Add isosorbide  and ASA and Plavix  for 6 months and life style modification.  Findings Coronary Findings Diagnostic  Dominance: Right  Left Main Vessel was injected. Vessel is normal in caliber. Vessel is angiographically normal.  Left Anterior Descending Vessel was injected. Vessel is normal in caliber. Ost LAD to Prox LAD lesion is  50% stenosed. Vessel is not the culprit lesion. The lesion is type A, focal and concentric. The lesion was not previously treated. The stenosis was measured by a visual reading. Pressure wire/FFR was not performed on the lesion. IVUS was not performed. Mid LAD to Dist LAD lesion is 50% stenosed. Vessel is not the  culprit lesion. The lesion is type C, tubular and concentric. The lesion was not previously treated. The stenosis was measured by a visual reading. Pressure wire/FFR was not performed on the lesion. IVUS was not performed.  Second Diagonal Branch 2nd Diag lesion is 50% stenosed. Vessel is not the culprit lesion. The lesion is type C and concentric. The lesion was not previously treated. The stenosis was measured by a visual reading. Pressure wire/FFR was not performed on the lesion. IVUS was not performed.  Left Circumflex Vessel was injected. Vessel is very large. Mid Cx lesion is 30% stenosed. Vessel is not the culprit lesion. The lesion is type A and concentric. The lesion was not previously treated. The stenosis was measured by a visual reading. Pressure wire/FFR was not performed on the lesion. IVUS was not performed.  Second Obtuse Marginal Branch 2nd Mrg lesion is 60% stenosed. Vessel is not the culprit lesion. The lesion is type C and concentric. The lesion was not previously treated. The stenosis was measured by a visual reading. Pressure wire/FFR was not performed on the lesion. IVUS was not performed.  Right Coronary Artery Vessel was injected. Vessel is moderate in size. Ost RCA lesion is 40% stenosed. Vessel is not the culprit lesion. The lesion is type A and concentric. The lesion is mildly calcified. The lesion was not previously treated. The stenosis was measured by a visual reading. Pressure wire/FFR was not performed on the lesion. IVUS was not performed. Significant spasm, relived with IC nitro 200 mcg. Prox RCA lesion is 40% stenosed. Vessel is not the culprit lesion. The lesion is type A and concentric. The lesion was not previously treated. The stenosis was measured by a visual reading. Pressure wire/FFR was not performed on the lesion. IVUS was not performed. Mid RCA lesion is 40% stenosed. Vessel is not the culprit lesion. The lesion is type A and concentric. The lesion was  not previously treated. The stenosis was measured by a visual reading. Pressure wire/FFR was not performed on the lesion. IVUS was not performed.  Intervention  No interventions have been documented.   STRESS TESTS  MYOCARDIAL PERFUSION IMAGING 08/16/2021  Interpretation Summary   Findings are consistent with no prior ischemia and no prior myocardial infarction. The study is intermediate risk due to reduced systolic function.   No ST deviation was noted.   LV perfusion is normal. There is no evidence of ischemia. There is no evidence of infarction.   Left ventricular function is abnormal. Global function is mildly reduced. There were no regional wall motion abnormalities. Nuclear stress EF: 49 %. The left ventricular ejection fraction is mildly decreased (45-54%). End diastolic cavity size is normal. End systolic cavity size is normal.   Prior study not available for comparison.  Negative stress test.   ECHOCARDIOGRAM  ECHOCARDIOGRAM COMPLETE 05/12/2021  Narrative ECHOCARDIOGRAM REPORT    Patient Name:   Ricardo [name] Date of Exam: 05/12/2021 Medical Rec #:  [id number]      Height:       69.0 in Accession #:    [id number]     Weight:       169.6 lb Date of Birth:  09-Mar-1941      BSA:          1.926 m Patient Age:    80 years       BP:           125/52 mmHg Patient Gender: M              HR:           61 bpm. Exam Location:  Inpatient  Procedure: 2D Echo, Cardiac Doppler and Color Doppler  Indications:    R07.9* Chest pain, unspecified  History:        Patient has prior history of Echocardiogram examinations, most recent 07/08/2020. CAD; Risk Factors:Hypertension and Dyslipidemia. Cancer.  Sonographer:    Tiffany Dance Referring Phys: [id number] RAVI PAHWANI   Sonographer Comments: Suboptimal subcostal window. IMPRESSIONS   1. Left ventricular ejection fraction, by estimation, is 60 to 65%. The left ventricle has normal function. The left ventricle has no regional wall  motion abnormalities. There is mild concentric left ventricular hypertrophy. Left ventricular diastolic parameters are consistent with Grade I diastolic dysfunction (impaired relaxation). 2. Right ventricular systolic function is normal. The right ventricular size is normal. Tricuspid regurgitation signal is inadequate for assessing PA pressure. 3. The mitral valve is grossly normal. Trivial mitral valve regurgitation. No evidence of mitral stenosis. 4. The aortic valve is tricuspid. Aortic valve regurgitation is trivial. No aortic stenosis is present.  FINDINGS Left Ventricle: Left ventricular ejection fraction, by estimation, is 60 to 65%. The left ventricle has normal function. The left ventricle has no regional wall motion abnormalities. The left ventricular internal cavity size was normal in size. There is mild concentric left ventricular hypertrophy. Left ventricular diastolic parameters are consistent with Grade I diastolic dysfunction (impaired relaxation).  Right Ventricle: The right ventricular size is normal. No increase in right ventricular wall thickness. Right ventricular systolic function is normal. Tricuspid regurgitation signal is inadequate for assessing PA pressure.  Left Atrium: Left atrial size was normal in size.  Right Atrium: Right atrial size was normal in size.  Pericardium: There is no evidence of pericardial effusion.  Mitral Valve: The mitral valve is grossly normal. Trivial mitral valve regurgitation. No evidence of mitral valve stenosis.  Tricuspid Valve: The tricuspid valve is grossly normal. Tricuspid valve regurgitation is trivial. No evidence of tricuspid stenosis.  Aortic Valve: The aortic valve is tricuspid. Aortic valve regurgitation is trivial. No aortic stenosis is present.  Pulmonic Valve: The pulmonic valve was grossly normal. Pulmonic valve regurgitation is not visualized. No evidence of pulmonic stenosis.  Aorta: The aortic root and ascending  aorta are structurally normal, with no evidence of dilitation.  Venous: The inferior vena cava was not well visualized.  IAS/Shunts: The atrial septum is grossly normal.   LEFT VENTRICLE PLAX 2D LVIDd:         3.30 cm  Diastology LVIDs:         2.60 cm  LV e' medial:    6.64 cm/s LV PW:         1.30 cm  LV E/e' medial:  8.7 LV IVS:        1.20 cm  LV e' lateral:   9.68 cm/s LVOT diam:     1.90 cm  LV E/e' lateral: 5.9 LV SV:         66 LV SV Index:   34 LVOT Area:     2.84 cm   RIGHT VENTRICLE RV Basal diam:  2.20 cm RV S prime:  11.30 cm/s TAPSE (M-mode): 2.0 cm  LEFT ATRIUM             Index       RIGHT ATRIUM           Index LA diam:        4.30 cm 2.23 cm/m  RA Area:     11.80 cm LA Vol (A2C):   74.8 ml 38.83 ml/m RA Volume:   23.50 ml  12.20 ml/m LA Vol (A4C):   44.7 ml 23.21 ml/m LA Biplane Vol: 58.4 ml 30.32 ml/m AORTIC VALVE LVOT Vmax:   98.50 cm/s LVOT Vmean:  63.700 cm/s LVOT VTI:    0.232 m  AORTA Ao Root diam: 3.30 cm Ao Asc diam:  3.30 cm  MITRAL VALVE MV Area (PHT): 2.73 cm    SHUNTS MV Decel Time: 278 msec    Systemic VTI:  0.23 m MV E velocity: 57.50 cm/s  Systemic Diam: 1.90 cm MV A velocity: 97.30 cm/s MV E/A ratio:  0.59  Darryle Decent MD Electronically signed by Darryle Decent MD Signature Date/Time: 05/12/2021/10:05:24 AM    Final    MONITORS  LONG TERM MONITOR (3-14 DAYS) 08/05/2021  Narrative Enrollment 07/12/2021-07/26/2021. Patient had a min HR of 38 bpm (sinus bradycardia), max HR of 171 bpm (supraventricular tachycardia, 2.6 second duration), and avg HR of 63 bpm (normal sinus rhythm). Predominant underlying rhythm was Sinus Rhythm. 12 Supraventricular Tachycardia runs occurred, the run with the fastest interval lasting 5 beats (2.6 second duration) with a max rate of 171 bpm, the longest lasting 13.0 secs (25 beats) with an avg rate of 119 bpm. Isolated SVEs were rare (<1.0%), SVE Couplets were rare (<1.0%), and SVE Triplets  were rare (<1.0%). Isolated VEs were rare (<1.0%), VE Couplets were rare (<1.0%), and no VE Triplets were present. Ventricular Bigeminy was present.  07/12/21 09:17pm lightheaded coincided with normal sinus rhythm 64 bpm. 07/13/21 03:44pm Hot flash/flushed coincided with normal sinus rhythm 67 bpm. 07/14/21 12:09am chest pain/pressure coincided with normal sinus rhythm 68 bpm. 07/14/21 12:21am left face pain coincided with normal sinus rhythm 64 bpm. 07/15/21 09:00am head throbbing, pounding coincided with normal sinus rhythm 74 bpm.  Impression: 1. No significant arrhythmias. 2. Rare ectopy.  Darryle T. O'Neal, MD, [facility] [address] [place], [place] [address] [phone] 8:04 AM       ______________________________________________________________________________________________       Risk Assessment/Calculations         Physical Exam VS:  BP (!) 148/90   Pulse 74   Ht 5' 6 (1.676 m)   Wt 179 lb (81.2 kg)   SpO2 97%   BMI 28.89 kg/m        Wt Readings from Last 3 Encounters:  05/25/24 179 lb (81.2 kg)  04/27/24 174 lb (78.9 kg)  04/13/24 177 lb (80.3 kg)    GEN: Well nourished, well developed in no acute distress. Sitting upright on the exam table  NECK: No JVD  CARDIAC:  RRR, no murmurs, rubs, gallops. Radial pulses 2+ bilaterally  RESPIRATORY:  Clear to auscultation without rales, wheezing or rhonchi. Normal WOB on room air   ABDOMEN: Soft, non-tender, non-distended EXTREMITIES:  No edema in BLE; No deformity   ASSESSMENT AND PLAN  Labile BP  - When seen by Dr. Decent 5/28, reported labile BP with systolic as low as the 40s, as high as the 170s. Clonidine  was stopped and he was started on PRN hydralazine   up to 3 times per day  - Patient reports that BP has continued to be labile, but he only checks it once per day. Highest SBP 204, lowest 112. Estimates average is around 160. No dizziness, syncope, near syncope,  headaches, chest pain  - Advised patient to check BP 3 times daily and take hydralazine  as needed for SBP>160  - Renal ultrasounds 04/2024 with 1-59% stenosis in bilateral renal arteries  - Continue imdur  30 mg daily  - Continue telmisartan  80 mg daily  - Continue chlorthalidone  12.5 mg daily  - Discussed low sodium diets, encouraged him to increase physical activity as tolerated. Encouraged him to stay hydrated  - K 4.4, creatinine 1.18 in 01/2024   CAD  - Patient previously had RCA stent placed in 2001. Cath in 06/2020 showed mild-moderate multivessel disease. Stress test in 07/2021 showed no ischemia or infarction  - Patient denies chest pain or DOE  - Continue ASA 81 mg daily  - Continue crestor  5 mg daily and repatha    AAA -  Followed by vascular surgery  - Due for ultrasound in 05/2024, this has already been scheduled and arranged   HLD  - LDL 33 in 10/2023  - continue repatha  and crestor  5 mg daily   BPH  - Continue flomax 0.4 mg daily   Carotid Artery Disease  - Ultrasounds from 09/2023 showed 1-39% stenosis in right ICA, near-normal left ICA - Continue ASA 81 mg daily  - Continue crestor  5 mg daily and repatha     Dispo: Follow up in 4 weeks with APP    Signed, Rollo FABIENE Louder, PA-C

## 2024-05-25 ENCOUNTER — Ambulatory Visit: Attending: Cardiovascular Disease | Admitting: Cardiology

## 2024-05-25 ENCOUNTER — Encounter: Payer: Self-pay | Admitting: Cardiology

## 2024-05-25 VITALS — BP 148/90 | HR 74 | Ht 66.0 in | Wt 179.0 lb

## 2024-05-25 DIAGNOSIS — I2510 Atherosclerotic heart disease of native coronary artery without angina pectoris: Secondary | ICD-10-CM | POA: Diagnosis not present

## 2024-05-25 DIAGNOSIS — I6523 Occlusion and stenosis of bilateral carotid arteries: Secondary | ICD-10-CM

## 2024-05-25 DIAGNOSIS — E785 Hyperlipidemia, unspecified: Secondary | ICD-10-CM | POA: Diagnosis not present

## 2024-05-25 DIAGNOSIS — I10 Essential (primary) hypertension: Secondary | ICD-10-CM

## 2024-05-25 DIAGNOSIS — I714 Abdominal aortic aneurysm, without rupture, unspecified: Secondary | ICD-10-CM

## 2024-05-25 MED ORDER — HYDRALAZINE HCL 25 MG PO TABS: ORAL_TABLET | ORAL | 6 refills | Status: DC

## 2024-05-25 NOTE — Patient Instructions (Signed)
 Medication Instructions:  No changes *If you need a refill on your cardiac medications before your next appointment, please call your pharmacy*  Lab Work: No labs If you have labs (blood work) drawn today and your tests are completely normal, you will receive your results only by: MyChart Message (if you have MyChart) OR A paper copy in the mail If you have any lab test that is abnormal or we need to change your treatment, we will call you to review the results.  Testing/Procedures: No testing  Follow-Up: At [facility], you and your health needs are our priority.  As part of our continuing mission to provide you with exceptional heart care, our providers are all part of one team.  This team includes your primary Cardiologist (physician) and Advanced Practice Providers or APPs (Physician Assistants and Nurse Practitioners) who all work together to provide you with the care you need, when you need it.  Your next appointment:   4 month(s)  Provider:   Hao Meng, PA-C   We recommend signing up for the patient portal called MyChart.  Sign up information is provided on this After Visit Summary.  MyChart is used to connect with patients for Virtual Visits (Telemedicine).  Patients are able to view lab/test results, encounter notes, upcoming appointments, etc.  Non-urgent messages can be sent to your provider as well.   To learn more about what you can do with MyChart, go to [email or web address].

## 2024-05-31 ENCOUNTER — Other Ambulatory Visit: Payer: Self-pay

## 2024-05-31 DIAGNOSIS — I7143 Infrarenal abdominal aortic aneurysm, without rupture: Secondary | ICD-10-CM

## 2024-06-07 ENCOUNTER — Ambulatory Visit (INDEPENDENT_AMBULATORY_CARE_PROVIDER_SITE_OTHER): Admitting: Family Medicine

## 2024-06-07 ENCOUNTER — Encounter: Payer: Self-pay | Admitting: Family Medicine

## 2024-06-07 VITALS — BP 140/80 | HR 94 | Temp 97.7°F | Ht 66.0 in | Wt 179.0 lb

## 2024-06-07 DIAGNOSIS — I7143 Infrarenal abdominal aortic aneurysm, without rupture: Secondary | ICD-10-CM | POA: Diagnosis not present

## 2024-06-07 DIAGNOSIS — G453 Amaurosis fugax: Secondary | ICD-10-CM | POA: Diagnosis not present

## 2024-06-07 DIAGNOSIS — I1A0 Resistant hypertension: Secondary | ICD-10-CM | POA: Diagnosis not present

## 2024-06-07 DIAGNOSIS — Z8551 Personal history of malignant neoplasm of bladder: Secondary | ICD-10-CM | POA: Diagnosis not present

## 2024-06-07 DIAGNOSIS — E782 Mixed hyperlipidemia: Secondary | ICD-10-CM | POA: Diagnosis not present

## 2024-06-07 DIAGNOSIS — G44329 Chronic post-traumatic headache, not intractable: Secondary | ICD-10-CM | POA: Diagnosis not present

## 2024-06-07 DIAGNOSIS — M5090 Cervical disc disorder, unspecified, unspecified cervical region: Secondary | ICD-10-CM | POA: Insufficient documentation

## 2024-06-07 DIAGNOSIS — N401 Enlarged prostate with lower urinary tract symptoms: Secondary | ICD-10-CM | POA: Insufficient documentation

## 2024-06-07 DIAGNOSIS — I10 Essential (primary) hypertension: Secondary | ICD-10-CM | POA: Diagnosis not present

## 2024-06-07 DIAGNOSIS — I2510 Atherosclerotic heart disease of native coronary artery without angina pectoris: Secondary | ICD-10-CM | POA: Diagnosis not present

## 2024-06-07 LAB — COMPREHENSIVE METABOLIC PANEL WITH GFR
ALT: 17 U/L (ref 0–53)
AST: 20 U/L (ref 0–37)
Albumin: 4.5 g/dL (ref 3.5–5.2)
Alkaline Phosphatase: 60 U/L (ref 39–117)
BUN: 22 mg/dL (ref 6–23)
CO2: 27 meq/L (ref 19–32)
Calcium: 9.6 mg/dL (ref 8.4–10.5)
Chloride: 104 meq/L (ref 96–112)
Creatinine, Ser: 1.03 mg/dL (ref 0.40–1.50)
GFR: 67.24 mL/min (ref >60.00)
Glucose, Bld: 99 mg/dL (ref 70–99)
Potassium: 3.9 meq/L (ref 3.5–5.1)
Sodium: 139 meq/L (ref 135–145)
Total Bilirubin: 0.6 mg/dL (ref 0.2–1.2)
Total Protein: 6.9 g/dL (ref 6.0–8.3)

## 2024-06-07 LAB — CBC WITH DIFFERENTIAL/PLATELET
Basophils Absolute: 0 K/uL (ref 0.0–0.1)
Basophils Relative: 0.6 % (ref 0.0–3.0)
Eosinophils Absolute: 0.1 K/uL (ref 0.0–0.7)
Eosinophils Relative: 1 % (ref 0.0–5.0)
HCT: 41.6 % (ref 39.0–52.0)
Hemoglobin: 13.9 g/dL (ref 13.0–17.0)
Lymphocytes Relative: 22.6 % (ref 12.0–46.0)
Lymphs Abs: 1.5 K/uL (ref 0.7–4.0)
MCHC: 33.3 g/dL (ref 30.0–36.0)
MCV: 84.4 fl (ref 78.0–100.0)
Monocytes Absolute: 0.8 K/uL (ref 0.1–1.0)
Monocytes Relative: 12.2 % — ABNORMAL HIGH (ref 3.0–12.0)
Neutro Abs: 4.1 K/uL (ref 1.4–7.7)
Neutrophils Relative %: 63.6 % (ref 43.0–77.0)
Platelets: 133 K/uL — ABNORMAL LOW (ref 150.0–400.0)
RBC: 4.93 Mil/uL (ref 4.22–5.81)
RDW: 12.8 % (ref 11.5–15.5)
WBC: 6.5 K/uL (ref 4.0–10.5)

## 2024-06-07 MED ORDER — CHLORTHALIDONE 25 MG PO TABS: 12.5000 mg | ORAL_TABLET | Freq: Every day | ORAL | 3 refills | Status: DC

## 2024-06-07 NOTE — Patient Instructions (Signed)
  VISIT SUMMARY: Today, you were seen for persistent hypertension, ongoing headaches, and neck pain. We discussed your current medications and recent health changes, including a vision change in your right eye and a history of chronic kidney disease. We also reviewed your recent fall and its impact on your headaches.  YOUR PLAN: -CERVICOGENIC HEADACHE: Your headaches are likely originating from your neck, possibly due to your fall in the spring. We will order an MRI of your brain and neck to check for any issues and refer you to a neurologist and an eye specialist for further evaluation. Physical therapy may also be considered after we get the imaging results.  -HYPERTENSION: Your blood pressure has been unstable. We will continue your current medications and closely monitor your blood pressure to avoid any low blood pressure episodes. Lifestyle changes like a lower salt diet and regular exercise are also recommended.  -CORONARY ARTERY DISEASE: You have coronary artery disease, which means the blood vessels supplying your heart are narrowed. We will continue your current medications, aspirin  and rosuvastatin , and monitor for any heart-related symptoms. Regular follow-ups with your heart doctor are important.  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 We will repeat your blood tests to check your kidney function before your MRI and continue to monitor your kidney health regularly.  -ABDOMINAL AORTIC ANEURYSM: You have an abdominal aortic aneurysm, which is a bulge in the main blood vessel that supplies blood to your abdomen. We will continue to manage this with blood pressure and cholesterol control.  -BENIGN PROSTATIC HYPERPLASIA: You have an enlarged prostate, which can cause urinary issues. We will continue your current medication, tamsulosin, to manage this condition.  INSTRUCTIONS: Please schedule follow-up  appointments with neurology and ophthalmology. Also, schedule follow-up for lab work and imaging results.

## 2024-06-07 NOTE — Progress Notes (Signed)
 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 Headaches are daily, located at the base of the neck, and radiate upwards. He uses acetaminophen  daily for relief. Previous imaging showed cervical spine arthritis. Differential includes cervicogenic headache versus other neurological causes. No neurological deficits on examination. Given hypertension and recent vision changes, further investigation is warranted to rule out vascular issues. - Order MRI of the brain and neck with and without contrast to evaluate for any mass or vascular issues. - Refer to neurology for further evaluation of headaches and potential vascular issues. - Refer to ophthalmology to evaluate for any retinal or vascular issues related to intermittent vision changes. - Consider physical therapy for cervicogenic headache management after imaging results.  Hypertension Persistent labile hypertension with current blood pressure at 140/80 mmHg. He is on chlorthalidone , hydralazine , and telmisartan . Recent episodes of hypotension due to medication stacking. Blood pressure management is crucial due to associated coronary artery disease and renal artery stenosis. Exercise is recommended to potentially improve blood pressure control, though it may not eliminate the need for medication. - Continue current antihypertensive regimen including chlorthalidone , hydralazine , and telmisartan . - Monitor blood pressure closely to avoid hypotensive episodes. - Encourage lifestyle modifications including a lower salt diet and regular exercise.  Coronary Artery Disease Coronary artery disease with risk factors including hypertension and hyperlipidemia. He is on aspirin  and rosuvastatin . Recent cardiology follow-up noted medication stacking leading to hypotension. Maintaining blood pressure and cholesterol  control is essential to prevent further vascular complications. - Continue aspirin  and rosuvastatin  for coronary artery disease management. - Monitor for any cardiac symptoms and ensure regular follow-up with cardiology.  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 Recent creatinine and GFR levels were stable. Renal artery stenosis noted, contributing to hypertension management challenges. Kidney function must be monitored to ensure safe administration of contrast for imaging. - Repeat metabolic panel to assess kidney function before MRI with contrast. - Continue monitoring kidney function regularly.  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 Managed with blood pressure and cholesterol control. Regular monitoring is necessary to prevent complications. - Continue current management with blood pressure and cholesterol control.  Benign Prostatic Hyperplasia Benign prostatic hyperplasia managed with tamsulosin. - Continue tamsulosin for BPH management.  Follow-up Follow-up plans for ongoing management and evaluation of current conditions. Imaging and specialist referrals are prioritized to address potential vascular and neurological issues. - Schedule follow-up appointments with neurology and ophthalmology. - Schedule follow-up for lab work and imaging results.      Medications Discontinued During This Encounter  Medication Reason   chlorthalidone  (HYGROTON ) 25 MG tablet     Return in about 3 months (around 09/07/2024) for blood pressure, headaches.        Subjective:   Encounter date: 06/07/2024  Ricardo [name] is a 83 y.o. male who has CAD (coronary artery disease), native coronary artery; Hyperlipidemia; Essential hypertension; Abdominal aneurysm (HCC); Personal history of malignant neoplasm of bladder; Bradycardia; Chest pain at rest; Abdominal aortic aneurysm (AAA) without rupture (HCC); Unstable angina (HCC);  Sinus bradycardia; Hypertensive emergency; Pain in finger of right hand; Nasal valve collapse; Muscle tension dysphonia; Deviated nasal septum; Acquired trigger finger of right ring finger; Resistant hypertension; Amaurosis fugax; Cervical disc disease; Chronic post-traumatic headache, not intractable; and Benign prostatic hyperplasia with lower urinary tract symptoms on their problem list..  He  has a past medical history of Abdominal aneurysm (HCC) (08/30/2018), Arthritis, Bladder cancer (HCC) (DX   OCT 2011--  FOLLOWED BY DR ELIZA), CAD (coronary artery disease), native coronary artery (08/30/2018), Coronary artery disease (CARDIOLOGIST- DR BLANCA-  LAST VISIT 2 WKS AGO-- WILL REQUEST NOTE AND EKG), Essential hypertension (08/30/2018), Hyperlipidemia, Hypertension, Nocturia, Personal history of malignant neoplasm of bladder (08/30/2018), and Post PTCA (2001-   X1 STENT TO RCA).SABRA   He presents with chief complaint of Follow-up (Three month follow-up) .   Discussed the use of AI scribe software for clinical note transcription with the patient, who gave verbal consent to proceed.  History of Present Illness Ricardo [name] is an 83 year old male with persistent hypertension and CKD3 who presents with ongoing headaches and neck pain.  He is following up for persistent hypertension, which has been labile. His current blood pressure is 140/80 mmHg. He takes chlorthalidone  2.5 mg, hydralazine  25 mg TID, Imdur  30 mg, and telmisartan  80 mg for blood pressure management. He also takes aspirin  81 mg and rosuvastatin  5 mg for coronary artery disease and hyperlipidemia. He has been hospitalized twice due to hypertension issues.  He has ongoing headaches and neck pain since a fall in the spring, where he slipped and rolled downhill, hitting his head in a crevice. The headaches start in the back of the neck and radiate upwards, occurring daily. He takes two extra strength Tylenol  daily for relief. He had an MRI  of the brain in 2022.  He has a history of CKD3, with the last metabolic panel three months ago showing a creatinine level of 642. He also has a history of bladder cancer and benign prostatic hyperplasia, for which he takes tamsulosin 0.4 mg.  He experienced a vision change in his right eye about three weeks ago, described as blurriness, but reports no current vision problems. No nausea or worsening of headaches. His wife reports that he had a bad headache lasting all day yesterday. He has been taking Tylenol  regularly for several months.     ROS  Past Surgical History:  Procedure Laterality Date   CORONARY ANGIOPLASTY  1996   CORONARY ANGIOPLASTY WITH STENT PLACEMENT  2001   STENT TO RCA   CYSTO/ BLADDER BX  10-22-10   CYSTOSCOPY W/ RETROGRADES  11/12/2011   Procedure: CYSTOSCOPY WITH RETROGRADE PYELOGRAM;  Surgeon: Toribio Neysa ELIZA, MD;  Location[facility];  Service: Urology;  Laterality: Bilateral;   LEFT HEART CATH AND CORONARY ANGIOGRAPHY N/A 07/09/2020   Procedure: LEFT HEART CATH AND CORONARY ANGIOGRAPHY;  Surgeon: Claudene Pacific, MD;  Location: [facility];  Service: Cardiovascular;  Laterality: N/A;   LUMBAR FUSION     TRANSURETHRAL RESECTION OF BLADDER TUMOR  08-27-10   RIGHT ANTERIOR DOME   TRANSURETHRAL RESECTION OF BLADDER TUMOR  11/12/2011   Procedure: TRANSURETHRAL RESECTION OF BLADDER TUMOR (TURBT);  Surgeon: Toribio Neysa ELIZA, MD;  Location: [facility];  Service: Urology;  Laterality: N/A;  with PK Gyrus c-arm gyrus  digital ureteroscope    Outpatient Medications Prior to Visit  Medication Sig Dispense Refill   acetaminophen  (TYLENOL ) 500 MG tablet Take 1,000 mg by mouth daily as needed for headache (pain).     aspirin  81 MG tablet Take 1 tablet (81 mg total) by mouth daily. 1 tablet 0   Evolocumab  (REPATHA  SURECLICK) 140 MG/ML SOAJ Inject 140 mg into the skin See admin instructions. Inject 140 mg subcutaneously on  the 1st  and 15th of each month 2 mL 11   hydrALAZINE  (APRESOLINE ) 25 MG tablet Take 1 25 mg tablet as need for blood pressure greater than 160 systolic, Can take up to 3 doses if needed 30 tablet 6   isosorbide  mononitrate (IMDUR ) 30 MG 24 hr tablet Take 1 tablet (30 mg total) by mouth daily. 90 tablet 3   nitroGLYCERIN  (NITROSTAT ) 0.4 MG SL tablet Place 1 tablet (0.4 mg total) under the tongue every 5 (five) minutes as needed. 25 tablet 3   rosuvastatin  (CRESTOR ) 5 MG tablet Take 1 tablet (5 mg total) by mouth daily. 90 tablet 1   tamsulosin (FLOMAX) 0.4 MG CAPS capsule Take 0.4 mg by mouth daily after supper.     telmisartan  (MICARDIS ) 80 MG tablet Take 1 tablet (80 mg total) by mouth daily. 90 tablet 3   chlorthalidone  (HYGROTON ) 25 MG tablet Take 0.5 tablets (12.5 mg total) by mouth daily. 45 tablet 3   No facility-administered medications prior to visit.    Family History  Problem Relation Age of Onset   Diabetes Mother    Hypertension Mother     Social History   Socioeconomic History   Marital status: Married    Spouse name: Not on file   Number of children: 4   Years of education: Not on file   Highest education level: Not on file  Occupational History   Occupation: retired - Advice worker  Tobacco Use   Smoking status: Former    Current packs/day: 0.00    Average packs/day: 1.5 packs/day for 5.0 years (7.5 ttl pk-yrs)    Types: Cigarettes    Start date: 11/05/1960    Quit date: 11/05/1965    Years since quitting: 58.6   Smokeless tobacco: Never  Vaping Use   Vaping status: Never Used  Substance and Sexual Activity   Alcohol use: No   Drug use: No   Sexual activity: Not on file  Other Topics Concern   Not on file  Social History Narrative   Not on file   Social Drivers of Health   Financial Resource Strain: Not on file  Food Insecurity: Not on file  Transportation Needs: Not on file  Physical Activity: Not on file  Stress: Not on file  Social Connections:  Not on file  Intimate Partner Violence: Not on file                                                                                                  Objective:  Physical Exam: BP (!) 140/80 (BP Location: Left Arm, Patient Position: Sitting, Cuff Size: Normal)   Pulse 94   Temp 97.7 F (36.5 C)   Ht 5' 6 (1.676 m)   Wt 179 lb (81.2 kg)   SpO2 95%   BMI 28.89 kg/m    Physical Exam VITALS: BP- 140/80 GENERAL: Alert, cooperative, well developed, no acute distress. HEENT: Normocephalic, normal oropharynx, moist mucous membranes. CHEST: Clear to auscultation bilaterally, no wheezes, rhonchi, or crackles. CARDIOVASCULAR: Normal heart rate and rhythm, S1 and S2 normal without murmurs. ABDOMEN: Soft, non-tender, non-distended, without  organomegaly, normal bowel sounds. EXTREMITIES: No cyanosis or edema. NEUROLOGICAL: Cranial nerves grossly intact, moves all extremities without gross motor or sensory deficit.   Physical Exam  VAS US  RENAL ARTERY DUPLEX Result Date: 05/06/2024 ABDOMINAL VISCERAL Patient Name:  NEHEMIAH MCFARREN  Date of Exam:   05/02/2024 Medical Rec #: [id number]       Accession #:    [id number] Date of Birth: [birth year]       Patient Gender: M Patient Age:   83 years Exam Location:  [address] Procedure:      VAS US  RENAL ARTERY DUPLEX Referring Phys: [id number] [place] THOMAS O'NEAL -------------------------------------------------------------------------------- Indications: Hypertension; ongoing fluctuations in blood pressure with both high              and low readings. High Risk Factors: Hypertension, hyperlipidemia, past history of smoking,                    coronary artery disease. Other Factors: Known AAA 3.6 cm. Limitations: Air/bowel gas. Performing Technologist: Edsel Mustard RVT  Examination Guidelines: A complete evaluation includes B-mode imaging, spectral Doppler, color Doppler, and power Doppler as needed of all accessible portions of each vessel. Bilateral  testing is considered an integral part of a complete examination. Limited examinations for reoccurring indications may be performed as noted.  Duplex Findings: +--------------------+--------+--------+------+----------------+ Mesenteric          PSV cm/sEDV cm/sPlaque    Comments     +--------------------+--------+--------+------+----------------+ Aorta Prox             94                                  +--------------------+--------+--------+------+----------------+ Aorta Distal           50                 3.6 x 3.7 cm trv +--------------------+--------+--------+------+----------------+ Celiac Artery Origin  196                                  +--------------------+--------+--------+------+----------------+ SMA Proximal          135      9                           +--------------------+--------+--------+------+----------------+   +------------------+--------+--------+-------+ Right Renal ArteryPSV cm/sEDV cm/sComment +------------------+--------+--------+-------+ Origin              164      48           +------------------+--------+--------+-------+ Proximal            194      71           +------------------+--------+--------+-------+ Mid                 109      20           +------------------+--------+--------+-------+ Distal              172      34           +------------------+--------+--------+-------+ +-----------------+--------+--------+-------+ Left Renal ArteryPSV cm/sEDV cm/sComment +-----------------+--------+--------+-------+ Origin             125      23           +-----------------+--------+--------+-------+ Proximal  151      20           +-----------------+--------+--------+-------+ Mid                157      26           +-----------------+--------+--------+-------+ Distal              83      18           +-----------------+--------+--------+-------+  Technologist observations: IVC prox and  bilateral renal veins appear patent +------------+--------+--------+----+-----------+--------+--------+----+ Right KidneyPSV cm/sEDV cm/sRI  Left KidneyPSV cm/sEDV cm/sRI   +------------+--------+--------+----+-----------+--------+--------+----+ Upper Pole  47      10      0.78Upper Pole 32      11      0.65 +------------+--------+--------+----+-----------+--------+--------+----+ Mid         42      11      0.        30      10      0.67 +------------+--------+--------+----+-----------+--------+--------+----+ Lower Pole  31      10      0.69Lower Pole 42      11      0.74 +------------+--------+--------+----+-----------+--------+--------+----+ Hilar                           Hilar      84      19      0.77 +------------+--------+--------+----+-----------+--------+--------+----+ +------------------+--------+------------------+-------+ Right Kidney              Left Kidney               +------------------+--------+------------------+-------+ RAR                       RAR                       +------------------+--------+------------------+-------+ RAR (manual)      2.1     RAR (manual)      1.7     +------------------+--------+------------------+-------+ Cortex                    Cortex                    +------------------+--------+------------------+-------+ Cortex thickness  10.00 mmCorex thickness   8.00 mm +------------------+--------+------------------+-------+ Kidney length (cm)10.60   Kidney length (cm)10.60   +------------------+--------+------------------+-------+  Summary: Renal:  Right: 1-59% stenosis of the right renal artery. RRV flow present.        Normal size right kidney. Normal right Resisitive Index.        Normal cortical thickness of right kidney. Left:  1-59% stenosis of the left renal artery. Normal size of left        kidney. Normal left Resistive Index. Normal cortical        thickness of the left kidney. LRV  flow present. Mesenteric: Normal Celiac artery and Superior Mesenteric artery findings.  *See table(s) above for measurements and observations.  Diagnosing physician: Timothy Gollan MD  Electronically signed by Timothy Gollan MD on 05/06/2024 at 5:29:09 PM.    Final     No results found for this or any previous visit (from the past 2160 hours).      Beverley Adine Hummer, MD, MS

## 2024-06-08 ENCOUNTER — Telehealth: Payer: Self-pay | Admitting: Family Medicine

## 2024-06-08 LAB — SEDIMENTATION RATE: Sed Rate: 8 mm/h (ref 0–20)

## 2024-06-08 NOTE — Telephone Encounter (Signed)
 Copied from CRM [id number]. Topic: General - Other >> Jun 08, 2024 10:28 AM Aleatha C wrote: Reason for CRM: Wife  Rose wanted to know if Husband having a metal rod in his spine  would be an issue when he is schedule for a MRI for his cervical  please call Rose to confirm if  they should or shouldn't do MRI at [phone]

## 2024-06-08 NOTE — Telephone Encounter (Signed)
 Left VM to return call about upcoming MRI. Patient had question about Metal Rod in patient's spine. Spoke with Imaging and was told MRI can still be done.

## 2024-06-08 NOTE — Telephone Encounter (Signed)
 Patient wife return call and would like a call back pertaining to her initial question.  CB#[phone]

## 2024-06-08 NOTE — Addendum Note (Signed)
 Addended by: ALTO PARODY D on: 06/08/2024 04:03 PM   Modules accepted: Orders

## 2024-06-08 NOTE — Telephone Encounter (Unsigned)
 Copied from CRM #[id number]. Topic: General - Other >> Jun 08, 2024 12:10 PM Gennette ORN wrote: Reason for CRM: Patient wanted to know if having a metal rod in his spine would be an issue due to him, being schedule for a MRI for his cervical They called and left voicemail stating that it is okay.

## 2024-06-09 ENCOUNTER — Ambulatory Visit (HOSPITAL_BASED_OUTPATIENT_CLINIC_OR_DEPARTMENT_OTHER): Admitting: Family

## 2024-06-09 ENCOUNTER — Ambulatory Visit: Payer: Self-pay | Admitting: Family Medicine

## 2024-06-09 VITALS — BP 160/70 | HR 78 | Ht 66.0 in | Wt 178.0 lb

## 2024-06-09 DIAGNOSIS — I10 Essential (primary) hypertension: Secondary | ICD-10-CM | POA: Diagnosis not present

## 2024-06-09 DIAGNOSIS — I6522 Occlusion and stenosis of left carotid artery: Secondary | ICD-10-CM

## 2024-06-09 MED ORDER — ISOSORBIDE MONONITRATE ER 30 MG PO TB24: ORAL_TABLET | ORAL | Status: DC

## 2024-06-09 NOTE — Patient Instructions (Addendum)
 Medication Instructions:   CHANGE Isosorbide  Mononitrate (Imdur ) to 30mg  (one tablet) in the morning and 15mg  (half tablet) in the evening  Continue Chlorthalidone  12.5mg  (half tablet) in the morning  Continue Telmisartan  one 80mg  tablet in the evening   Labwork: Your physician recommends that you return for lab work today: TSH, catecholamines, metanephrines, renin-aldosterone   Testing/Procedures: Your physician has requested that you have a carotid duplex 09/2024. This test is an ultrasound of the carotid arteries in your neck. It looks at blood flow through these arteries that supply the brain with blood. Allow one hour for this exam. There are no restrictions or special instructions.    Follow-Up: Please follow up in 2  months in [facility] with Dr. Raford, Reche Finder, NP or Allean Mink PharmD    Special Instructions:    Reche GORMAN Finder, NP will send MyChart message in 2 weeks to check in on blood pressure

## 2024-06-09 NOTE — Progress Notes (Signed)
 [facility] Initial Assessment:    Date:  06/09/2024   ID:  Ricardo [name], DOB December 30, 1940, MRN [id number]  PCP:  Sebastian Beverley NOVAK, MD  Cardiologist:  Darryle ONEIDA Decent, MD  Nephrologist:  Referring MD: Sebastian Beverley NOVAK, MD   CC: Hypertension  History of Present Illness:     Discussed the use of AI scribe software for clinical note transcription with the patient, who gave verbal consent to proceed.  History of Present Illness Ricardo [name] is an 83 year old male with CAD, HLD, HTN, AAA, orthostatic hypotension, carotid artery disease, hypertension who presents for evaluation of uncontrolled blood pressure.  Prior RCA stent 2001. Admitted in 06/2020 for unstable angina. Cath 07/09/20 showed mild-moderate multivessel disease managed medically. Admitted in 04/2021 with chest pain, hypertensive emergency and bradycardia. Echo EF 60-65%, no RWMA, mild LVH, grade I DD, normal RV systolic function, no significant valvular abnormalities. Monitor in 07/2021 showed no significant arrhythmias. Nuclear stress test 07/2021 no ischemia or infarction.    History of AAA. Ultrasound from 03/2022 showed evidence of abnormal dilatation of the distal abdominal aorta, largest measurement 3.6 cm. Carotid dopplers 09/2023 revealed 1-39% stenosis in the right ICA, near-normal Left ICA.    Prior issues with dizziness and hypotension, clonidine  weaned. Hydralazine  added PRN. Renal ultrasounds 04/2024 that showed 1-59% stenosis in bilateral renal arteries    Here to establish with [facility] with his son. He has been managing hypertension since his mid-fifties, requiring multiple medications. His current regimen includes chlorthalidone , hydralazine  as needed, isosorbide , and telmisartan . Despite this, he frequently requires hydralazine  for elevated blood pressure.  He experiences occasional lightheadedness, which improves upon sitting.   He monitors his blood pressure at home,  noting significant fluctuations. He occasionally experiences headaches when his blood pressure is high.  His physical activity is limited due to knee pain, and he uses a pedal bike for 10-15 minutes without a regular exercise routine.  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 Claudene   Coronary artery disease CARDIOLOGIST- DR BLANCA-  LAST VISIT 2 WKS AGO-- WILL REQUEST NOTE AND EKG   STRESS TEST -- JULY 2009   Essential hypertension 08/30/2018   Hyperlipidemia    Hypertension    Nocturia    Personal history of malignant neoplasm of bladder 08/30/2018   Post PTCA 2001-   X1 STENT TO RCA   AND 1996    Past Surgical History:  Procedure Laterality Date   CORONARY ANGIOPLASTY  1996   CORONARY ANGIOPLASTY WITH STENT PLACEMENT  2001   STENT TO RCA   CYSTO/ BLADDER BX  10-22-10   CYSTOSCOPY W/ RETROGRADES  11/12/2011   Procedure: CYSTOSCOPY WITH RETROGRADE PYELOGRAM;  Surgeon: Toribio Neysa Repine, MD;  Location: [facility];  Service: Urology;  Laterality: Bilateral;   LEFT HEART CATH AND CORONARY ANGIOGRAPHY N/A 07/09/2020   Procedure: LEFT HEART CATH AND CORONARY ANGIOGRAPHY;  Surgeon: Claudene Pacific, MD;  [facility]: [facility];  Service: Cardiovascular;  Laterality: N/A;  LUMBAR FUSION     TRANSURETHRAL RESECTION OF BLADDER TUMOR  08-27-10   RIGHT ANTERIOR DOME   TRANSURETHRAL RESECTION OF BLADDER TUMOR  11/12/2011   Procedure: TRANSURETHRAL RESECTION OF BLADDER TUMOR (TURBT);  Surgeon: Toribio Neysa Repine, MD;  Location: [facility];   Service: Urology;  Laterality: N/A;  with PK Gyrus c-arm gyrus  digital ureteroscope    Current Medications: Current Meds  Medication Sig   acetaminophen  (TYLENOL ) 500 MG tablet Take 1,000 mg by mouth daily as needed for headache (pain).   aspirin  81 MG tablet Take 1 tablet (81 mg total) by mouth daily.   chlorthalidone  (HYGROTON ) 25 MG tablet Take 0.5 tablets (12.5 mg total) by mouth daily.   Evolocumab  (REPATHA  SURECLICK) 140 MG/ML SOAJ Inject 140 mg into the skin See admin instructions. Inject 140 mg subcutaneously on the 1st and 15th of each month   hydrALAZINE  (APRESOLINE ) 25 MG tablet Take 1 25 mg tablet as need for blood pressure greater than 160 systolic, Can take up to 3 doses if needed   isosorbide  mononitrate (IMDUR ) 30 MG 24 hr tablet Take 1 tablet (30 mg total) by mouth daily.   nitroGLYCERIN  (NITROSTAT ) 0.4 MG SL tablet Place 1 tablet (0.4 mg total) under the tongue every 5 (five) minutes as needed.   rosuvastatin  (CRESTOR ) 5 MG tablet Take 1 tablet (5 mg total) by mouth daily.   tamsulosin (FLOMAX) 0.4 MG CAPS capsule Take 0.4 mg by mouth daily after supper.   telmisartan  (MICARDIS ) 80 MG tablet Take 1 tablet (80 mg total) by mouth daily.     Allergies:   Clonidine  hcl, Cyclobenzaprine, and Norvasc [amlodipine besylate]   Social History   Socioeconomic History   Marital status: Married    Spouse name: Not on file   Number of children: 4   Years of education: Not on file   Highest education level: Not on file  Occupational History   Occupation: retired - Advice worker  Tobacco Use   Smoking status: Former    Current packs/day: 0.00    Average packs/day: 1.5 packs/day for 5.0 years (7.5 ttl pk-yrs)    Types: Cigarettes    Start date: 11/05/1960    Quit date: 11/05/1965    Years since quitting: 58.6   Smokeless tobacco: Never  Vaping Use   Vaping status: Never Used  Substance and Sexual Activity   Alcohol use: No   Drug use: No   Sexual activity: Not on file   Other Topics Concern   Not on file  Social History Narrative   Not on file   Social Drivers of Health   Financial Resource Strain: Not on file  Food Insecurity: Not on file  Transportation Needs: Not on file  Physical Activity: Not on file  Stress: Not on file  Social Connections: Not on file     Family History: The patient's family history includes Diabetes in his mother; Hypertension in his mother.  ROS:   Please see the history of present illness.     All other systems reviewed and are negative.  EKGs/Labs/Other Studies Reviewed:         Recent Labs: 09/28/2023: Magnesium 2.3 06/07/2024: ALT 17; BUN 22; Creatinine, Ser 1.03; Hemoglobin 13.9; Platelets 133.0; Potassium 3.9; Sodium 139   Recent Lipid Panel    Component Value Date/Time   CHOL 100 02/18/2021 1011   TRIG 78 02/18/2021 1011   HDL 50 02/18/2021 1011   CHOLHDL 2.0 02/18/2021 1011   CHOLHDL 4.2  07/09/2020 0757   VLDL 15 07/09/2020 0757   LDLCALC 34 02/18/2021 1011    Physical Exam:   VS:  BP (!) 160/70 (BP Location: Left Arm, Patient Position: Sitting, Cuff Size: Normal)   Pulse 78   Ht 5' 6 (1.676 m)   Wt 178 lb (80.7 kg)   BMI 28.73 kg/m  , BMI Body mass index is 28.73 kg/m. GENERAL:  Well appearing HEENT: Pupils equal round and reactive, fundi not visualized, oral mucosa unremarkable NECK:  No jugular venous distention, waveform within normal limits, carotid upstroke brisk and symmetric, no bruits, no thyromegaly LYMPHATICS:  No cervical adenopathy LUNGS:  Clear to auscultation bilaterally HEART:  RRR.  PMI not displaced or sustained,S1 and S2 within normal limits, no S3, no S4, no clicks, no rubs, no murmurs ABD:  Flat, positive bowel sounds normal in frequency in pitch, no bruits, no rebound, no guarding, no midline pulsatile mass, no hepatomegaly, no splenomegaly EXT:  2 plus pulses throughout, no edema, no cyanosis no clubbing SKIN:  No rashes no nodules NEURO:  Cranial nerves II through  XII grossly intact, motor grossly intact throughout PSYCH:  Cognitively intact, oriented to person place and time   ASSESSMENT/PLAN:     Assessment & Plan Hypertension Hypertension poorly controlled with current regimen. Blood pressure fluctuates significantly. Possible secondary causes include pheochromocytoma and hyperaldosteronism. - Order blood work for secondary hypertension causes (TSH, catecholamines, metanephrines, renin-aldoeteron) -no symptoms of OSA - Increase isosorbide  to 45 mg by adding 15 mg at night and continuing 30mg  AM.  - Encourage fluid intake to 64 ounces daily. - Continue chlorthalidone , hydralazine  as needed, telmisartan  at present doses. - Monitor blood pressure at home and report readings. - Send MyChart message in two weeks to assess control. -Refer to PREP exercise program  Lightheadedness / Carotid stenosis Intermittent lightheadedness possibly due to blood pressure fluctuations. No significant carotid stenosis. - Monitor blood pressure, adjust medications as needed. -Anticipate repeat carotid duplex 09/2024.  Low platelet count Chronic low platelet count, slightly improved. No acute concerns. - Monitor platelet count in routine blood work.     Screening for Secondary Hypertension:     Relevant Labs/Studies:    Latest Ref Rng & Units 06/07/2024    2:10 PM 02/08/2024   11:42 AM 02/03/2024    4:21 PM  Basic Labs  Sodium 135 - 145 mEq/L 139  143  142   Potassium 3.5 - 5.1 mEq/L 3.9  4.4  4.4   Creatinine 0.40 - 1.50 mg/dL 8.96  8.81  8.14        Latest Ref Rng & Units 10/25/2018   11:34 AM  Thyroid    TSH 0.450 - 4.500 uIU/mL 1.750                 05/02/2024   11:15 AM  Renovascular   Renal Artery US  Completed Yes     he is interested in enrolling in the PREP exercise and nutrition program through the [facility].     Disposition:    FU with MD/APP/PharmD in 2 months    Medication Adjustments/Labs and Tests Ordered: Current medicines  are reviewed at length with the patient today.  Concerns regarding medicines are outlined above.  No orders of the defined types were placed in this encounter.  No orders of the defined types were placed in this encounter.    Signed, Reche GORMAN Finder, NP  06/09/2024 2:41 PM    [facility]

## 2024-06-09 NOTE — Telephone Encounter (Signed)
 Spoke with patient informing him of recent note that imaging center stated no problem still getting MRI with metal rod in spine. Pt acknowledged understanding with no additional questions or concerns.

## 2024-06-12 ENCOUNTER — Ambulatory Visit (HOSPITAL_BASED_OUTPATIENT_CLINIC_OR_DEPARTMENT_OTHER)
Admission: RE | Admit: 2024-06-12 | Discharge: 2024-06-12 | Disposition: A | Discharge status: Home / Self Care | Source: Ambulatory Visit | Attending: Family Medicine | Admitting: Family Medicine

## 2024-06-12 ENCOUNTER — Ambulatory Visit (HOSPITAL_BASED_OUTPATIENT_CLINIC_OR_DEPARTMENT_OTHER)
Admission: RE | Admit: 2024-06-12 | Discharge: 2024-06-12 | Disposition: A | Discharge status: Home / Self Care | Source: Ambulatory Visit | Attending: Family Medicine

## 2024-06-12 DIAGNOSIS — G8929 Other chronic pain: Secondary | ICD-10-CM | POA: Diagnosis not present

## 2024-06-12 DIAGNOSIS — Z8551 Personal history of malignant neoplasm of bladder: Secondary | ICD-10-CM | POA: Diagnosis not present

## 2024-06-12 DIAGNOSIS — M5090 Cervical disc disorder, unspecified, unspecified cervical region: Secondary | ICD-10-CM | POA: Insufficient documentation

## 2024-06-12 DIAGNOSIS — M4802 Spinal stenosis, cervical region: Secondary | ICD-10-CM | POA: Diagnosis not present

## 2024-06-12 DIAGNOSIS — M47812 Spondylosis without myelopathy or radiculopathy, cervical region: Secondary | ICD-10-CM | POA: Diagnosis not present

## 2024-06-12 DIAGNOSIS — G453 Amaurosis fugax: Secondary | ICD-10-CM | POA: Insufficient documentation

## 2024-06-12 DIAGNOSIS — S0990XA Unspecified injury of head, initial encounter: Secondary | ICD-10-CM | POA: Diagnosis not present

## 2024-06-12 DIAGNOSIS — M542 Cervicalgia: Secondary | ICD-10-CM | POA: Diagnosis not present

## 2024-06-12 MED ORDER — GADOBUTROL 1 MMOL/ML IV SOLN
7.5000 mL | Freq: Once | INTRAVENOUS | Status: AC | PRN
  Administered 2024-06-12: 7.5 mL via INTRAVENOUS

## 2024-06-13 ENCOUNTER — Telehealth: Payer: Self-pay

## 2024-06-13 NOTE — Telephone Encounter (Signed)
 Called to discuss PREP program; he would like to attend, but having some issues with his knee; will contact him next month about possibly starting August 18 class at [facility].

## 2024-06-13 NOTE — Progress Notes (Unsigned)
 Patient name: Ricardo [name] MRN: [id number] DOB: [birth year] Sex: male  REASON FOR CONSULT: 1 year f/u AAA surveillance   HPI: Ricardo [name] is a 83 y.o. male, with history of coronary artery disease, bladder cancer, hypertension, hyperlipidemia, CKD stage 2 that presents for 1 year follow-up of his AAA.  Last seen on 05/05/23 with a 4.2 cm AAA.  States this has been followed for years by his PCP Dr. Gib who recently retired.    No new complaints today.  Denies any abdominal or back pain.  States he does not get any leg pain with walking other than some knee discomfort.    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 Claudene   Coronary artery disease CARDIOLOGIST- DR BLANCA-  LAST VISIT 2 WKS AGO-- WILL REQUEST NOTE AND EKG   STRESS TEST -- JULY 2009   Essential hypertension 08/30/2018   Hyperlipidemia    Hypertension    Nocturia    Personal history of malignant neoplasm of bladder 08/30/2018   Post PTCA 2001-   X1 STENT TO RCA   AND 1996    Past Surgical History:  Procedure Laterality Date   CORONARY ANGIOPLASTY  1996   CORONARY ANGIOPLASTY WITH STENT PLACEMENT  2001   STENT TO RCA   CYSTO/ BLADDER BX  10-22-10   CYSTOSCOPY W/ RETROGRADES  11/12/2011   Procedure: CYSTOSCOPY WITH RETROGRADE PYELOGRAM;  Surgeon: Toribio Neysa Repine, MD;  Location: [facility];  Service: Urology;  Laterality: Bilateral;   LEFT HEART CATH AND CORONARY ANGIOGRAPHY N/A 07/09/2020   Procedure: LEFT HEART CATH AND CORONARY ANGIOGRAPHY;  Surgeon: Claudene Pacific, MD;  Location: [facility];  Service: Cardiovascular;   Laterality: N/A;   LUMBAR FUSION     TRANSURETHRAL RESECTION OF BLADDER TUMOR  08-27-10   RIGHT ANTERIOR DOME   TRANSURETHRAL RESECTION OF BLADDER TUMOR  11/12/2011   Procedure: TRANSURETHRAL RESECTION OF BLADDER TUMOR (TURBT);  Surgeon: Toribio Neysa Repine, MD;  Location: [facility];  Service: Urology;  Laterality: N/A;  with PK Gyrus c-arm gyrus  digital ureteroscope    Family History  Problem Relation Age of Onset   Diabetes Mother    Hypertension Mother     SOCIAL HISTORY: Social History   Socioeconomic History   Marital status: Married    Spouse name: Not on file   Number of children: 4   Years of education: Not on file   Highest education level: Not on file  Occupational History   Occupation: retired - Advice worker  Tobacco Use   Smoking status: Former    Current packs/day: 0.00    Average packs/day: 1.5 packs/day for 5.0 years (7.5 ttl pk-yrs)    Types: Cigarettes    Start date: 11/05/1960    Quit date: 11/05/1965    Years since quitting: 58.6   Smokeless tobacco: Never  Vaping Use   Vaping status: Never Used  Substance and Sexual Activity   Alcohol use: No   Drug use: No   Sexual activity: Not on file  Other Topics Concern   Not on file  Social History Narrative   Not on file   Social Drivers of Health   Financial Resource Strain: Not on file  Food Insecurity: Not on file  Transportation Needs: Not on file  Physical Activity: Not on file  Stress: Not on file  Social Connections: Not on file  Intimate Partner Violence: Not on file    Allergies  Allergen Reactions   Clonidine  Hcl Other (See Comments)    Reaction to 0.2 mg tabs - caused dry mouth, drowsiness, lightheadedness - reported 07/08/2020 (no reaction to patch)   Cyclobenzaprine Other (See Comments)    Numbness in lips   Norvasc [Amlodipine Besylate] Other (See Comments)    CAUSES TEETH TO FALL OUT    Current Outpatient Medications  Medication Sig Dispense Refill    acetaminophen  (TYLENOL ) 500 MG tablet Take 1,000 mg by mouth daily as needed for headache (pain).     aspirin  81 MG tablet Take 1 tablet (81 mg total) by mouth daily. 1 tablet 0   chlorthalidone  (HYGROTON ) 25 MG tablet Take 0.5 tablets (12.5 mg total) by mouth daily. 45 tablet 3   Evolocumab  (REPATHA  SURECLICK) 140 MG/ML SOAJ Inject 140 mg into the skin See admin instructions. Inject 140 mg subcutaneously on the 1st and 15th of each month 2 mL 11   hydrALAZINE  (APRESOLINE ) 25 MG tablet Take 1 25 mg tablet as need for blood pressure greater than 160 systolic, Can take up to 3 doses if needed 30 tablet 6   isosorbide  mononitrate (IMDUR ) 30 MG 24 hr tablet Take 30 mg (one tablet) in the morning and 15 mg (half tablet) in the evening     nitroGLYCERIN  (NITROSTAT ) 0.4 MG SL tablet Place 1 tablet (0.4 mg total) under the tongue every 5 (five) minutes as needed. 25 tablet 3   rosuvastatin  (CRESTOR ) 5 MG tablet Take 1 tablet (5 mg total) by mouth daily. 90 tablet 1   tamsulosin (FLOMAX) 0.4 MG CAPS capsule Take 0.4 mg by mouth daily after supper.     telmisartan  (MICARDIS ) 80 MG tablet Take 1 tablet (80 mg total) by mouth daily. 90 tablet 3   No current facility-administered medications for this visit.    REVIEW OF SYSTEMS:  [X]  denotes positive finding, [ ]  denotes negative finding Cardiac  Comments:  Chest pain or chest pressure:    Shortness of breath upon exertion:    Short of breath when lying flat:    Irregular heart rhythm:        Vascular    Pain in calf, thigh, or hip brought on by ambulation:    Pain in feet at night that wakes you up from your sleep:     Blood clot in your veins:    Leg swelling:         Pulmonary    Oxygen  at home:    Productive cough:     Wheezing:         Neurologic    Sudden weakness in arms or legs:     Sudden numbness in arms or legs:     Sudden onset of difficulty speaking or slurred speech:    Temporary loss of vision in one eye:     Problems with  dizziness:         Gastrointestinal    Blood in stool:     Vomited blood:         Genitourinary    Burning when urinating:  Blood in urine:        Psychiatric    Major depression:         Hematologic    Bleeding problems:    Problems with blood clotting too easily:        Skin    Rashes or ulcers:        Constitutional    Fever or chills:      PHYSICAL EXAM: There were no vitals filed for this visit.  GENERAL: The patient is a well-nourished male, in no acute distress. The vital signs are documented above. CARDIAC: There is a regular rate and rhythm.  VASCULAR:  Bilateral femoral pulses palpable Bilateral DP pulses palpable PULMONARY: No respiratory distress ABDOMEN: Soft and non-tender.  No pain with palpation of aneurysm MUSCULOSKELETAL: There are no major deformities or cyanosis. NEUROLOGIC: No focal weakness or paresthesias are detected. SKIN: There are no ulcers or rashes noted. PSYCHIATRIC: The patient has a normal affect.  DATA:   AAA duplex today shows maximal aortic diameter 3.8 cm  Assessment/Plan:  83 y.o. male, with history of coronary artery disease, bladder cancer, hypertension, hyperlipidemia, CKD stage 2 that presents for 1 year follow-up of his AAA.  Last seen on 05/05/23 with a 4.2 cm AAA.  Duplex today shows aneurysm measures 3.8 cm.  I discussed that does not mean his aneurysm is getting smaller as there is some standard error of the test but ultimately if shows no significant growth in the aneurysm.  I think this is safe to continue observation.  I discussed that in men we repair these at greater than 5.5 cm.  I will see him in 1 year with repeat AAA duplex   Lonni DOROTHA Gaskins, MD [facility]: [phone]

## 2024-06-14 ENCOUNTER — Ambulatory Visit (HOSPITAL_BASED_OUTPATIENT_CLINIC_OR_DEPARTMENT_OTHER): Payer: Self-pay | Admitting: Family

## 2024-06-14 ENCOUNTER — Ambulatory Visit: Admitting: Vascular Surgery

## 2024-06-14 ENCOUNTER — Encounter: Payer: Self-pay | Admitting: Vascular Surgery

## 2024-06-14 ENCOUNTER — Ambulatory Visit (HOSPITAL_COMMUNITY)
Admission: RE | Admit: 2024-06-14 | Discharge: 2024-06-14 | Disposition: A | Discharge status: Home / Self Care | Source: Ambulatory Visit | Attending: Vascular Surgery | Admitting: Vascular Surgery

## 2024-06-14 VITALS — BP 205/79 | HR 60 | Temp 98.1°F | Resp 18 | Ht 66.0 in | Wt 180.1 lb

## 2024-06-14 DIAGNOSIS — I6523 Occlusion and stenosis of bilateral carotid arteries: Secondary | ICD-10-CM

## 2024-06-14 DIAGNOSIS — I7143 Infrarenal abdominal aortic aneurysm, without rupture: Secondary | ICD-10-CM | POA: Insufficient documentation

## 2024-06-14 DIAGNOSIS — I10 Essential (primary) hypertension: Secondary | ICD-10-CM

## 2024-06-14 LAB — METANEPHRINES, PLASMA
Metanephrine, Free: <25 pg/mL (ref 0.0–88.0)
Normetanephrine, Free: 80.1 pg/mL (ref 0.0–297.2)

## 2024-06-14 LAB — CATECHOLAMINES, FRACTIONATED, PLASMA
Dopamine: 18.2 pg/mL (ref 0.0–36.7)
Epinephrine: 26.3 pg/mL (ref 0.0–55.4)
Norepinephrine: 838 pg/mL — ABNORMAL HIGH (ref 115–524)

## 2024-06-14 LAB — ALDOSTERONE + RENIN ACTIVITY W/ RATIO
Aldos/Renin Ratio: 3.9 (ref 0.0–30.0)
Aldosterone: 3.4 ng/dL (ref 0.0–30.0)
Renin Activity, Plasma: 0.876 ng/mL/h (ref 0.167–5.380)

## 2024-06-14 LAB — TSH: TSH: 1.79 u[IU]/mL (ref 0.450–4.500)

## 2024-06-15 ENCOUNTER — Encounter (HOSPITAL_BASED_OUTPATIENT_CLINIC_OR_DEPARTMENT_OTHER): Payer: Self-pay

## 2024-06-15 ENCOUNTER — Encounter (HOSPITAL_BASED_OUTPATIENT_CLINIC_OR_DEPARTMENT_OTHER): Payer: Self-pay | Admitting: Family

## 2024-06-16 NOTE — Progress Notes (Signed)
 Called patient to go over results; left VM for callback and also sent MyChart message.

## 2024-06-17 NOTE — Progress Notes (Signed)
 2nd attempt to contact patient; left VM for call back.

## 2024-06-20 ENCOUNTER — Telehealth: Payer: Self-pay

## 2024-06-20 NOTE — Progress Notes (Signed)
 Results were given to patient and he verbalized understanding.

## 2024-06-20 NOTE — Telephone Encounter (Signed)
 Noted. MRI results were given to patient and he verbalized understanding.

## 2024-06-20 NOTE — Telephone Encounter (Signed)
 Copied from CRM [id number]. Topic: Clinical - Lab/Test Results >> Jun 17, 2024  4:25 PM Rea C wrote: Reason for CRM: Patient called back for MRI results. Relayed that Rosina noted in chart that it was normal. Patient had no other questions.

## 2024-06-22 ENCOUNTER — Ambulatory Visit: Admitting: Physician Assistant

## 2024-07-08 ENCOUNTER — Ambulatory Visit (INDEPENDENT_AMBULATORY_CARE_PROVIDER_SITE_OTHER)

## 2024-07-08 ENCOUNTER — Telehealth (HOSPITAL_BASED_OUTPATIENT_CLINIC_OR_DEPARTMENT_OTHER): Payer: Self-pay | Admitting: *Deleted

## 2024-07-08 ENCOUNTER — Encounter (HOSPITAL_BASED_OUTPATIENT_CLINIC_OR_DEPARTMENT_OTHER)

## 2024-07-08 ENCOUNTER — Encounter: Admitting: Family Medicine

## 2024-07-08 DIAGNOSIS — I6522 Occlusion and stenosis of left carotid artery: Secondary | ICD-10-CM | POA: Diagnosis not present

## 2024-07-08 MED ORDER — ISOSORBIDE MONONITRATE ER 30 MG PO TB24: 30.0000 mg | ORAL_TABLET | Freq: Two times a day (BID) | ORAL | 3 refills | Status: DC

## 2024-07-08 MED ORDER — HYDRALAZINE HCL 25 MG PO TABS: ORAL_TABLET | ORAL | 3 refills | Status: DC

## 2024-07-08 NOTE — Telephone Encounter (Signed)
 Patient in office today for carotid  Blood pressure elevated at 220/100, patient asymptomatic  Per patient after he takes his morning medications his blood pressure come down to 120'-140's but starts going up in the afternoon.  Takes Hydralazine  as needed and needs daily Stated after taking Hydralazine  it comes down for a little while but then goes up  Discussed with Reche ORN NP, recommendations: Increase Isosorbide  to 30 mg twice a day  Start Hydralazine  25 mg daily at lunch  Continue Hydralazine  25 mg as needed for SBP above 160  Advised patient, verbalized understanding.  Advised to go to ED if starts to have any symptoms  Has f/u with PCP 9/10

## 2024-07-13 ENCOUNTER — Telehealth: Payer: Self-pay

## 2024-07-13 NOTE — Telephone Encounter (Signed)
 Left message about PREP Class starting 9/8 and asked him to return my call.

## 2024-07-19 NOTE — Telephone Encounter (Signed)
 Recommend increase Imdur  to 60mg  BID.   Adjust Hydralazine  to 25mg  TID.   May take Hydralazine  25mg  as needed for systolic blood pressure >160.  Lovett Coffin S Candis Kabel, NP

## 2024-07-20 ENCOUNTER — Telehealth: Payer: Self-pay

## 2024-07-20 ENCOUNTER — Other Ambulatory Visit (HOSPITAL_BASED_OUTPATIENT_CLINIC_OR_DEPARTMENT_OTHER): Payer: Self-pay | Admitting: *Deleted

## 2024-07-20 MED ORDER — HYDRALAZINE HCL 25 MG PO TABS: 25.0000 mg | ORAL_TABLET | Freq: Three times a day (TID) | ORAL | 3 refills | Status: DC

## 2024-07-20 MED ORDER — ISOSORBIDE MONONITRATE ER 60 MG PO TB24: 60.0000 mg | ORAL_TABLET | Freq: Two times a day (BID) | ORAL | 3 refills | Status: DC

## 2024-07-20 NOTE — Telephone Encounter (Signed)
 Left a message back from his call, informed him about the upcoming class on 9/8 and about coming in for an initial assessment.

## 2024-07-20 NOTE — Telephone Encounter (Signed)
 7/29 results follow up medication changes made   Updated Rx's sent to [facility]

## 2024-07-21 ENCOUNTER — Telehealth: Payer: Self-pay

## 2024-07-21 NOTE — Telephone Encounter (Signed)
 Tried to return Ricardo [name]'s call about the PREP Program but his voicemail was full. He called me back and would like to join the [employer] at [place] starting Sept 8 at 12:00 M/Ws.

## 2024-07-25 NOTE — Progress Notes (Signed)
 [facility]  Patient Details  Name: Ricardo [name] MRN: [id number] Date of Birth: [birth year] Age: 83 y.o. PCP: Sebastian Beverley NOVAK, MD  Vitals:   07/25/24 1128  BP: (!) 169/84  Pulse: 74  SpO2: 97%  Weight: 179 lb 6.4 oz (81.4 kg)     [facility] Eval - 07/25/24 1100       [facility] PREP Location   [facility] PRE[facility] Location [facility]      Referral    Referring Provider Walker    Reason for referral Heart Failure;Hypertension;Inactivity;Obesitity/Overweight    Program Start Date 07/25/24      Measurement   Waist Circumference 40.5 inches    Hip Circumference 40.5 inches    Body fat 28.9 percent      Information for Trainer   Goals --   Get Better and learn exercises   Current Exercise None    Orthopedic Concerns Knees    Pertinent Medical History --   CAD, Hypertension, Thyroid    Current Barriers None      Mobility and Daily Activities   I find it easy to walk up or down two or more flights of stairs. 2    I have no trouble taking out the trash. 2    I do housework such as vacuuming and dusting on my own without difficulty. 2    I can easily lift a gallon of milk (8lbs). 4    I can easily walk a mile. 2    I have no trouble reaching into high cupboards or reaching down to pick up something from the floor. 2    I do not have trouble doing out-door work such as Loss adjuster, chartered, raking leaves, or gardening. 2      Mobility and Daily Activities   I feel younger than my age. 4    I feel independent. 4    I feel energetic. 2    I live an active life.  2    I feel strong. 2    I feel healthy. 2    I feel active as other people my age. 2      How fit and strong are you.   Fit and Strong Total Score 34         Past Medical History:  Diagnosis Date   AAA (abdominal aortic aneurysm) (HCC)    Abdominal aneurysm (HCC) 08/30/2018   Arthritis    SHOULDERS, NECK , RIGHT HIP   Bladder cancer (HCC) DX   OCT 2011--  FOLLOWED BY DR WOODRUFF   S/P TURBT,  HIGH GRADE  BLADDER CANCER   CAD (coronary artery disease), native coronary artery 08/30/2018   normal Left main, 30% stenosis ostial LAD, 50% stenosis OM 3, 80% stenosis mid RCA;  2.5 x 8 mm Tetra Stent Dr. Claudene   Coronary artery disease CARDIOLOGIST- DR BLANCA-  LAST VISIT 2 WKS AGO-- WILL REQUEST NOTE AND EKG   STRESS TEST -- JULY 2009   Essential hypertension 08/30/2018   Hyperlipidemia    Hypertension    Nocturia    Personal history of malignant neoplasm of bladder 08/30/2018   Post PTCA 2001-   X1 STENT TO RCA   AND 1996   Past Surgical History:  Procedure Laterality Date   CORONARY ANGIOPLASTY  1996   CORONARY ANGIOPLASTY WITH STENT PLACEMENT  2001   STENT TO RCA   CYSTO/ BLADDER BX  10-22-10   CYSTOSCOPY W/ RETROGRADES  11/12/2011   Procedure: CYSTOSCOPY WITH  RETROGRADE PYELOGRAM;  Surgeon: Toribio Neysa Repine, MD;  Location: [facility];  Service: Urology;  Laterality: Bilateral;   LEFT HEART CATH AND CORONARY ANGIOGRAPHY N/A 07/09/2020   Procedure: LEFT HEART CATH AND CORONARY ANGIOGRAPHY;  Surgeon: Claudene Pacific, MD;  [facility];  Service: Cardiovascular;  Laterality: N/A;   LUMBAR FUSION     TRANSURETHRAL RESECTION OF BLADDER TUMOR  08-27-10   RIGHT ANTERIOR DOME   TRANSURETHRAL RESECTION OF BLADDER TUMOR  11/12/2011   Procedure: TRANSURETHRAL RESECTION OF BLADDER TUMOR (TURBT);  Surgeon: Toribio Neysa Repine, MD;  Location: [facility];  Service: Urology;  Laterality: N/A;  with PK Gyrus c-arm gyrus  digital ureteroscope   Social History   Tobacco Use  Smoking Status Former   Current packs/day: 0.00   Average packs/day: 1.5 packs/day for 5.0 years (7.5 ttl pk-yrs)   Types: Cigarettes   Start date: 11/05/1960   Quit date: 11/05/1965   Years since quitting: 58.7  Smokeless Tobacco Never  Nieko is ready to start the PREP program on Sept 8 at the [facility], 12:00 M/W.  Suzen Ash 07/25/2024, 11:31 AM

## 2024-07-25 NOTE — Progress Notes (Signed)
 [facility] Weekly Session  Patient Details  Name: Ricardo [name] MRN: [id number] Date of Birth: 08/06/1941 Age: 83 y.o. PCP: Sebastian Beverley NOVAK, MD  There were no vitals filed for this visit.   [facility] Weekly seesion - 07/25/24 1400       [facility] PREP Location   [facility] PRE[facility] Location [facility]      Weekly Session   Topic Discussed Goal setting and welcome to the program   Went over notebook and strength training log, tour of facility and went over class schedule   Classes attended to date 1          [facility] 07/25/2024, 2:02 PM

## 2024-07-27 ENCOUNTER — Encounter: Payer: Self-pay | Admitting: Family Medicine

## 2024-07-27 ENCOUNTER — Ambulatory Visit (INDEPENDENT_AMBULATORY_CARE_PROVIDER_SITE_OTHER): Admitting: Family Medicine

## 2024-07-27 VITALS — BP 170/88 | HR 62 | Temp 97.0°F | Resp 18 | Wt 179.4 lb

## 2024-07-27 DIAGNOSIS — R7301 Impaired fasting glucose: Secondary | ICD-10-CM | POA: Insufficient documentation

## 2024-07-27 DIAGNOSIS — Z8551 Personal history of malignant neoplasm of bladder: Secondary | ICD-10-CM | POA: Diagnosis not present

## 2024-07-27 DIAGNOSIS — I10 Essential (primary) hypertension: Secondary | ICD-10-CM

## 2024-07-27 DIAGNOSIS — R319 Hematuria, unspecified: Secondary | ICD-10-CM | POA: Insufficient documentation

## 2024-07-27 DIAGNOSIS — Z0000 Encounter for general adult medical examination without abnormal findings: Secondary | ICD-10-CM

## 2024-07-27 DIAGNOSIS — E782 Mixed hyperlipidemia: Secondary | ICD-10-CM | POA: Diagnosis not present

## 2024-07-27 DIAGNOSIS — M75101 Unspecified rotator cuff tear or rupture of right shoulder, not specified as traumatic: Secondary | ICD-10-CM | POA: Insufficient documentation

## 2024-07-27 DIAGNOSIS — Z955 Presence of coronary angioplasty implant and graft: Secondary | ICD-10-CM | POA: Insufficient documentation

## 2024-07-27 DIAGNOSIS — I2510 Atherosclerotic heart disease of native coronary artery without angina pectoris: Secondary | ICD-10-CM | POA: Diagnosis not present

## 2024-07-27 DIAGNOSIS — I129 Hypertensive chronic kidney disease with stage 1 through stage 4 chronic kidney disease, or unspecified chronic kidney disease: Secondary | ICD-10-CM | POA: Insufficient documentation

## 2024-07-27 DIAGNOSIS — I714 Abdominal aortic aneurysm, without rupture, unspecified: Secondary | ICD-10-CM | POA: Diagnosis not present

## 2024-07-27 DIAGNOSIS — R3914 Feeling of incomplete bladder emptying: Secondary | ICD-10-CM | POA: Diagnosis not present

## 2024-07-27 DIAGNOSIS — C679 Malignant neoplasm of bladder, unspecified: Secondary | ICD-10-CM | POA: Insufficient documentation

## 2024-07-27 DIAGNOSIS — R809 Proteinuria, unspecified: Secondary | ICD-10-CM | POA: Insufficient documentation

## 2024-07-27 DIAGNOSIS — D696 Thrombocytopenia, unspecified: Secondary | ICD-10-CM | POA: Insufficient documentation

## 2024-07-27 DIAGNOSIS — N182 Chronic kidney disease, stage 2 (mild): Secondary | ICD-10-CM | POA: Diagnosis not present

## 2024-07-27 DIAGNOSIS — N529 Male erectile dysfunction, unspecified: Secondary | ICD-10-CM | POA: Insufficient documentation

## 2024-07-27 DIAGNOSIS — N401 Enlarged prostate with lower urinary tract symptoms: Secondary | ICD-10-CM

## 2024-07-27 DIAGNOSIS — E46 Unspecified protein-calorie malnutrition: Secondary | ICD-10-CM | POA: Insufficient documentation

## 2024-07-27 DIAGNOSIS — K219 Gastro-esophageal reflux disease without esophagitis: Secondary | ICD-10-CM | POA: Insufficient documentation

## 2024-07-27 DIAGNOSIS — G8929 Other chronic pain: Secondary | ICD-10-CM | POA: Insufficient documentation

## 2024-07-27 DIAGNOSIS — B9689 Other specified bacterial agents as the cause of diseases classified elsewhere: Secondary | ICD-10-CM | POA: Insufficient documentation

## 2024-07-27 MED ORDER — CHLORTHALIDONE 25 MG PO TABS: 12.5000 mg | ORAL_TABLET | Freq: Every day | ORAL | 3 refills | Status: DC

## 2024-07-27 NOTE — Progress Notes (Signed)
 Assessment  Assessment/Plan:  Assessment and Plan Assessment & Plan Resistant hypertension Labile blood pressure with significant elevation at 170/88 mmHg, consistent with home readings ranging from 166/88 to 180/77 mmHg. History of hypotension secondary to medications. Currently on telmisartan , hydralazine , and Imdur . Previous trials of clonidine  and amlodipine were ineffective. - Reintroduce low-dose chlorthalidone  12.5 mg - Order CMP to assess GFR and electrolytes - Order urinalysis with microscopic reflex culture - Order TSH - Order CBC - Monitor blood pressure at home - Follow up in 2 weeks for blood pressure evaluation and lab work  Chronic kidney disease, stage 2 CKD stage 2 with need to monitor renal function, especially in the context of hypertension management. 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 Secondary prevention with aspirin  81 mg daily and rosuvastatin  5 mg daily. No new symptoms of chest pain or shortness of breath. - Continue Imdur  mononitrate 60 mg twice daily - Continue aspirin  81 mg daily - Continue rosuvastatin  5 mg daily - Order fasting lipid panel  Mixed hyperlipidemia Managed with rosuvastatin  5 mg daily. Lipid stratification needed. - Continue rosuvastatin  5 mg daily - Order fasting lipid panel  Abdominal aortic aneurysm without rupture No acute issues reported.  Benign prostatic hyperplasia with lower urinary tract symptoms BPH with LUTS managed with tamsulosin 0.4 mg daily. Tamsulosin may affect blood pressure. - Continue tamsulosin 0.4 mg daily - Order PSA  Impaired fasting glucose Monitoring for hyperglycemia or diabetes is necessary. - Order hemoglobin A1c - Order fasting blood sugar   No follow-ups on file.        Subjective:   Encounter date:  07/27/2024  Discussed the use of AI scribe software for clinical note transcription with the patient, who gave verbal consent to proceed.  History of Present Illness Ricardo [name] is an 83 year old male with resistant hypertension and coronary artery disease who presents for an annual physical exam.  Hypertension - Resistant hypertension with home blood pressure readings ranging from 166/88 to 180/77 mmHg - Occasional headaches attributed to elevated blood pressure - Current antihypertensive regimen includes telmisartan  80 mg daily, hydralazine  25 mg three times a day, and Imdur  mononitrate 60 mg twice a day - Clonidine  and amlodipine previously ineffective - Not currently taking chlorthalidone   Coronary artery disease and angina - History of coronary artery disease with stable angina - Angina managed with Imdur  mononitrate 60 mg twice daily - No chest pain or shortness of breath - Secondary prevention regimen includes aspirin  81 mg daily, rosuvastatin  5 mg daily, and Repatha  140 mg injection every two weeks  Abdominal aortic aneurysm - History of abdominal aortic aneurysm without rupture  Chronic kidney disease - Chronic kidney disease stage 2  Lower urinary tract symptoms - Benign prostatic hyperplasia with lower urinary tract symptoms - Managed with tamsulosin 0.4 mg daily  History of bladder cancer - History of bladder cancer, successfully treated       07/27/2024    1:23 PM 03/08/2024    2:05 PM  Depression screen PHQ 2/9  Decreased Interest 0 1  Down, Depressed, Hopeless 0 2  PHQ - 2 Score 0 3  Altered sleeping 0 0  Tired, decreased energy 3 2  Change in appetite 0 2  Feeling bad or failure about yourself  0 2  Trouble concentrating 0 0  Moving slowly or fidgety/restless  2 2  Suicidal thoughts 0 0  PHQ-9 Score 5 11  Difficult doing work/chores Somewhat difficult Not difficult at all       07/27/2024    1:24 PM 03/08/2024    2:05 PM  GAD 7 : Generalized  Anxiety Score  Nervous, Anxious, on Edge 2 2  Control/stop worrying 0 0  Worry too much - different things 0 0  Trouble relaxing 2 2  Restless 0 0  Easily annoyed or irritable 2 2  Afraid - awful might happen 0 0  Total GAD 7 Score 6 6  Anxiety Difficulty Somewhat difficult Somewhat difficult    Health Maintenance Due  Topic Date Due   COVID-19 Vaccine (1) Never done      PMH:  The following were reviewed and entered/updated in epic: Past Medical History:  Diagnosis Date   AAA (abdominal aortic aneurysm) (HCC)    Abdominal aneurysm (HCC) 08/30/2018   Arthritis    SHOULDERS, NECK , RIGHT HIP   Bladder cancer (HCC) DX   OCT 2011--  FOLLOWED BY DR WOODRUFF   S/P TURBT,  HIGH GRADE BLADDER CANCER   CAD (coronary artery disease), native coronary artery 08/30/2018   normal Left main, 30% stenosis ostial LAD, 50% stenosis OM 3, 80% stenosis mid RCA;  2.5 x 8 mm Tetra Stent Dr. Claudene   Coronary artery disease CARDIOLOGIST- DR BLANCA-  LAST VISIT 2 WKS AGO-- WILL REQUEST NOTE AND EKG   STRESS TEST -- JULY 2009   Essential hypertension 08/30/2018   Hyperlipidemia    Hypertension    Nocturia    Personal history of malignant neoplasm of bladder 08/30/2018   Post PTCA 2001-   X1 STENT TO RCA   AND 1996    Patient Active Problem List   Diagnosis Date Noted   Bacterial sinusitis 07/27/2024   Chronic pain 07/27/2024   Erectile dysfunction 07/27/2024   Gastroesophageal reflux disease without esophagitis 07/27/2024   Hematuria 07/27/2024   History of primary malignant neoplasm of urinary bladder 07/27/2024   Impaired fasting glucose 07/27/2024   Malnutrition (HCC) 07/27/2024   Presence of coronary angioplasty implant and graft 07/27/2024   Microalbuminuria 07/27/2024   Chronic kidney disease due to hypertension 07/27/2024   Stage 2 chronic kidney disease 07/27/2024   Tear of right rotator cuff 07/27/2024   Thrombocytopenia (HCC) 07/27/2024   Amaurosis fugax 06/07/2024    Cervical disc disease 06/07/2024   Chronic post-traumatic headache, not intractable 06/07/2024   Benign prostatic hyperplasia with lower urinary tract symptoms 06/07/2024   Resistant hypertension 03/24/2024   Nasal valve collapse 10/03/2022   Acquired trigger finger of right ring finger 05/09/2022   Pain in finger of right hand 05/06/2022   Hypertensive emergency 05/12/2021   Sinus bradycardia 05/11/2021   Unstable angina (HCC) 07/10/2020   Abdominal aortic aneurysm (AAA) without rupture (HCC)    Chest pain at rest 07/08/2020   Bradycardia 07/07/2020   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   Deviated nasal septum 04/05/2020   CAD (coronary artery disease), native coronary artery 08/30/2018   Hyperlipidemia 08/30/2018   Essential hypertension 08/30/2018   Abdominal aneurysm (HCC) 08/30/2018   Personal history of malignant neoplasm of bladder 08/30/2018    Past Surgical History:  Procedure Laterality Date   CORONARY ANGIOPLASTY  1996   CORONARY ANGIOPLASTY WITH STENT PLACEMENT  2001   STENT TO RCA   CYSTO/ BLADDER BX  10-22-10   CYSTOSCOPY W/ RETROGRADES  11/12/2011   Procedure: CYSTOSCOPY WITH RETROGRADE PYELOGRAM;  Surgeon: Toribio Neysa Repine, MD;  Location: [facility];  Service: Urology;  Laterality: Bilateral;   LEFT HEART CATH AND CORONARY ANGIOGRAPHY N/A 07/09/2020   Procedure: LEFT HEART CATH AND CORONARY ANGIOGRAPHY;  Surgeon: Claudene Pacific, MD;  [facility];  Service: Cardiovascular;  Laterality: N/A;   LUMBAR FUSION     TRANSURETHRAL RESECTION OF BLADDER TUMOR  08-27-10   RIGHT ANTERIOR DOME   TRANSURETHRAL RESECTION OF BLADDER TUMOR  11/12/2011   Procedure: TRANSURETHRAL RESECTION OF BLADDER TUMOR (TURBT);  Surgeon: Toribio Neysa Repine, MD;  Location: [facility];  Service: Urology;  Laterality: N/A;  with PK Gyrus c-arm gyrus  digital ureteroscope    Family History  Problem Relation Age of Onset   Diabetes Mother     Hypertension Mother     Medications- reviewed and updated Outpatient Medications Prior to Visit  Medication Sig Dispense Refill   acetaminophen  (TYLENOL ) 500 MG tablet Take 1,000 mg by mouth daily as needed for headache (pain).     aspirin  81 MG tablet Take 1 tablet (81 mg total) by mouth daily. 1 tablet 0   Evolocumab  (REPATHA  SURECLICK) 140 MG/ML SOAJ Inject 140 mg into the skin See admin instructions. Inject 140 mg subcutaneously on the 1st and 15th of each month 2 mL 11   hydrALAZINE  (APRESOLINE ) 25 MG tablet Take 1 tablet (25 mg total) by mouth 3 (three) times daily. Ok to take 1 tablet as need for blood pressure greater than 160 systolic 285 tablet 3   isosorbide  mononitrate (IMDUR ) 60 MG 24 hr tablet Take 1 tablet (60 mg total) by mouth in the morning and at bedtime. 180 tablet 3   MYRBETRIQ 25 MG TB24 tablet Take 25 mg by mouth daily.     nitroGLYCERIN  (NITROSTAT ) 0.4 MG SL tablet Place 1 tablet (0.4 mg total) under the tongue every 5 (five) minutes as needed. 25 tablet 3   rosuvastatin  (CRESTOR ) 5 MG tablet Take 1 tablet (5 mg total) by mouth daily. 90 tablet 1   tamsulosin (FLOMAX) 0.4 MG CAPS capsule Take 0.4 mg by mouth daily after supper.     telmisartan  (MICARDIS ) 80 MG tablet Take 1 tablet (80 mg total) by mouth daily. 90 tablet 3   chlorthalidone  (HYGROTON ) 25 MG tablet Take 0.5 tablets (12.5 mg total) by mouth daily. (Patient not taking: Reported on 07/27/2024) 45 tablet 3   No facility-administered medications prior to visit.    Allergies  Allergen Reactions   Clonidine  Hcl Other (See Comments)    Reaction to 0.2 mg tabs - caused dry mouth, drowsiness, lightheadedness - reported 07/08/2020 (no reaction to patch)   Cyclobenzaprine Other (See Comments)    Numbness in lips   Norvasc [Amlodipine Besylate] Other (See Comments)    CAUSES TEETH TO FALL OUT    Social History   Socioeconomic History   Marital status: Married    Spouse name: Not on file   Number of  children: 4   Years of education: Not on file   Highest education level: Not on file  Occupational History   Occupation: retired - Advice worker  Tobacco Use   Smoking status: Former    Current packs/day: 0.00    Average packs/day: 1.5 packs/day for 5.0 years (7.5 ttl pk-yrs)    Types: Cigarettes    Start date: 11/05/1960    Quit date: 11/05/1965    Years since quitting: 58.7   Smokeless tobacco: Never  Vaping Use   Vaping  status: Never Used  Substance and Sexual Activity   Alcohol use: No   Drug use: No   Sexual activity: Not Currently    Birth control/protection: None  Other Topics Concern   Not on file  Social History Narrative   Not on file   Social Drivers of Health   Financial Resource Strain: Not on file  Food Insecurity: Not on file  Transportation Needs: Not on file  Physical Activity: Not on file  Stress: Not on file  Social Connections: Not on file           Objective:  Physical Exam: BP (!) 170/88 (BP Location: Right Arm, Patient Position: Sitting, Cuff Size: Normal) Comment: recheck done manual  Pulse 62   Temp (!) 97 F (36.1 C) (Temporal)   Resp 18   Wt 179 lb 6.4 oz (81.4 kg)   SpO2 100%   BMI 28.96 kg/m   Body mass index is 28.96 kg/m. Wt Readings from Last 3 Encounters:  07/27/24 179 lb 6.4 oz (81.4 kg)  07/25/24 179 lb 6.4 oz (81.4 kg)  06/14/24 180 lb 1.6 oz (81.7 kg)    Physical Exam VITALS: BP- 170/88 GENERAL: Alert, cooperative, well developed, no acute distress. HEENT: Normocephalic, normal oropharynx, moist mucous membranes. CHEST: Clear to auscultation bilaterally, no wheezes, rhonchi, or crackles. CARDIOVASCULAR: Normal heart rate and rhythm, S1 and S2 normal without murmurs. ABDOMEN: Soft, non-tender, non-distended, without organomegaly, normal bowel sounds. EXTREMITIES: No cyanosis, edema, or leg swelling. NEUROLOGICAL: Cranial nerves grossly intact, moves all extremities without gross motor or sensory deficit.  Prior  labs:   Recent Results (from the past 2160 hours)  Comp Met (CMET)     Status: None   Collection Time: 06/07/24  2:10 PM  Result Value Ref Range   Sodium 139 135 - 145 mEq/L   Potassium 3.9 3.5 - 5.1 mEq/L   Chloride 104 96 - 112 mEq/L   CO2 27 19 - 32 mEq/L   Glucose, Bld 99 70 - 99 mg/dL   BUN 22 6 - 23 mg/dL   Creatinine, Ser 8.96 0.40 - 1.50 mg/dL   Total Bilirubin 0.6 0.2 - 1.2 mg/dL   Alkaline Phosphatase 60 39 - 117 U/L   AST 20 0 - 37 U/L   ALT 17 0 - 53 U/L   Total Protein 6.9 6.0 - 8.3 g/dL   Albumin 4.5 3.5 - 5.2 g/dL   GFR 32.75 >39.99 mL/min    Comment: Calculated using the CKD-EPI Creatinine Equation (2021)   Calcium  9.6 8.4 - 10.5 mg/dL  CBC w/Diff     Status: Abnormal   Collection Time: 06/07/24  2:10 PM  Result Value Ref Range   WBC 6.5 4.0 - 10.5 K/uL   RBC 4.93 4.22 - 5.81 Mil/uL   Hemoglobin 13.9 13.0 - 17.0 g/dL   HCT 58.3 60.9 - 47.9 %   MCV 84.4 78.0 - 100.0 fl   MCHC 33.3 30.0 - 36.0 g/dL   RDW 87.1 88.4 - 84.4 %   Platelets 133.0 (L) 150.0 - 400.0 K/uL   Neutrophils Relative % 63.6 43.0 - 77.0 %   Lymphocytes Relative 22.6 12.0 - 46.0 %   Monocytes Relative 12.2 (H) 3.0 - 12.0 %   Eosinophils Relative 1.0 0.0 - 5.0 %   Basophils Relative 0.6 0.0 - 3.0 %   Neutro Abs 4.1 1.4 - 7.7 K/uL   Lymphs Abs 1.5 0.7 - 4.0 K/uL   Monocytes Absolute 0.8 0.1 - 1.0 K/uL  Eosinophils Absolute 0.1 0.0 - 0.7 K/uL   Basophils Absolute 0.0 0.0 - 0.1 K/uL  Sedimentation rate     Status: None   Collection Time: 06/08/24  4:03 PM  Result Value Ref Range   Sed Rate 8 0 - 20 mm/hr  Catecholamines, fractionated, plasma     Status: Abnormal   Collection Time: 06/09/24  3:36 PM  Result Value Ref Range   Norepinephrine 838 (H) 115 - 524 pg/mL   Epinephrine 26.3 0.0 - 55.4 pg/mL   Dopamine 18.2 0.0 - 36.7 pg/mL    Comment: Catecholamines, Plasma reference intervals based on patient in supine position for at least 20 minutes.   Metanephrines, plasma     Status: None    Collection Time: 06/09/24  3:36 PM  Result Value Ref Range   Normetanephrine, Free 80.1 0.0 - 297.2 pg/mL   Metanephrine, Free <25.0 0.0 - 88.0 pg/mL  TSH     Status: None   Collection Time: 06/09/24  3:36 PM  Result Value Ref Range   TSH 1.790 0.450 - 4.500 uIU/mL  Aldosterone + renin activity w/ ratio     Status: None   Collection Time: 06/09/24  3:36 PM  Result Value Ref Range   Aldosterone 3.4 0.0 - 30.0 ng/dL   Renin Activity, Plasma 0.876 0.167 - 5.380 ng/mL/hr   Aldos/Renin Ratio 3.9 0.0 - 30.0    Comment:                          Units:      ng/dL per ng/mL/hr    Lab Results  Component Value Date   CHOL 100 02/18/2021   CHOL 177 07/09/2020   CHOL 168 10/25/2018   Lab Results  Component Value Date   HDL 50 02/18/2021   HDL 42 07/09/2020   HDL 65 10/25/2018   Lab Results  Component Value Date   LDLCALC 34 02/18/2021   LDLCALC 120 (H) 07/09/2020   LDLCALC 82 10/25/2018   Lab Results  Component Value Date   TRIG 78 02/18/2021   TRIG 76 07/09/2020   TRIG 105 10/25/2018   Lab Results  Component Value Date   CHOLHDL 2.0 02/18/2021   CHOLHDL 4.2 07/09/2020   CHOLHDL 2.6 10/25/2018   No results found for: LDLDIRECT  Last metabolic panel Lab Results  Component Value Date   GLUCOSE 99 06/07/2024   NA 139 06/07/2024   K 3.9 06/07/2024   CL 104 06/07/2024   CO2 27 06/07/2024   BUN 22 06/07/2024   CREATININE 1.03 06/07/2024   GFR 67.24 06/07/2024   CALCIUM  9.6 06/07/2024   PROT 6.9 06/07/2024   ALBUMIN 4.5 06/07/2024   BILITOT 0.6 06/07/2024   ALKPHOS 60 06/07/2024   AST 20 06/07/2024   ALT 17 06/07/2024   ANIONGAP 10 02/03/2024    No results found for: HGBA1C  Last CBC Lab Results  Component Value Date   WBC 6.5 06/07/2024   HGB 13.9 06/07/2024   HCT 41.6 06/07/2024   MCV 84.4 06/07/2024   MCH 28.3 02/03/2024   RDW 12.8 06/07/2024   PLT 133.0 (L) 06/07/2024    Lab Results  Component Value Date   TSH 1.790 06/09/2024    No  results found for: PSA1, PSA  Last vitamin D No results found for: MARIEN BOLLS, [facility]  Lab Results  Component Value Date   BILIRUBINUR NEGATIVE 02/03/2024   PROTEINUR NEGATIVE 02/03/2024   LEUKOCYTESUR NEGATIVE 02/03/2024  No results found for: LABMICR, MICROALBUR   At today's visit, we discussed treatment options, associated risk and benefits, and engage in counseling as needed.  Additionally the following were reviewed: Past medical records, past medical and surgical history, family and social background, as well as relevant laboratory results, imaging findings, and specialty notes, where applicable.  This message was generated using dictation software, and as a result, it may contain unintentional typos or errors.  Nevertheless, extensive effort was made to accurately convey at the pertinent aspects of the patient visit.    There may have been are other unrelated non-urgent complaints, but due to the busy schedule and the amount of time already spent with him, time does not permit to address these issues at today's visit. Another appointment may have or has been requested to review these additional issues.     Arvella Hummer, MD, MS

## 2024-07-27 NOTE — Patient Instructions (Addendum)
 Restart taking chlorthalidone  12.5 mg daily

## 2024-08-01 NOTE — Progress Notes (Signed)
 [facility]  Patient Details  Name: Ricardo [name] MRN: [id number] Date of Birth: May 14, 1941 Age: 83 y.o. PCP: Sebastian Beverley NOVAK, MD  Vitals:   08/01/24 1445  Weight: 174 lb (78.9 kg)     [facility] Weekly seesion - 08/01/24 1400       [facility] PREP Location   [facility] PREP Location [facility]      Weekly Session   Topic Discussed Importance of resistance training;Other ways to be active   Discussed the different between strength training and cardio.   Classes attended to date 2          Suzen Ash 08/01/2024, 2:46 PM

## 2024-08-04 ENCOUNTER — Institutional Professional Consult (permissible substitution) (HOSPITAL_BASED_OUTPATIENT_CLINIC_OR_DEPARTMENT_OTHER): Admitting: Family

## 2024-08-08 ENCOUNTER — Telehealth: Payer: Self-pay

## 2024-08-08 NOTE — Progress Notes (Signed)
 [facility] Weekly Session  Patient Details  Name: Ricardo [name] MRN: [id number] Date of Birth: 1941-07-23 Age: 83 y.o. PCP: Sebastian Beverley NOVAK, MD  Vitals:   08/08/24 1532  Weight: 174 lb 3.2 oz (79 kg)     [facility] Weekly seesion - 08/08/24 1500       [facility] PREP Location   [facility] PREP Location [facility]      Weekly Session   Topic Discussed Healthy eating tips   Talk about foods to avoid and increase, sugar and sodium limits. Introduced [employer].   Minutes exercised this week 82 minutes    Classes attended to date 4          Suzen Ash 08/08/2024, 3:34 PM

## 2024-08-08 NOTE — Progress Notes (Unsigned)
 Complex Care Management Note Care Guide Note  08/08/2024 Name: Ricardo [name] MRN: [id number] DOB: 1941/02/03   Complex Care Management Outreach Attempts: An unsuccessful telephone outreach was attempted today to offer the patient information about available complex care management services.  Follow Up Plan:  Additional outreach attempts will be made to offer the patient complex care management information and services.   Encounter Outcome:  No Answer  Dreama [facility]  [facility], [facility] VBCI Assistant Direct Dial: [phone]  Fax: [phone]

## 2024-08-09 ENCOUNTER — Encounter: Payer: Self-pay | Admitting: Family Medicine

## 2024-08-09 ENCOUNTER — Ambulatory Visit (INDEPENDENT_AMBULATORY_CARE_PROVIDER_SITE_OTHER): Admitting: Family Medicine

## 2024-08-09 DIAGNOSIS — Z0000 Encounter for general adult medical examination without abnormal findings: Secondary | ICD-10-CM | POA: Diagnosis not present

## 2024-08-09 DIAGNOSIS — R3914 Feeling of incomplete bladder emptying: Secondary | ICD-10-CM

## 2024-08-09 DIAGNOSIS — I2510 Atherosclerotic heart disease of native coronary artery without angina pectoris: Secondary | ICD-10-CM | POA: Diagnosis not present

## 2024-08-09 DIAGNOSIS — N182 Chronic kidney disease, stage 2 (mild): Secondary | ICD-10-CM | POA: Diagnosis not present

## 2024-08-09 DIAGNOSIS — I10 Essential (primary) hypertension: Secondary | ICD-10-CM | POA: Diagnosis not present

## 2024-08-09 DIAGNOSIS — D696 Thrombocytopenia, unspecified: Secondary | ICD-10-CM | POA: Diagnosis not present

## 2024-08-09 DIAGNOSIS — I129 Hypertensive chronic kidney disease with stage 1 through stage 4 chronic kidney disease, or unspecified chronic kidney disease: Secondary | ICD-10-CM

## 2024-08-09 DIAGNOSIS — N401 Enlarged prostate with lower urinary tract symptoms: Secondary | ICD-10-CM | POA: Diagnosis not present

## 2024-08-09 LAB — CBC WITH DIFFERENTIAL/PLATELET
Basophils Absolute: 0.1 K/uL (ref 0.0–0.1)
Basophils Relative: 0.8 % (ref 0.0–3.0)
Eosinophils Absolute: 0.1 K/uL (ref 0.0–0.7)
Eosinophils Relative: 1.4 % (ref 0.0–5.0)
HCT: 42 % (ref 39.0–52.0)
Hemoglobin: 13.9 g/dL (ref 13.0–17.0)
Lymphocytes Relative: 25.5 % (ref 12.0–46.0)
Lymphs Abs: 1.7 K/uL (ref 0.7–4.0)
MCHC: 33 g/dL (ref 30.0–36.0)
MCV: 85.2 fl (ref 78.0–100.0)
Monocytes Absolute: 0.7 K/uL (ref 0.1–1.0)
Monocytes Relative: 10.8 % (ref 3.0–12.0)
Neutro Abs: 4 K/uL (ref 1.4–7.7)
Neutrophils Relative %: 61.5 % (ref 43.0–77.0)
Platelets: 131 K/uL — ABNORMAL LOW (ref 150.0–400.0)
RBC: 4.93 Mil/uL (ref 4.22–5.81)
RDW: 12.8 % (ref 11.5–15.5)
WBC: 6.5 K/uL (ref 4.0–10.5)

## 2024-08-09 LAB — COMPREHENSIVE METABOLIC PANEL WITH GFR
ALT: 14 U/L (ref 0–53)
AST: 19 U/L (ref 0–37)
Albumin: 4.6 g/dL (ref 3.5–5.2)
Alkaline Phosphatase: 62 U/L (ref 39–117)
BUN: 27 mg/dL — ABNORMAL HIGH (ref 6–23)
CO2: 27 meq/L (ref 19–32)
Calcium: 9.6 mg/dL (ref 8.4–10.5)
Chloride: 105 meq/L (ref 96–112)
Creatinine, Ser: 1.03 mg/dL (ref 0.40–1.50)
GFR: 67.16 mL/min (ref >60.00)
Glucose, Bld: 104 mg/dL — ABNORMAL HIGH (ref 70–99)
Potassium: 4 meq/L (ref 3.5–5.1)
Sodium: 141 meq/L (ref 135–145)
Total Bilirubin: 0.7 mg/dL (ref 0.2–1.2)
Total Protein: 6.8 g/dL (ref 6.0–8.3)

## 2024-08-09 LAB — TSH: TSH: 1.7 u[IU]/mL (ref 0.35–5.50)

## 2024-08-09 LAB — LIPID PANEL
Cholesterol: 125 mg/dL (ref 0–200)
HDL: 45.7 mg/dL (ref >39.00)
LDL Cholesterol: 55 mg/dL (ref 0–99)
NonHDL: 79.46
Total CHOL/HDL Ratio: 3
Triglycerides: 123 mg/dL (ref 0.0–149.0)
VLDL: 24.6 mg/dL (ref 0.0–40.0)

## 2024-08-09 LAB — PSA: PSA: 5.76 ng/mL — ABNORMAL HIGH (ref 0.10–4.00)

## 2024-08-09 LAB — MICROALBUMIN / CREATININE URINE RATIO
Creatinine,U: 86.5 mg/dL
Microalb Creat Ratio: 82.7 mg/g — ABNORMAL HIGH (ref 0.0–30.0)
Microalb, Ur: 7.2 mg/dL — ABNORMAL HIGH (ref 0.0–1.9)

## 2024-08-09 LAB — HEMOGLOBIN A1C: Hgb A1c MFr Bld: 6.2 % (ref 4.6–6.5)

## 2024-08-09 MED ORDER — ALFUZOSIN HCL ER 10 MG PO TB24
10.0000 mg | ORAL_TABLET | Freq: Every day | ORAL | 1 refills | Status: DC
Start: 2024-08-09 — End: 2024-08-23

## 2024-08-09 NOTE — Progress Notes (Unsigned)
 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 Polyuria and urinary urgency are side effects of chlorthalidone . Homocysteine levels discussed, but broad lab work prioritized. - Discontinue chlorthalidone  due to urinary side effects - Initiate alfuzosin  to replace tamsulosin for dual benefit of managing BPH and lowering blood pressure - Order comprehensive metabolic panel to assess renal function - Order urinalysis with microscopic reflex culture - Order TSH to evaluate thyroid  function - Order fasting lipid panel - Refer to cardiology for further management of resistant hypertension  Benign prostatic hyperplasia with lower urinary tract symptoms BPH with lower urinary tract symptoms managed with tamsulosin. Urinary urgency and polyuria exacerbated by chlorthalidone . - Initiate alfuzosin  to manage BPH and assist with blood pressure control - Recheck PSA levels  Abdominal aortic aneurysm Well-managed abdominal aortic aneurysm with ongoing management. - Continue current management with aspirin  and statin therapy  Hyperlipidemia Hyperlipidemia managed with Repatha  and statin therapy. - Order fasting lipid panel to assess current lipid levels      There are no discontinued medications.  No follow-ups on file.        Subjective:   Encounter date: 08/09/2024  Ricardo [name] is a 83 y.o. male who has CAD (coronary artery disease), native coronary artery; Hyperlipidemia; Essential hypertension; Abdominal aneurysm; Personal history of malignant neoplasm of bladder; Bradycardia; Chest pain at rest; Abdominal aortic aneurysm (AAA) without rupture; Unstable angina (HCC); Sinus bradycardia; Hypertensive  emergency; Pain in finger of right hand; Nasal valve collapse; Muscle tension dysphonia; Deviated nasal septum; Acquired trigger finger of right ring finger; Resistant hypertension; Amaurosis fugax; Cervical disc disease; Chronic post-traumatic headache, not intractable; Benign prostatic hyperplasia with lower urinary tract symptoms; Bacterial sinusitis; Chronic pain; Erectile dysfunction; Gastroesophageal reflux disease without esophagitis; Hematuria; History of primary malignant neoplasm of urinary bladder; Impaired fasting glucose; Malnutrition; Presence of coronary angioplasty implant and graft; Microalbuminuria; Chronic kidney disease due to hypertension; Stage 2 chronic kidney disease; Tear of right rotator cuff; and Thrombocytopenia on their problem list..   He  has a past medical history of AAA (abdominal aortic aneurysm), Abdominal aneurysm (08/30/2018), Arthritis, Bladder cancer (HCC) (DX   OCT 2011--  FOLLOWED BY DR ELIZA), CAD (coronary artery disease), native coronary artery (08/30/2018), Coronary artery disease (CARDIOLOGIST- DR BLANCA-  LAST VISIT 2 WKS AGO-- WILL REQUEST NOTE AND EKG), Essential hypertension (08/30/2018), Hyperlipidemia, Hypertension, Nocturia, Personal history of malignant neoplasm of bladder (08/30/2018), and Post PTCA (2001-   X1 STENT TO RCA).SABRA   He presents with chief complaint of Hypertension (2 week follow up. Pt is fasting today. Pt blood readings at home Thursday range between 99/43 due to patient taking all 3 RX with elevated reading over the weekend ranges from 165/85-197/93//HM due- vaccinations (patient declined) ) .   Discussed the use of AI scribe software for clinical note transcription with the patient, who gave verbal consent to proceed.  History of Present Illness Ricardo [name] is an 83 year old male with resistant hypertension who presents for a follow-up on blood pressure management. He is accompanied by his grandmother.  Blood pressure  fluctuations and antihypertensive therapy - Resistant hypertension with current regimen: hydralazine  25 mg three times daily, isosorbide  60 mg twice daily, telmisartan  80 mg daily, chlorthalidone  12.5 mg  daily (added at last visit) - Previous blood pressure at last visit: 170/88 mmHg - Current blood pressure: 126/68 mmHg - Blood pressure fluctuates between 121 and 197 mmHg within short periods - History of use of multiple antihypertensive agents, including prior use of clonidine   Diuretic-induced lower urinary tract symptoms - Chlorthalidone  causes increased urinary urgency - Describes urgency as, 'when I got to go, I got to go.'  Benign prostatic hyperplasia and lower urinary tract symptoms - On tamsulosin 0.4 mg daily for lower urinary tract symptoms - No prior use of alfuzosin   Hyperlipidemia management and medication intolerance - On Repatha  140 mg injection every two weeks and aspirin  81 mg daily - Prior use of atorvastatin  resulted in dental issues  Abdominal aortic aneurysm surveillance - History of abdominal aortic aneurysm without rupture - On medication for aneurysm stability  Renal artery evaluation - Underwent renal artery duplex evaluation     ROS  Past Surgical History:  Procedure Laterality Date  . CORONARY ANGIOPLASTY  1996  . CORONARY ANGIOPLASTY WITH STENT PLACEMENT  2001   STENT TO RCA  . CYSTO/ BLADDER BX  10-22-10  . CYSTOSCOPY W/ RETROGRADES  11/12/2011   Procedure: CYSTOSCOPY WITH RETROGRADE PYELOGRAM;  Surgeon: Toribio Neysa Repine, MD;  Location: [facility];  Service: Urology;  Laterality: Bilateral;  . LEFT HEART CATH AND CORONARY ANGIOGRAPHY N/A 07/09/2020   Procedure: LEFT HEART CATH AND CORONARY ANGIOGRAPHY;  Surgeon: Claudene Pacific, MD;  Location: [facility];  Service: Cardiovascular;  Laterality: N/A;  . LUMBAR FUSION    . TRANSURETHRAL RESECTION OF BLADDER TUMOR  08-27-10   RIGHT ANTERIOR DOME  . TRANSURETHRAL  RESECTION OF BLADDER TUMOR  11/12/2011   Procedure: TRANSURETHRAL RESECTION OF BLADDER TUMOR (TURBT);  Surgeon: Toribio Neysa Repine, MD;  Location: [facility];  Service: Urology;  Laterality: N/A;  with PK Gyrus c-arm gyrus  digital ureteroscope    Outpatient Medications Prior to Visit  Medication Sig Dispense Refill  . acetaminophen  (TYLENOL ) 500 MG tablet Take 1,000 mg by mouth daily as needed for headache (pain).    . aspirin  81 MG tablet Take 1 tablet (81 mg total) by mouth daily. 1 tablet 0  . chlorthalidone  (HYGROTON ) 25 MG tablet Take 0.5 tablets (12.5 mg total) by mouth daily. 45 tablet 3  . Evolocumab  (REPATHA  SURECLICK) 140 MG/ML SOAJ Inject 140 mg into the skin See admin instructions. Inject 140 mg subcutaneously on the 1st and 15th of each month 2 mL 11  . hydrALAZINE  (APRESOLINE ) 25 MG tablet Take 1 tablet (25 mg total) by mouth 3 (three) times daily. Ok to take 1 tablet as need for blood pressure greater than 160 systolic 285 tablet 3  . isosorbide  mononitrate (IMDUR ) 60 MG 24 hr tablet Take 1 tablet (60 mg total) by mouth in the morning and at bedtime. 180 tablet 3  . MYRBETRIQ 25 MG TB24 tablet Take 25 mg by mouth daily.    . nitroGLYCERIN  (NITROSTAT ) 0.4 MG SL tablet Place 1 tablet (0.4 mg total) under the tongue every 5 (five) minutes as needed. 25 tablet 3  . rosuvastatin  (CRESTOR ) 5 MG tablet Take 1 tablet (5 mg total) by mouth daily. 90 tablet 1  . tamsulosin (FLOMAX) 0.4 MG CAPS capsule Take 0.4 mg by mouth daily after supper.    . telmisartan  (MICARDIS ) 80 MG tablet Take 1 tablet (80 mg total) by mouth daily. 90 tablet 3   No facility-administered medications prior to visit.  Family History  Problem Relation Age of Onset  . Diabetes Mother   . Hypertension Mother     Social History   Socioeconomic History  . Marital status: Married    Spouse name: Not on file  . Number of children: 4  . Years of education: Not on file  . Highest  education level: Not on file  Occupational History  . Occupation: retired - Advice worker  Tobacco Use  . Smoking status: Former    Current packs/day: 0.00    Average packs/day: 1.5 packs/day for 5.0 years (7.5 ttl pk-yrs)    Types: Cigarettes    Start date: 11/05/1960    Quit date: 11/05/1965    Years since quitting: 58.8  . Smokeless tobacco: Never  Vaping Use  . Vaping status: Never Used  Substance and Sexual Activity  . Alcohol use: No  . Drug use: No  . Sexual activity: Not Currently    Birth control/protection: None  Other Topics Concern  . Not on file  Social History Narrative  . Not on file   Social Drivers of Health   Financial Resource Strain: Not on file  Food Insecurity: Not on file  Transportation Needs: Not on file  Physical Activity: Not on file  Stress: Not on file  Social Connections: Not on file  Intimate Partner Violence: Not on file                                                                                                  Objective:  Physical Exam: BP 126/68 (BP Location: Right Arm, Patient Position: Sitting, Cuff Size: Normal)   Pulse 68   Temp (!) 97 F (36.1 C) (Temporal)   Resp 18   Wt 178 lb 6.4 oz (80.9 kg)   SpO2 98%   BMI 28.79 kg/m   BP Readings from Last 3 Encounters:  08/09/24 126/68  07/27/24 (!) 170/88  07/25/24 (!) 169/84    Physical Exam VITALS: BP- 126/68 GENERAL: Alert, cooperative, well developed, no acute distress HEENT: Normocephalic, normal oropharynx, moist mucous membranes CHEST: Clear to auscultation bilaterally, no wheezes, rhonchi, or crackles CARDIOVASCULAR: Blood pressure 126/68 mmHg, normal heart rate and rhythm, S1 and S2 normal without murmurs ABDOMEN: Soft, non-tender, non-distended, without organomegaly, normal bowel sounds EXTREMITIES: No cyanosis or edema NEUROLOGICAL: Cranial nerves grossly intact, moves all extremities without gross motor or sensory deficit   Physical Exam  VAS US   CAROTID Result Date: 07/10/2024 Carotid Arterial Duplex Study Patient Name:  JULIAN MEDINA [facility]  Date of Exam:   07/08/2024 Medical Rec #: [id number]       Accession #:    [id number] Date of Birth: [birth year]       Patient Gender: M Patient Age:   7 years Exam Location:  [facility] Procedure:      VAS US  CAROTID Referring Phys: CAITLIN WALKER --------------------------------------------------------------------------------  Indications:       Patient denies any cerebrovascular symptoms. He does                    occassionally will get headaches if his blood  pressure is                    really high. Risk Factors:      Hypertension, hyperlipidemia, past history of smoking,                    coronary artery disease. Comparison Study:  Prior carotid duplex exam on 10/14/2023 showed highest                    velocities in right mid ICA 100/25 cm/s and left mid ICA                    87/20 cm/s. Performing Technologist: Annabella Cater RVT, RDCS (AE), RDMS  Examination Guidelines: A complete evaluation includes B-mode imaging, spectral Doppler, color Doppler, and power Doppler as needed of all accessible portions of each vessel. Bilateral testing is considered an integral part of a complete examination. Limited examinations for reoccurring indications may be performed as noted.  Right Carotid Findings: +----------+--------+--------+--------+------------------+------------------+           PSV cm/sEDV cm/sStenosisPlaque DescriptionComments           +----------+--------+--------+--------+------------------+------------------+ CCA Prox  87      9                                                    +----------+--------+--------+--------+------------------+------------------+ CCA Distal56      8                                 intimal thickening +----------+--------+--------+--------+------------------+------------------+ ICA Prox  44      9               focal and calcificintimal thickening  +----------+--------+--------+--------+------------------+------------------+ ICA Mid   59      14      1-39%                                        +----------+--------+--------+--------+------------------+------------------+ ICA Distal43      12                                tortuous           +----------+--------+--------+--------+------------------+------------------+ ECA       124     1                                                    +----------+--------+--------+--------+------------------+------------------+ +----------+--------+-------+----------------+-------------------+           PSV cm/sEDV cmsDescribe        Arm Pressure (mmHG) +----------+--------+-------+----------------+-------------------+ [id number]            Multiphasic, [id number]                 +----------+--------+-------+----------------+-------------------+ +---------+--------+--+--------+--+---------+ VertebralPSV cm/s43EDV cm/s10Antegrade +---------+--------+--+--------+--+---------+  Left Carotid Findings: +----------+--------+--------+--------+--------------------+------------------+           PSV cm/sEDV cm/sStenosisPlaque Description  Comments           +----------+--------+--------+--------+--------------------+------------------+  CCA Prox  42      6                                                      +----------+--------+--------+--------+--------------------+------------------+ CCA Distal66      12              focal and calcific  intimal thickening +----------+--------+--------+--------+--------------------+------------------+ ICA Prox  71      20              diffuse and calcific                   +----------+--------+--------+--------+--------------------+------------------+ ICA Mid   89      30      1-39%                                          +----------+--------+--------+--------+--------------------+------------------+ ICA Distal46      11                                   tortuous           +----------+--------+--------+--------+--------------------+------------------+ ECA       160     2                                                      +----------+--------+--------+--------+--------------------+------------------+ +----------+--------+--------+----------------+-------------------+           PSV cm/sEDV cm/sDescribe        Arm Pressure (mmHG) +----------+--------+--------+----------------+-------------------+ [id number]             Multiphasic, TWO779                 +----------+--------+--------+----------------+-------------------+ +---------+--------+--+--------+--+---------+ VertebralPSV cm/s46EDV cm/s12Antegrade +---------+--------+--+--------+--+---------+    Findings reported to Reche Finder, NP at 3:30 pm regarding elevated blood pressure readings. Summary: Right Carotid: Velocities in the right ICA are consistent with a 1-39% stenosis.                Non-hemodynamically significant plaque <50% noted in the CCA. The                ECA appears <50% stenosed. Left Carotid: Velocities in the left ICA are consistent with a 1-39% stenosis.               Non-hemodynamically significant plaque <50% noted in the CCA. The               ECA appears <50% stenosed. Vertebrals:  Bilateral vertebral arteries demonstrate antegrade flow. Subclavians: Normal flow hemodynamics were seen in bilateral subclavian              arteries. *See table(s) above for measurements and observations. Suggest follow up study in 12 months. Electronically signed by Evalene Lunger MD on 07/10/2024 at 9:37:46 AM.    Final    MR Brain W Wo Contrast Result Date: 06/14/2024 CLINICAL DATA:  Head trauma EXAM: MRI HEAD WITHOUT AND WITH CONTRAST TECHNIQUE: Multiplanar, multiecho pulse sequences of the brain and surrounding  structures were obtained without and with intravenous contrast. CONTRAST:  7.5mL GADAVIST  GADOBUTROL  1 MMOL/ML IV SOLN  COMPARISON:  MRI of the brain dated 11/28/2020 FINDINGS: Brain: No abnormal signal on diffusion weighted images to suggest acute infarction. The susceptibility weighted sequences reveal no evidence of acute or chronic hemorrhage. The ventricles are normal in size and configuration without evidence of hydrocephalus. The pituitary and sella are normal. Insert Vascular: Normal flow voids. Skull and upper cervical spine: Normal marrow signal. Sinuses/Orbits: Negative. Other: None. IMPRESSION: No acute intracranial abnormality. No intracranial mass. No traumatic injury identified. Electronically Signed   By: Clem Savory M.D.   On: 06/14/2024 19:19   MR CERVICAL SPINE W WO CONTRAST Result Date: 06/14/2024 CLINICAL DATA:  Chronic neck pain EXAM: MRI CERVICAL SPINE WITHOUT AND WITH CONTRAST TECHNIQUE: Multiplanar and multiecho pulse sequences of the cervical spine, to include the craniocervical junction and cervicothoracic junction, were obtained without and with intravenous contrast. CONTRAST:  7.5mL GADAVIST  GADOBUTROL  1 MMOL/ML IV SOLN COMPARISON:  None Available. FINDINGS: Alignment: Physiologic. Vertebrae: Degenerative endplate marrow changes at a few levels. No acute fracture is identified. Cord: Normal signal and morphology. Posterior Fossa, vertebral arteries, paraspinal tissues: The visualized portions of the skull base and the posterior fossa are normal. No soft tissue abnormality is identified. Disc levels: C2-C3: The disk is normal in configuration. Mild bilateral facet arthropathy. No uncovertebral joint disease. No neuroforaminal stenosis. No spinal canal stenosis. C3-C4: Disc osteophyte complex. Mild bilateral facet arthropathy. Moderate bilateral uncovertebral joint disease. Moderate bilateral neuroforaminal stenosis. Mild spinal canal stenosis. C4-C5: Disc osteophyte complex. Mild bilateral facet arthropathy. Severe bilateral uncovertebral joint disease. Severe bilateral neuroforaminal stenosis.  Moderate spinal canal stenosis. C5-C6: Disc osteophyte complex. Mild bilateral facet arthropathy. Mild right uncovertebral joint disease. Mild right neuroforaminal stenosis. No spinal canal stenosis. C6-C7: The disk is normal in configuration. Moderate bilateral facet arthropathy. No uncovertebral joint disease. No neuroforaminal stenosis. No spinal canal stenosis. C7-T1: The disk is normal in configuration. Severe bilateral facet arthropathy. No uncovertebral joint disease. No neuroforaminal stenosis. No spinal canal stenosis. IMPRESSION: 1. 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 Severe bilateral foraminal stenosis at this level. 2. Moderate bilateral foraminal stenosis at C3-C4 secondary to uncovertebral joint disease. Electronically Signed   By: Clem Savory M.D.   On: 06/14/2024 19:07   VAS US  AAA DUPLEX Result Date: 06/14/2024 ABDOMINAL AORTA STUDY Patient Name:  [facility]  Date of Exam:   06/14/2024 Medical Rec #: [id number]       Accession #:    [id number] Date of Birth: 10/09/1941       Patient Gender: M Patient Age:   21 years Exam Location:  [address] Procedure:      VAS US  AAA DUPLEX Referring Phys: LONNI GASKINS --------------------------------------------------------------------------------  Indications: Follow up exam for known AAA. Risk Factors: Hypertension, hyperlipidemia, past history of smoking, coronary               artery disease.  Comparison Study: 05/02/2024 Renal artery duplex- distal aorta 3.6 x 3.7cm Performing Technologist: Rosaline Fujisawa MHA, RDMS, RVT, RDCS  Examination Guidelines: A complete evaluation includes B-mode imaging, spectral Doppler, color Doppler, and power Doppler as needed of all accessible portions of each vessel. Bilateral testing is considered an integral part of a complete examination. Limited examinations for reoccurring indications may be performed as noted.  Abdominal Aorta Findings:  +-----------+-------+----------+----------+--------+--------+--------+ Location   AP (cm)Trans (cm)PSV (cm/s)WaveformThrombusComments +-----------+-------+----------+----------+--------+--------+--------+ Proximal   2.23   2.13  91                                 +-----------+-------+----------+----------+--------+--------+--------+ Mid        2.70   2.93      82                                 +-----------+-------+----------+----------+--------+--------+--------+ Distal     3.60   3.76      182                                +-----------+-------+----------+----------+--------+--------+--------+ RT CIA Prox1.2    1.3       222       biphasic                 +-----------+-------+----------+----------+--------+--------+--------+ LT CIA Prox1.3    1.2       163       biphasic                 +-----------+-------+----------+----------+--------+--------+--------+  Summary: Abdominal Aorta: There is evidence of abnormal dilatation of the distal Abdominal aorta. The largest aortic measurement is 3.8 cm. Stenosis: +------------------+---------------+ Location          Stent           +------------------+---------------+ Right Common Iliac50-99% stenosis +------------------+---------------+   *See table(s) above for measurements and observations.  Electronically signed by Lonni Gaskins MD on 06/14/2024 at 10:14:14 AM.    Final     Recent Results (from the past 2160 hours)  Comp Met (CMET)     Status: None   Collection Time: 06/07/24  2:10 PM  Result Value Ref Range   Sodium 139 135 - 145 mEq/L   Potassium 3.9 3.5 - 5.1 mEq/L   Chloride 104 96 - 112 mEq/L   CO2 27 19 - 32 mEq/L   Glucose, Bld 99 70 - 99 mg/dL   BUN 22 6 - 23 mg/dL   Creatinine, Ser 8.96 0.40 - 1.50 mg/dL   Total Bilirubin 0.6 0.2 - 1.2 mg/dL   Alkaline Phosphatase 60 39 - 117 U/L   AST 20 0 - 37 U/L   ALT 17 0 - 53 U/L   Total Protein 6.9 6.0 - 8.3 g/dL   Albumin 4.5 3.5 - 5.2  g/dL   GFR 32.75 >39.99 mL/min    Comment: Calculated using the CKD-EPI Creatinine Equation (2021)   Calcium  9.6 8.4 - 10.5 mg/dL  CBC w/Diff     Status: Abnormal   Collection Time: 06/07/24  2:10 PM  Result Value Ref Range   WBC 6.5 4.0 - 10.5 K/uL   RBC 4.93 4.22 - 5.81 Mil/uL   Hemoglobin 13.9 13.0 - 17.0 g/dL   HCT 58.3 60.9 - 47.9 %   MCV 84.4 78.0 - 100.0 fl   MCHC 33.3 30.0 - 36.0 g/dL   RDW 87.1 88.4 - 84.4 %   Platelets 133.0 (L) 150.0 - 400.0 K/uL   Neutrophils Relative % 63.6 43.0 - 77.0 %   Lymphocytes Relative 22.6 12.0 - 46.0 %   Monocytes Relative 12.2 (H) 3.0 - 12.0 %   Eosinophils Relative 1.0 0.0 - 5.0 %   Basophils Relative 0.6 0.0 - 3.0 %   Neutro Abs 4.1 1.4 - 7.7 K/uL   Lymphs Abs  1.5 0.7 - 4.0 K/uL   Monocytes Absolute 0.8 0.1 - 1.0 K/uL   Eosinophils Absolute 0.1 0.0 - 0.7 K/uL   Basophils Absolute 0.0 0.0 - 0.1 K/uL  Sedimentation rate     Status: None   Collection Time: 06/08/24  4:03 PM  Result Value Ref Range   Sed Rate 8 0 - 20 mm/hr  Catecholamines, fractionated, plasma     Status: Abnormal   Collection Time: 06/09/24  3:36 PM  Result Value Ref Range   Norepinephrine 838 (H) 115 - 524 pg/mL   Epinephrine 26.3 0.0 - 55.4 pg/mL   Dopamine 18.2 0.0 - 36.7 pg/mL    Comment: Catecholamines, Plasma reference intervals based on patient in supine position for at least 20 minutes.   Metanephrines, plasma     Status: None   Collection Time: 06/09/24  3:36 PM  Result Value Ref Range   Normetanephrine, Free 80.1 0.0 - 297.2 pg/mL   Metanephrine, Free <25.0 0.0 - 88.0 pg/mL  TSH     Status: None   Collection Time: 06/09/24  3:36 PM  Result Value Ref Range   TSH 1.790 0.450 - 4.500 uIU/mL  Aldosterone + renin activity w/ ratio     Status: None   Collection Time: 06/09/24  3:36 PM  Result Value Ref Range   Aldosterone 3.4 0.0 - 30.0 ng/dL   Renin Activity, Plasma 0.876 0.167 - 5.380 ng/mL/hr   Aldos/Renin Ratio 3.9 0.0 - 30.0    Comment:                           Units:      ng/dL per ng/mL/hr        Beverley Adine Hummer, MD, MS

## 2024-08-09 NOTE — Patient Instructions (Addendum)
 Please stop (HYGROTON ) 25 MG tablet and start alfuzosin  10MG  daily.

## 2024-08-11 ENCOUNTER — Ambulatory Visit: Payer: Self-pay | Admitting: Family Medicine

## 2024-08-11 DIAGNOSIS — N182 Chronic kidney disease, stage 2 (mild): Secondary | ICD-10-CM

## 2024-08-11 LAB — URINALYSIS W MICROSCOPIC + REFLEX CULTURE
Bacteria, UA: NONE SEEN /HPF
Bilirubin Urine: NEGATIVE
Glucose, UA: NEGATIVE
Hgb urine dipstick: NEGATIVE
Hyaline Cast: NONE SEEN /LPF
Ketones, ur: NEGATIVE
Nitrites, Initial: NEGATIVE
Protein, ur: NEGATIVE
RBC / HPF: NONE SEEN /HPF (ref 0–2)
Specific Gravity, Urine: 1.015 (ref 1.001–1.035)
Squamous Epithelial / HPF: NONE SEEN /HPF (ref <=5)
pH: 6.5 (ref 5.0–8.0)

## 2024-08-11 LAB — URINE CULTURE
MICRO NUMBER:: 17007133
Result:: NO GROWTH
SPECIMEN QUALITY:: ADEQUATE

## 2024-08-11 LAB — CULTURE INDICATED

## 2024-08-11 NOTE — Progress Notes (Signed)
 Complex Care Management Note  Care Guide Note 08/11/2024 Name: Ricardo [name] MRN: [id number] DOB: 1940/11/29  Ricardo [name] is a 83 y.o. year old male who sees Ricardo Beverley NOVAK, MD for primary care. I reached out to Jeet A Grajales by phone today to offer complex care management services.  [name] was given information about Complex Care Management services today including:   The Complex Care Management services include support from the care team which includes your Nurse Care Manager, Clinical Social Worker, or Pharmacist.  The Complex Care Management team is here to help remove barriers to the health concerns and goals most important to you. Complex Care Management services are voluntary, and the patient may decline or stop services at any time by request to their care team member.   Complex Care Management Consent Status: Patient did not agree to participate in complex care management services at this time.  Follow up plan: Patient will follow up with PCP  Encounter Outcome:  Patient Refused  [facility]  [facility], [facility] VBCI Assistant Direct Dial: [phone]  Fax: [phone]

## 2024-08-15 NOTE — Progress Notes (Signed)
 [employer] [facility] Weekly Session  Patient Details  Name: Ricardo [name] MRN: [id number] Date of Birth: [birth year] Age: 83 y.o. PCP: Sebastian Beverley NOVAK, MD  Vitals:   08/15/24 1343  Weight: 175 lb (79.4 kg)     [facility] Weekly seesion - 08/15/24 1300       [facility] PRE[facility] Location   [facility] PRE[facility] Location [facility]      Weekly Session   Topic Discussed Health habits   Talked about ways to make a healthy lifestyle changes a habit, sugar demo   Classes attended to date 5          [facility] 08/15/2024, 1:44 PM

## 2024-08-18 ENCOUNTER — Encounter (HOSPITAL_BASED_OUTPATIENT_CLINIC_OR_DEPARTMENT_OTHER): Payer: Self-pay | Admitting: Family

## 2024-08-18 ENCOUNTER — Ambulatory Visit (HOSPITAL_BASED_OUTPATIENT_CLINIC_OR_DEPARTMENT_OTHER): Admitting: Family

## 2024-08-18 VITALS — BP 160/62 | HR 77 | Ht 66.0 in | Wt 177.3 lb

## 2024-08-18 DIAGNOSIS — I2510 Atherosclerotic heart disease of native coronary artery without angina pectoris: Secondary | ICD-10-CM

## 2024-08-18 DIAGNOSIS — E785 Hyperlipidemia, unspecified: Secondary | ICD-10-CM

## 2024-08-18 DIAGNOSIS — I6523 Occlusion and stenosis of bilateral carotid arteries: Secondary | ICD-10-CM

## 2024-08-18 DIAGNOSIS — I10 Essential (primary) hypertension: Secondary | ICD-10-CM

## 2024-08-18 MED ORDER — HYDRALAZINE HCL 25 MG PO TABS: 25.0000 mg | ORAL_TABLET | Freq: Two times a day (BID) | ORAL | 3 refills | Status: DC

## 2024-08-18 MED ORDER — NITROGLYCERIN 0.4 MG SL SUBL: 0.4000 mg | SUBLINGUAL_TABLET | SUBLINGUAL | 3 refills | Status: DC | PRN

## 2024-08-18 NOTE — Progress Notes (Signed)
 [facility]pertension [facility] Assessment:    Date:  08/18/2024   ID:  Ricardo [name], DOB 1941/08/12, MRN [id number]  PCP:  Sebastian Beverley NOVAK, MD  Cardiologist:  Darryle ONEIDA Decent, MD  Nephrologist:  Referring MD: Sebastian Beverley NOVAK, MD   CC: Hypertension  History of Present Illness:     Discussed the use of AI scribe software for clinical note transcription with the patient, who gave verbal consent to proceed.  History of Present Illness Ricardo [name] is an 83 year old male with CAD, HLD, HTN, AAA, orthostatic hypotension, carotid artery disease, hypertension who presents for evaluation of uncontrolled blood pressure.  Prior RCA stent 2001. Admitted in 06/2020 for unstable angina. Cath 07/09/20 showed mild-moderate multivessel disease managed medically. Admitted in 04/2021 with chest pain, hypertensive emergency and bradycardia. Echo EF 60-65%, no RWMA, mild LVH, grade I DD, normal RV systolic function, no significant valvular abnormalities. Monitor in 07/2021 showed no significant arrhythmias. Nuclear stress test 07/2021 no ischemia or infarction.    History of AAA. Ultrasound from 03/2022 showed evidence of abnormal dilatation of the distal abdominal aorta, largest measurement 3.6 cm. Carotid dopplers 09/2023 revealed 1-39% stenosis in the right ICA, near-normal Left ICA.    Prior issues with dizziness and hypotension, clonidine  weaned. Hydralazine  added PRN. Renal artery ultrasound 04/2024 that showed 1-59% stenosis in bilateral renal arteries   Established with [facility] 06/09/24. He had been managing hypertension since his mid-fifties, requiring multiple medications. He was referred to PREP exercise program. BP was labile at home. For BP control Imdur  at 15mg  at bedtime and 30mg  AM. Later adjusted to 60mg  BID. Chlorthalidone , hydralazine  PRN, telmisartan  continued. Labs negative for hyperaldosteronism, normal TSH. Catecholamines were mildly elevated - 24h urine ordered but  not yet performed. On 07/08/24 advised to start Hydralazine  25mg  daily at lunch, he has not done so. Carotid duplex 07/11/24 with mild bilateral 1-39% stenosis.   Ricardo [name] is an 83 year old male with hypertension who presents with fluctuating blood pressure and medication management issues.   He experiences fluctuating blood pressure readings, with higher readings more frequently in the mornings. BP on review at home 130-190/60-70s. Episodes of lightheadedness and dizziness occur, particularly after starting alfuzosin , with an initial severe reaction to the medication. This has improved and now tolerating without issue. Since discontinuation of Chlorthalidone , urinary frequency and nocturia resolved. Current medications include alfuzosin  in the morning, telmisartan  in the morning, and isosorbide  60 mg twice daily. Hydralazine  is used as needed, approximately every other day, for elevated blood pressure.   A recent blood test showed abnormal catecholamine levels. A 24-hour urine collection has not yet been completed to further investigate this finding.  He attends PREP exercise classes at the [facility] and has participated in three or four classes, finding them beneficial.He consumes water with electrolytes from [employer], which may contain sodium, potentially affecting his blood pressure. He uses honey in his tea, which may contribute to elevated glucose levels, as indicated by a recent blood panel A1c 6.2.   Previous antihypertensives: Clonidine  - dry mouth, lightheadedness, drowsiness Amlodipine (gingival hyperplasia)   Past Medical History:  Diagnosis Date   AAA (abdominal aortic aneurysm)    Abdominal aneurysm 08/30/2018   Arthritis    SHOULDERS, NECK , RIGHT HIP   Bladder cancer (HCC) DX   OCT 2011--  FOLLOWED BY DR WOODRUFF   S/P TURBT,  HIGH GRADE BLADDER CANCER   CAD (coronary artery disease), native coronary artery 08/30/2018  normal Left main, 30% stenosis ostial LAD, 50%  stenosis OM 3, 80% stenosis mid RCA;  2.5 x 8 mm Tetra Stent Dr. Claudene   Coronary artery disease CARDIOLOGIST- DR BLANCA-  LAST VISIT 2 WKS AGO-- WILL REQUEST NOTE AND EKG   STRESS TEST -- JULY 2009   Essential hypertension 08/30/2018   Hyperlipidemia    Hypertension    Nocturia    Personal history of malignant neoplasm of bladder 08/30/2018   Post PTCA 2001-   X1 STENT TO RCA   AND 1996    Past Surgical History:  Procedure Laterality Date   CORONARY ANGIOPLASTY  1996   CORONARY ANGIOPLASTY WITH STENT PLACEMENT  2001   STENT TO RCA   CYSTO/ BLADDER BX  10-22-10   CYSTOSCOPY W/ RETROGRADES  11/12/2011   Procedure: CYSTOSCOPY WITH RETROGRADE PYELOGRAM;  Surgeon: Toribio Neysa Repine, MD;  Location: [facility];  Service: Urology;  Laterality: Bilateral;   LEFT HEART CATH AND CORONARY ANGIOGRAPHY N/A 07/09/2020   Procedure: LEFT HEART CATH AND CORONARY ANGIOGRAPHY;  Surgeon: Claudene Pacific, MD;  Location: [facility] IN[facility];  Service: Cardiovascular;  Laterality: N/A;   LUMBAR FUSION     TRANSURETHRAL RESECTION OF BLADDER TUMOR  08-27-10   RIGHT ANTERIOR DOME   TRANSURETHRAL RESECTION OF BLADDER TUMOR  11/12/2011   Procedure: TRANSURETHRAL RESECTION OF BLADDER TUMOR (TURBT);  Surgeon: Toribio Neysa Repine, MD;  Location: [facility];  Service: Urology;  Laterality: N/A;  with PK Gyrus c-arm gyrus  digital ureteroscope    Current Medications: Current Meds  Medication Sig   acetaminophen  (TYLENOL ) 500 MG tablet Take 1,000 mg by mouth daily as needed for headache (pain).   alfuzosin  (UROXATRAL ) 10 MG 24 hr tablet Take 1 tablet (10 mg total) by mouth daily with breakfast.   aspirin  81 MG tablet Take 1 tablet (81 mg total) by mouth daily.   Evolocumab  (REPATHA  SURECLICK) 140 MG/ML SOAJ Inject 140 mg into the skin See admin instructions. Inject 140 mg subcutaneously on the 1st and 15th of each month   hydrALAZINE  (APRESOLINE ) 25 MG tablet Take 1 tablet  (25 mg total) by mouth 3 (three) times daily. Ok to take 1 tablet as need for blood pressure greater than 160 systolic   isosorbide  mononitrate (IMDUR ) 60 MG 24 hr tablet Take 1 tablet (60 mg total) by mouth in the morning and at bedtime.   MYRBETRIQ 25 MG TB24 tablet Take 25 mg by mouth daily.   nitroGLYCERIN  (NITROSTAT ) 0.4 MG SL tablet Place 1 tablet (0.4 mg total) under the tongue every 5 (five) minutes as needed.   rosuvastatin  (CRESTOR ) 5 MG tablet Take 1 tablet (5 mg total) by mouth daily.   tamsulosin (FLOMAX) 0.4 MG CAPS capsule Take 0.4 mg by mouth daily after supper.   telmisartan  (MICARDIS ) 80 MG tablet Take 1 tablet (80 mg total) by mouth daily.   [DISCONTINUED] chlorthalidone  (HYGROTON ) 25 MG tablet Take 0.5 tablets (12.5 mg total) by mouth daily.     Allergies:   Clonidine  hcl, Cyclobenzaprine, and Norvasc [amlodipine besylate]   Social History   Socioeconomic History   Marital status: Married    Spouse name: Not on file   Number of children: 4   Years of education: Not on file   Highest education level: Not on file  Occupational History   Occupation: retired - Advice worker  Tobacco Use   Smoking status: Former    Current packs/day: 0.00    Average packs/day:  1.5 packs/day for 5.0 years (7.5 ttl pk-yrs)    Types: Cigarettes    Start date: 11/05/1960    Quit date: 11/05/1965    Years since quitting: 58.8   Smokeless tobacco: Never  Vaping Use   Vaping status: Never Used  Substance and Sexual Activity   Alcohol use: No   Drug use: No   Sexual activity: Not Currently    Birth control/protection: None  Other Topics Concern   Not on file  Social History Narrative   Not on file   Social Drivers of Health   Financial Resource Strain: Not on file  Food Insecurity: Not on file  Transportation Needs: Not on file  Physical Activity: Not on file  Stress: Not on file  Social Connections: Not on file     Family History: The patient's family history includes  Diabetes in his mother; Hypertension in his mother.  ROS:   Please see the history of present illness.     All other systems reviewed and are negative.  EKGs/Labs/Other Studies Reviewed:         Recent Labs: 09/28/2023: Magnesium 2.3 08/09/2024: ALT 14; BUN 27; Creatinine, Ser 1.03; Hemoglobin 13.9; Platelets 131.0; Potassium 4.0; Sodium 141; TSH 1.70   Recent Lipid Panel    Component Value Date/Time   CHOL 125 08/09/2024 1016   CHOL 100 02/18/2021 1011   TRIG 123.0 08/09/2024 1016   HDL 45.70 08/09/2024 1016   HDL 50 02/18/2021 1011   CHOLHDL 3 08/09/2024 1016   VLDL 24.6 08/09/2024 1016   LDLCALC 55 08/09/2024 1016   LDLCALC 34 02/18/2021 1011    Physical Exam:   VS:  BP (!) 160/62 (BP Location: Left Arm, Patient Position: Sitting, Cuff Size: Normal)   Pulse 77   Ht 5' 6 (1.676 m)   Wt 177 lb 4.8 oz (80.4 kg)   SpO2 93%   BMI 28.62 kg/m  , BMI Body mass index is 28.62 kg/m. GENERAL:  Well appearing HEENT: Pupils equal round and reactive, fundi not visualized, oral mucosa unremarkable NECK:  No jugular venous distention, waveform within normal limits, carotid upstroke brisk and symmetric, no bruits, no thyromegaly LYMPHATICS:  No cervical adenopathy LUNGS:  Clear to auscultation bilaterally HEART:  RRR.  PMI not displaced or sustained,S1 and S2 within normal limits, no S3, no S4, no clicks, no rubs, no murmurs ABD:  Flat, positive bowel sounds normal in frequency in pitch, no bruits, no rebound, no guarding, no midline pulsatile mass, no hepatomegaly, no splenomegaly EXT:  2 plus pulses throughout, no edema, no cyanosis no clubbing SKIN:  No rashes no nodules NEURO:  Cranial nerves II through XII grossly intact, motor grossly intact throughout PSYCH:  Cognitively intact, oriented to person place and time   ASSESSMENT/PLAN:    Assessment & Plan Labile hypertension Blood pressure fluctuations with recent higher readings. Abnormal catecholamines suggest possible  pheochromocytoma - Order 24-hour urine catecholamine collection. - Prescribe hydralazine  25 mg BID, additional 25 mg PRN for SBP >170 -continue imdur  60mg  BID, continue Telmisartan  80mg  daily - Remain off chlorthalidone  as discontinued  by PCP for nocturia, frequency. -Advised to avoid electrolyte drinks containing sodium -Continue participation in PREP program at [facility]  CAD / HLD, LDL goal <70 Stable with no anginal symptoms. No indication for ischemic evaluation.  GDMT Repatha  140mg  q14 days,  isosorbide  60mg  daily. No BB due to hx of bradycardia.  -Recommend aiming for 150 minutes of moderate intensity activity per week and following a heart healthy  diet.    Prediabetes A1c is 6.2. - Advise reducing honey intake in tea. - Encourage exercise at the [facility]. -Continue to follow with PCP.   Bilateral mild carotid stenosis -by duplex 07/08/24. Repeat duplex 1 year already ordered. Continue lipid management, as above.    Screening for Secondary Hypertension:     Relevant Labs/Studies:    Latest Ref Rng & Units 08/09/2024   10:16 AM 06/07/2024    2:10 PM 02/08/2024   11:42 AM  Basic Labs  Sodium 135 - 145 mEq/L 141  139  143   Potassium 3.5 - 5.1 mEq/L 4.0  3.9  4.4   Creatinine 0.40 - 1.50 mg/dL 8.96  8.96  8.81        Latest Ref Rng & Units 08/09/2024   10:16 AM 06/09/2024    3:36 PM  Thyroid    TSH 0.35 - 5.50 uIU/mL 1.70  1.790        Latest Ref Rng & Units 06/09/2024    3:36 PM  Renin/Aldosterone   Aldosterone 0.0 - 30.0 ng/dL 3.4   Aldos/Renin Ratio 0.0 - 30.0 3.9        Latest Ref Rng & Units 06/09/2024    3:36 PM  Metanephrines/Catecholamines   Epinephrine 0.0 - 55.4 pg/mL 26.3   Norepinephrine 115 - 524 pg/mL 838   Dopamine 0.0 - 36.7 pg/mL 18.2   Metanephrines 0.0 - 88.0 pg/mL <25.0   Normetanephrines  0.0 - 297.2 pg/mL 80.1           05/02/2024   11:15 AM  Renovascular   Renal Artery US  Completed Yes     Disposition:    FU with MD/APP/PharmD in 2-3 months     Medication Adjustments/Labs and Tests Ordered: Current medicines are reviewed at length with the patient today.  Concerns regarding medicines are outlined above.  No orders of the defined types were placed in this encounter.  Meds ordered this encounter  Medications   nitroGLYCERIN  (NITROSTAT ) 0.4 MG SL tablet    Sig: Place 1 tablet (0.4 mg total) under the tongue every 5 (five) minutes as needed.    Dispense:  25 tablet    Refill:  3     Signed, Reche GORMAN Finder, NP  08/18/2024 11:57 AM    [facility]

## 2024-08-18 NOTE — Patient Instructions (Signed)
 Medication Instructions:   CHANGE--Hydralazine  to 25 mg by mouth twice daily.  Ok to take 1 tablet as needed for blood pressure greater than 170 systolic     Labwork:  TODAY--24 HOUR URINE FRACTIONATED CATECHOLAMINES --PLEASE REPORT TO 3RD FLOOR [facility] TO COLLECT YOUR JUG      Follow-Up: Please follow up in 2-3_months in [facility]N [facility]IN[facility] with Dr. Raford, Reche Finder, NP or Allean Mink PharmD      Special Instructions:   24 HOUR URINE COLLECTION INSTRUCTIONS   24-Hour Urine Collection Why am I having this test? A 24-hour urine specimen is a lab test that requires you to collect all of your urine for an entire day. This is sometimes called a timed urine test. It can provide more information than a single urine sample. There are many reasons to have this test. Your health care provider may order the test to check for or monitor the following conditions: High blood pressure. Kidney disease. Kidney stones. Urinary tract infections. Pregnancy. Diabetes. How do I prepare for this test? You may be asked to follow a special diet during or before the collection period. Follow any instructions from your health care provider. If no special instructions are given, you may eat and drink normally. Take over-the-counter and prescription medicines only as told by your health care provider. Let your health care provider know about any medicines that you are taking, including over-the-counter medicines, vitamins, herbs, and supplements. Choose a collection day when you can be at home or when you have a place to store the urine. All urine must be collected during the testing period. How do I do a 24-hour urine collection?  When you get up in the morning, urinate in the toilet and flush. Write down the time. This will be your start time on the day of collection and your end time on the next morning. From the start time on, all of your urine should be kept in the collection jug that you  received from the lab. If the jug that is given to you already has liquid in it, that is okay. Do not throw out the liquid or rinse out the jug. Urinate into a specimen container, such as a urinal or pan that sits over the toilet. Pour the urine from the container into the collection jug. Be careful not to spill any of the urine. Use the equipment provided by the lab. Do not let any toilet paper or stool (feces) get into the jug. This will contaminate the sample. Stop collecting your urine 24 hours after you started. Collect the last specimen as close as possible to the end of the 24-hour period. Keep the jug cool in an ice chest or keep it in the refrigerator during collection. When the 24-hour collection is complete, take the jug to the lab as soon as possible. Keep the jug cool in an ice chest while you are bringing it to the lab. What do the results mean? Talk with your health care provider about what your results mean. Questions to ask your health care provider Ask your health care provider, or the department that is doing the test: When will my results be ready? How will I get my results? What are my treatment options? What other tests do I need? What are my next steps? Summary A 24-hour urine specimen is a lab test that requires you to collect all of your urine for an entire day. When you get up in the morning, urinate in the  toilet and flush. Write down the time. For the next 24 hours, collect all of your urine in the collection jug that you received from the lab. Keep the jug cool while collecting the urine and while bringing it back to the lab. Take the jug of urine back to the lab as soon as possible after the collection period has ended. This information is not intended to replace advice given to you by your health care provider. Make sure you discuss any questions you have with your health care provider. Document Revised: 05/10/2021 Document Reviewed: 05/10/2021 Elsevier Patient  Education  2024 [employer].

## 2024-08-22 NOTE — Progress Notes (Signed)
 [employer] PRE[facility] Weekly Session  Patient Details  Name: Ricardo [name] MRN: [id number] Date of Birth: 10/13/1941 Age: 83 y.o. PCP: Sebastian Beverley NOVAK, MD  Vitals:   08/22/24 1413  Weight: 179 lb (81.2 kg)     [facility] Weekly seesion - 08/22/24 1400       [facility] PREP Location   [facility] PREP Location [facility]      Weekly Session   Topic Discussed Restaurant Eating   Restaurant tips and advice, nutrition label, salt demo   Minutes exercised this week 60 minutes    Classes attended to date 7          [facility] 08/22/2024, 2:14 PM

## 2024-08-23 ENCOUNTER — Ambulatory Visit (INDEPENDENT_AMBULATORY_CARE_PROVIDER_SITE_OTHER): Admitting: Family Medicine

## 2024-08-23 VITALS — BP 152/78 | HR 66 | Temp 97.2°F | Ht 66.0 in | Wt 182.8 lb

## 2024-08-23 DIAGNOSIS — N182 Chronic kidney disease, stage 2 (mild): Secondary | ICD-10-CM | POA: Diagnosis not present

## 2024-08-23 DIAGNOSIS — I10 Essential (primary) hypertension: Secondary | ICD-10-CM

## 2024-08-23 DIAGNOSIS — R3914 Feeling of incomplete bladder emptying: Secondary | ICD-10-CM | POA: Diagnosis not present

## 2024-08-23 DIAGNOSIS — D696 Thrombocytopenia, unspecified: Secondary | ICD-10-CM

## 2024-08-23 DIAGNOSIS — R7303 Prediabetes: Secondary | ICD-10-CM | POA: Diagnosis not present

## 2024-08-23 DIAGNOSIS — N401 Enlarged prostate with lower urinary tract symptoms: Secondary | ICD-10-CM | POA: Diagnosis not present

## 2024-08-23 MED ORDER — ALFUZOSIN HCL ER 10 MG PO TB24: 10.0000 mg | ORAL_TABLET | Freq: Every day | ORAL | 3 refills | Status: DC

## 2024-08-23 NOTE — Progress Notes (Signed)
 Assessment & Plan   Assessment/Plan:   Assessment & Plan Resistant Hypertension Blood pressure remains labile and elevated, with readings as high as 195/84 at home. Current regimen includes telmisartan , hydralazine , and isosorbide . Hydralazine  was discontinued due to significant hypotension. Blood pressure management is challenging due to side effects and fluctuating readings. Cardiologist is involved in management, and further testing for pheochromocytoma is pending. He is not on a beta blocker, and there is a concern about adding more medications due to potential side effects. - Continue telmisartan  and isosorbide . - Discontinue hydralazine  due to hypotension. - Complete 24-hour urine catecholamine test to rule out pheochromocytoma. - Follow up with cardiology for further management.  Proteinuria Mild proteinuria likely secondary to resistant hypertension. Telmisartan  provides renal protection. The proteinuria is not severe, and the best management is controlling blood pressure. - Continue telmisartan  for renal protection. - Monitor proteinuria and kidney function.  Benign Prostatic Hyperplasia with lower urinary tract symptoms BPH with elevated PSA, consistent with enlarged prostate. Urinary symptoms improved after discontinuation of chlorthalidone . Follow-up with urology is in place. - Continue current management and follow up with urology.  Prediabetes A1c in prediabetic range. Lifestyle modifications recommended to prevent progression to diabetes. Exercise and dietary changes are advised to manage blood sugar levels. - Recommend lifestyle modifications including reduced carbohydrate intake and increased exercise.  Thrombocytopenia Thrombocytopenia not severe enough to cause bleeding issues. No prior hematology evaluation. Platelet count is low but within an effective range for clotting. - Consider referral to hematology for evaluation of thrombocytopenia.      Medications  Discontinued During This Encounter  Medication Reason   tamsulosin (FLOMAX) 0.4 MG CAPS capsule    alfuzosin  (UROXATRAL ) 10 MG 24 hr tablet     Return in about 3 months (around 11/23/2024) for BP, HLD.        Subjective:   Encounter date: 08/23/2024  Ricardo [name] is a 83 y.o. male who has CAD (coronary artery disease), native coronary artery; Hyperlipidemia; Essential hypertension; Abdominal aneurysm; Personal history of malignant neoplasm of bladder; Bradycardia; Chest pain at rest; Abdominal aortic aneurysm (AAA) without rupture; Unstable angina (HCC); Sinus bradycardia; Hypertensive emergency; Pain in finger of right hand; Nasal valve collapse; Muscle tension dysphonia; Deviated nasal septum; Acquired trigger finger of right ring finger; Resistant hypertension; Amaurosis fugax; Cervical disc disease; Chronic post-traumatic headache, not intractable; Benign prostatic hyperplasia with lower urinary tract symptoms; Bacterial sinusitis; Chronic pain; Erectile dysfunction; Gastroesophageal reflux disease without esophagitis; Hematuria; History of primary malignant neoplasm of urinary bladder; Impaired fasting glucose; Malnutrition; Presence of coronary angioplasty implant and graft; Microalbuminuria; Chronic kidney disease due to hypertension; CKD stage G2/A2, 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   He  has a past medical history of AAA (abdominal aortic aneurysm), Abdominal aneurysm (08/30/2018), Arthritis, Bladder cancer (HCC) (DX   OCT 2011--  FOLLOWED BY DR ELIZA), CAD (coronary artery disease), native coronary artery (08/30/2018), Coronary artery disease (CARDIOLOGIST- DR BLANCA-  LAST VISIT 2 WKS AGO-- WILL REQUEST NOTE AND EKG), Essential hypertension (08/30/2018), Hyperlipidemia, Hypertension, Nocturia, Personal history of malignant neoplasm of bladder (08/30/2018), and Post PTCA (2001-   X1  STENT TO RCA).SABRA   He presents with chief complaint of Hypertension (Pt presents today for hypertension. Pt would like to discuss his labs as well. ) .   Discussed the use of AI scribe software for clinical note transcription with the patient, who gave verbal consent to proceed.  History of Present Illness 08/09/2024  Ricardo [name] is an 83 year old male with resistant hypertension who presents for a follow-up on blood pressure management. He is accompanied by his grandmother.  Blood pressure fluctuations and antihypertensive therapy - Resistant hypertension with current regimen: hydralazine  25 mg three times daily, isosorbide  60 mg twice daily, telmisartan  80 mg daily, chlorthalidone  12.5 mg daily (added at last visit) - Previous blood pressure at last visit: 170/88 mmHg - Current blood pressure: 126/68 mmHg - Blood pressure fluctuates between 121 and 197 mmHg within short periods - History of use of multiple antihypertensive agents, including prior use of clonidine   Diuretic-induced lower urinary tract symptoms - Chlorthalidone  causes increased urinary urgency - Describes urgency as, 'when I got to go, I got to go.'  Benign prostatic hyperplasia and lower urinary tract symptoms - On tamsulosin 0.4 mg daily for lower urinary tract symptoms - No prior use of alfuzosin   Hyperlipidemia management and medication intolerance - On Repatha  140 mg injection every two weeks and aspirin  81 mg daily - Prior use of atorvastatin  resulted in dental issues  Abdominal aortic aneurysm surveillance - History of abdominal aortic aneurysm without rupture - On medication for aneurysm stability  Renal artery evaluation - Underwent renal artery duplex evaluation  08/23/2024  Ricardo [name] is an 83 year old male with resistant hypertension and benign prostatic hyperplasia who presents for follow-up on blood pressure management.  Blood pressure variability and antihypertensive therapy - Resistant  hypertension with labile blood pressure readings at home, ranging from 136/56 to 195/84 mmHg - Blood pressure decreases after taking morning medications - Current antihypertensive regimen: telmisartan  80 mg daily, hydralazine  25 mg TID (recently stopped due to dizziness), isosorbide  60 mg BID - No significant side effects from current medications - Previously on chlorthalidone , discontinued due to polyuria and urinary urgency  Lower urinary tract symptoms and benign prostatic hyperplasia - History of benign prostatic hyperplasia - Urinary symptoms improved since discontinuation of chlorthalidone  - Recent urology follow-up in November or December of last year - Mildly elevated PSA on recent laboratory evaluation - Mild proteinuria on recent laboratory evaluation  Dizziness - Dizziness occurred while taking hydralazine , leading to discontinuation of the medication  Glucose intolerance - History of prediabetes - Attending a prediabetes class at [facility]  Lipid and thyroid  status - Cholesterol levels within target range - Normal thyroid  function  Hematologic findings - Stable but mildly decreased platelet count - No hematology evaluation to date     ROS  Past Surgical History:  Procedure Laterality Date   CORONARY ANGIOPLASTY  1996   CORONARY ANGIOPLASTY WITH STENT PLACEMENT  2001   STENT TO RCA   CYSTO/ BLADDER BX  10-22-10   CYSTOSCOPY W/ RETROGRADES  11/12/2011   Procedure: CYSTOSCOPY WITH RETROGRADE PYELOGRAM;  Surgeon: Toribio Neysa Repine, MD;  Location: [facility];  Service: Urology;  Laterality: Bilateral;   LEFT HEART CATH AND CORONARY ANGIOGRAPHY N/A 07/09/2020   Procedure: LEFT HEART CATH AND CORONARY ANGIOGRAPHY;  Surgeon: Claudene Pacific, MD;  Location: [facility];  Service: Cardiovascular;  Laterality: N/A;   LUMBAR FUSION     TRANSURETHRAL RESECTION OF BLADDER TUMOR  08-27-10   RIGHT ANTERIOR DOME   TRANSURETHRAL RESECTION OF BLADDER  TUMOR  11/12/2011   Procedure: TRANSURETHRAL RESECTION OF BLADDER TUMOR (TURBT);  Surgeon: Toribio Neysa Repine, MD;  Location[facility];  Service: Urology;  Laterality: N/A;  with PK Gyrus c-arm gyrus  digital ureteroscope  Outpatient Medications Prior to Visit  Medication Sig Dispense Refill   acetaminophen  (TYLENOL ) 500 MG tablet Take 1,000 mg by mouth daily as needed for headache (pain).     aspirin  81 MG tablet Take 1 tablet (81 mg total) by mouth daily. 1 tablet 0   Evolocumab  (REPATHA  SURECLICK) 140 MG/ML SOAJ Inject 140 mg into the skin See admin instructions. Inject 140 mg subcutaneously on the 1st and 15th of each month 2 mL 11   hydrALAZINE  (APRESOLINE ) 25 MG tablet Take 1 tablet (25 mg total) by mouth 2 (two) times daily. Ok to take 1 tablet as needed for blood pressure greater than 170 systolic. 195 tablet 3   isosorbide  mononitrate (IMDUR ) 60 MG 24 hr tablet Take 1 tablet (60 mg total) by mouth in the morning and at bedtime. 180 tablet 3   MYRBETRIQ 25 MG TB24 tablet Take 25 mg by mouth daily.     nitroGLYCERIN  (NITROSTAT ) 0.4 MG SL tablet Place 1 tablet (0.4 mg total) under the tongue every 5 (five) minutes as needed. 25 tablet 3   rosuvastatin  (CRESTOR ) 5 MG tablet Take 1 tablet (5 mg total) by mouth daily. 90 tablet 1   telmisartan  (MICARDIS ) 80 MG tablet Take 1 tablet (80 mg total) by mouth daily. 90 tablet 3   alfuzosin  (UROXATRAL ) 10 MG 24 hr tablet Take 1 tablet (10 mg total) by mouth daily with breakfast. 90 tablet 1   tamsulosin (FLOMAX) 0.4 MG CAPS capsule Take 0.4 mg by mouth daily after supper.     No facility-administered medications prior to visit.    Family History  Problem Relation Age of Onset   Diabetes Mother    Hypertension Mother     Social History   Socioeconomic History   Marital status: Married    Spouse name: Not on file   Number of children: 4   Years of education: Not on file   Highest education level: Not on file   Occupational History   Occupation: retired - Advice worker  Tobacco Use   Smoking status: Former    Current packs/day: 0.00    Average packs/day: 1.5 packs/day for 5.0 years (7.5 ttl pk-yrs)    Types: Cigarettes    Start date: 11/05/1960    Quit date: 11/05/1965    Years since quitting: 58.8   Smokeless tobacco: Never  Vaping Use   Vaping status: Never Used  Substance and Sexual Activity   Alcohol use: No   Drug use: No   Sexual activity: Not Currently    Birth control/protection: None  Other Topics Concern   Not on file  Social History Narrative   Not on file   Social Drivers of Health   Financial Resource Strain: Not on file  Food Insecurity: Not on file  Transportation Needs: Not on file  Physical Activity: Not on file  Stress: Not on file  Social Connections: Not on file  Intimate Partner Violence: Not on file  Objective:  Physical Exam: BP (!) 152/78 (BP Location: Left Arm, Patient Position: Sitting, Cuff Size: Normal)   Pulse 66   Temp (!) 97.2 F (36.2 C) (Temporal)   Ht 5' 6 (1.676 m)   Wt 182 lb 12.8 oz (82.9 kg)   SpO2 96%   BMI 29.50 kg/m   BP Readings from Last 3 Encounters:  08/23/24 (!) 152/78  08/18/24 (!) 160/62  08/09/24 126/68    Physical Exam GENERAL: Alert, cooperative, well developed, no acute distress. HEENT: Normocephalic, normal oropharynx, moist mucous membranes. CHEST: Clear to auscultation bilaterally, no wheezes, rhonchi, or crackles. CARDIOVASCULAR: Normal heart rate and rhythm, S1 and S2 normal without murmurs. ABDOMEN: Soft, non-tender, non-distended, without organomegaly, normal bowel sounds. EXTREMITIES: No cyanosis or edema. NEUROLOGICAL: Cranial nerves grossly intact, moves all extremities without gross motor or sensory deficit.   Physical Exam  VAS US  CAROTID Result Date: 07/10/2024 Carotid Arterial Duplex Study  Patient Name:  Jimy A [facility]  Date of Exam:   07/08/2024 Medical Rec #: [id number]       Accession #:    [id number] Date of Birth: 1941/04/20       Patient Gender: M Patient Age:   72 years Exam Location:  [facility] Procedure:      VAS US  CAROTID Referring Phys: CAITLIN WALKER --------------------------------------------------------------------------------  Indications:       Patient denies any cerebrovascular symptoms. He does                    occassionally will get headaches if his blood pressure is                    really high. Risk Factors:      Hypertension, hyperlipidemia, past history of smoking,                    coronary artery disease. Comparison Study:  Prior carotid duplex exam on 10/14/2023 showed highest                    velocities in right mid ICA 100/25 cm/s and left mid ICA                    87/20 cm/s. Performing Technologist: Annabella Cater RVT, RDCS (AE), RDMS  Examination Guidelines: A complete evaluation includes B-mode imaging, spectral Doppler, color Doppler, and power Doppler as needed of all accessible portions of each vessel. Bilateral testing is considered an integral part of a complete examination. Limited examinations for reoccurring indications may be performed as noted.  Right Carotid Findings: +----------+--------+--------+--------+------------------+------------------+           PSV cm/sEDV cm/sStenosisPlaque DescriptionComments           +----------+--------+--------+--------+------------------+------------------+ CCA Prox  87      9                                                    +----------+--------+--------+--------+------------------+------------------+ CCA Distal56      8                                 intimal thickening +----------+--------+--------+--------+------------------+------------------+ ICA Prox  44      9  focal and calcificintimal thickening  +----------+--------+--------+--------+------------------+------------------+ ICA Mid   59      14      1-39%                                        +----------+--------+--------+--------+------------------+------------------+ ICA Distal43      12                                tortuous           +----------+--------+--------+--------+------------------+------------------+ ECA       124     1                                                    +----------+--------+--------+--------+------------------+------------------+ +----------+--------+-------+----------------+-------------------+           PSV cm/sEDV cmsDescribe        Arm Pressure (mmHG) +----------+--------+-------+----------------+-------------------+ [id number]            Multiphasic, [id number]                 +----------+--------+-------+----------------+-------------------+ +---------+--------+--+--------+--+---------+ VertebralPSV cm/s43EDV cm/s10Antegrade +---------+--------+--+--------+--+---------+  Left Carotid Findings: +----------+--------+--------+--------+--------------------+------------------+           PSV cm/sEDV cm/sStenosisPlaque Description  Comments           +----------+--------+--------+--------+--------------------+------------------+ CCA Prox  42      6                                                      +----------+--------+--------+--------+--------------------+------------------+ CCA Distal66      12              focal and calcific  intimal thickening +----------+--------+--------+--------+--------------------+------------------+ ICA Prox  71      20              diffuse and calcific                   +----------+--------+--------+--------+--------------------+------------------+ ICA Mid   89      30      1-39%                                          +----------+--------+--------+--------+--------------------+------------------+ ICA Distal46      11                                   tortuous           +----------+--------+--------+--------+--------------------+------------------+ ECA       160     2                                                      +----------+--------+--------+--------+--------------------+------------------+ +----------+--------+--------+----------------+-------------------+  PSV cm/sEDV cm/sDescribe        Arm Pressure (mmHG) +----------+--------+--------+----------------+-------------------+ [id number]             Multiphasic, [id number]                 +----------+--------+--------+----------------+-------------------+ +---------+--------+--+--------+--+---------+ VertebralPSV cm/s46EDV cm/s12Antegrade +---------+--------+--+--------+--+---------+    Findings reported to Reche Finder, NP at 3:30 pm regarding elevated blood pressure readings. Summary: Right Carotid: Velocities in the right ICA are consistent with a 1-39% stenosis.                Non-hemodynamically significant plaque <50% noted in the CCA. The                ECA appears <50% stenosed. Left Carotid: Velocities in the left ICA are consistent with a 1-39% stenosis.               Non-hemodynamically significant plaque <50% noted in the CCA. The               ECA appears <50% stenosed. Vertebrals:  Bilateral vertebral arteries demonstrate antegrade flow. Subclavians: Normal flow hemodynamics were seen in bilateral subclavian              arteries. *See table(s) above for measurements and observations. Suggest follow up study in 12 months. Electronically signed by Evalene Lunger MD on 07/10/2024 at 9:37:46 AM.    Final    MR Brain W Wo Contrast Result Date: 06/14/2024 CLINICAL DATA:  Head trauma EXAM: MRI HEAD WITHOUT AND WITH CONTRAST TECHNIQUE: Multiplanar, multiecho pulse sequences of the brain and surrounding structures were obtained without and with intravenous contrast. CONTRAST:  7.5mL GADAVIST  GADOBUTROL  1 MMOL/ML IV SOLN  COMPARISON:  MRI of the brain dated 11/28/2020 FINDINGS: Brain: No abnormal signal on diffusion weighted images to suggest acute infarction. The susceptibility weighted sequences reveal no evidence of acute or chronic hemorrhage. The ventricles are normal in size and configuration without evidence of hydrocephalus. The pituitary and sella are normal. Insert Vascular: Normal flow voids. Skull and upper cervical spine: Normal marrow signal. Sinuses/Orbits: Negative. Other: None. IMPRESSION: No acute intracranial abnormality. No intracranial mass. No traumatic injury identified. Electronically Signed   By: Clem Savory M.D.   On: 06/14/2024 19:19   MR CERVICAL SPINE W WO CONTRAST Result Date: 06/14/2024 CLINICAL DATA:  Chronic neck pain EXAM: MRI CERVICAL SPINE WITHOUT AND WITH CONTRAST TECHNIQUE: Multiplanar and multiecho pulse sequences of the cervical spine, to include the craniocervical junction and cervicothoracic junction, were obtained without and with intravenous contrast. CONTRAST:  7.5mL GADAVIST  GADOBUTROL  1 MMOL/ML IV SOLN COMPARISON:  None Available. FINDINGS: Alignment: Physiologic. Vertebrae: Degenerative endplate marrow changes at a few levels. No acute fracture is identified. Cord: Normal signal and morphology. Posterior Fossa, vertebral arteries, paraspinal tissues: The visualized portions of the skull base and the posterior fossa are normal. No soft tissue abnormality is identified. Disc levels: C2-C3: The disk is normal in configuration. Mild bilateral facet arthropathy. No uncovertebral joint disease. No neuroforaminal stenosis. No spinal canal stenosis. C3-C4: Disc osteophyte complex. Mild bilateral facet arthropathy. Moderate bilateral uncovertebral joint disease. Moderate bilateral neuroforaminal stenosis. Mild spinal canal stenosis. C4-C5: Disc osteophyte complex. Mild bilateral facet arthropathy. Severe bilateral uncovertebral joint disease. Severe bilateral neuroforaminal stenosis.  Moderate spinal canal stenosis. C5-C6: Disc osteophyte complex. Mild bilateral facet arthropathy. Mild right uncovertebral joint disease. Mild right neuroforaminal stenosis. No spinal canal stenosis. C6-C7: The disk is normal in configuration. Moderate bilateral facet arthropathy. No uncovertebral joint disease. No neuroforaminal stenosis.  No spinal canal stenosis. C7-T1: The disk is normal in configuration. Severe bilateral facet arthropathy. No uncovertebral joint disease. No neuroforaminal stenosis. No spinal canal stenosis. IMPRESSION: 1. 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 Severe bilateral foraminal stenosis at this level. 2. Moderate bilateral foraminal stenosis at C3-C4 secondary to uncovertebral joint disease. Electronically Signed   By: Clem Savory M.D.   On: 06/14/2024 19:07   VAS US  AAA DUPLEX Result Date: 06/14/2024 ABDOMINAL AORTA STUDY Patient Name:  Robt A [facility]  Date of Exam:   06/14/2024 Medical Rec #: [id number]       Accession #:    [id number] Date of Birth: 1941-03-10       Patient Gender: M Patient Age:   63 years Exam Location:  [address] Procedure:      VAS US  AAA DUPLEX Referring Phys: LONNI GASKINS --------------------------------------------------------------------------------  Indications: Follow up exam for known AAA. Risk Factors: Hypertension, hyperlipidemia, past history of smoking, coronary               artery disease.  Comparison Study: 05/02/2024 Renal artery duplex- distal aorta 3.6 x 3.7cm Performing Technologist: Rosaline Fujisawa MHA, RDMS, RVT, RDCS  Examination Guidelines: A complete evaluation includes B-mode imaging, spectral Doppler, color Doppler, and power Doppler as needed of all accessible portions of each vessel. Bilateral testing is considered an integral part of a complete examination. Limited examinations for reoccurring indications may be performed as noted.  Abdominal Aorta Findings:  +-----------+-------+----------+----------+--------+--------+--------+ Location   AP (cm)Trans (cm)PSV (cm/s)WaveformThrombusComments +-----------+-------+----------+----------+--------+--------+--------+ Proximal   2.23   2.13      91                                 +-----------+-------+----------+----------+--------+--------+--------+ Mid        2.70   2.93      82                                 +-----------+-------+----------+----------+--------+--------+--------+ Distal     3.60   3.76      182                                +-----------+-------+----------+----------+--------+--------+--------+ RT CIA Prox1.2    1.3       222       biphasic                 +-----------+-------+----------+----------+--------+--------+--------+ LT CIA Prox1.3    1.2       163       biphasic                 +-----------+-------+----------+----------+--------+--------+--------+  Summary: Abdominal Aorta: There is evidence of abnormal dilatation of the distal Abdominal aorta. The largest aortic measurement is 3.8 cm. Stenosis: +------------------+---------------+ Location          Stent           +------------------+---------------+ Right Common Iliac50-99% stenosis +------------------+---------------+   *See table(s) above for measurements and observations.  Electronically signed by LONNI GASKINS MD on 06/14/2024 at 10:14:14 AM.    Final     Recent Results (from the past 2160 hours)  Comp Met (CMET)     Status: None   Collection Time: 06/07/24  2:10 PM  Result Value Ref Range   Sodium 139 135 -  145 mEq/L   Potassium 3.9 3.5 - 5.1 mEq/L   Chloride 104 96 - 112 mEq/L   CO2 27 19 - 32 mEq/L   Glucose, Bld 99 70 - 99 mg/dL   BUN 22 6 - 23 mg/dL   Creatinine, Ser 8.96 0.40 - 1.50 mg/dL   Total Bilirubin 0.6 0.2 - 1.2 mg/dL   Alkaline Phosphatase 60 39 - 117 U/L   AST 20 0 - 37 U/L   ALT 17 0 - 53 U/L   Total Protein 6.9 6.0 - 8.3 g/dL   Albumin 4.5 3.5 - 5.2  g/dL   GFR 32.75 >39.99 mL/min    Comment: Calculated using the CKD-EPI Creatinine Equation (2021)   Calcium  9.6 8.4 - 10.5 mg/dL  CBC w/Diff     Status: Abnormal   Collection Time: 06/07/24  2:10 PM  Result Value Ref Range   WBC 6.5 4.0 - 10.5 K/uL   RBC 4.93 4.22 - 5.81 Mil/uL   Hemoglobin 13.9 13.0 - 17.0 g/dL   HCT 58.3 60.9 - 47.9 %   MCV 84.4 78.0 - 100.0 fl   MCHC 33.3 30.0 - 36.0 g/dL   RDW 87.1 88.4 - 84.4 %   Platelets 133.0 (L) 150.0 - 400.0 K/uL   Neutrophils Relative % 63.6 43.0 - 77.0 %   Lymphocytes Relative 22.6 12.0 - 46.0 %   Monocytes Relative 12.2 (H) 3.0 - 12.0 %   Eosinophils Relative 1.0 0.0 - 5.0 %   Basophils Relative 0.6 0.0 - 3.0 %   Neutro Abs 4.1 1.4 - 7.7 K/uL   Lymphs Abs 1.5 0.7 - 4.0 K/uL   Monocytes Absolute 0.8 0.1 - 1.0 K/uL   Eosinophils Absolute 0.1 0.0 - 0.7 K/uL   Basophils Absolute 0.0 0.0 - 0.1 K/uL  Sedimentation rate     Status: None   Collection Time: 06/08/24  4:03 PM  Result Value Ref Range   Sed Rate 8 0 - 20 mm/hr  Catecholamines, fractionated, plasma     Status: Abnormal   Collection Time: 06/09/24  3:36 PM  Result Value Ref Range   Norepinephrine 838 (H) 115 - 524 pg/mL   Epinephrine 26.3 0.0 - 55.4 pg/mL   Dopamine 18.2 0.0 - 36.7 pg/mL    Comment: Catecholamines, Plasma reference intervals based on patient in supine position for at least 20 minutes.   Metanephrines, plasma     Status: None   Collection Time: 06/09/24  3:36 PM  Result Value Ref Range   Normetanephrine, Free 80.1 0.0 - 297.2 pg/mL   Metanephrine, Free <25.0 0.0 - 88.0 pg/mL  TSH     Status: None   Collection Time: 06/09/24  3:36 PM  Result Value Ref Range   TSH 1.790 0.450 - 4.500 uIU/mL  Aldosterone + renin activity w/ ratio     Status: None   Collection Time: 06/09/24  3:36 PM  Result Value Ref Range   Aldosterone 3.4 0.0 - 30.0 ng/dL   Renin Activity, Plasma 0.876 0.167 - 5.380 ng/mL/hr   Aldos/Renin Ratio 3.9 0.0 - 30.0    Comment:                           Units:      ng/dL per ng/mL/hr  Urinalysis w microscopic + reflex cultur     Status: Abnormal   Collection Time: 08/09/24 10:16 AM   Specimen: Urine  Result Value Ref Range   Color, Urine  YELLOW YELLOW   APPearance CLEAR CLEAR   Specific Gravity, Urine 1.015 1.001 - 1.035   pH 6.5 5.0 - 8.0   Glucose, UA NEGATIVE NEGATIVE   Bilirubin Urine NEGATIVE NEGATIVE   Ketones, ur NEGATIVE NEGATIVE   Hgb urine dipstick NEGATIVE NEGATIVE   Protein, ur NEGATIVE NEGATIVE   Nitrites, Initial NEGATIVE NEGATIVE   Leukocyte Esterase TRACE (A) NEGATIVE   WBC, UA 0-5 0 - 5 /HPF   RBC / HPF NONE SEEN 0 - 2 /HPF   Squamous Epithelial / HPF NONE SEEN < OR = 5 /HPF   Bacteria, UA NONE SEEN NONE SEEN /HPF   Hyaline Cast NONE SEEN NONE SEEN /LPF   Note      Comment: This urine was analyzed for the presence of WBC,  RBC, bacteria, casts, and other formed elements.  Only those elements seen were reported. . .   TSH     Status: None   Collection Time: 08/09/24 10:16 AM  Result Value Ref Range   TSH 1.70 0.35 - 5.50 uIU/mL  PSA     Status: Abnormal   Collection Time: 08/09/24 10:16 AM  Result Value Ref Range   PSA 5.76 (H) 0.10 - 4.00 ng/mL    Comment: Test performed using Access Hybritech PSA Assay, a parmagnetic partical, chemiluminecent immunoassay.  Microalbumin / creatinine urine ratio     Status: Abnormal   Collection Time: 08/09/24 10:16 AM  Result Value Ref Range   Microalb, Ur 7.2 (H) 0.0 - 1.9 mg/dL   Creatinine,U 13.4 mg/dL   Microalb Creat Ratio 82.7 (H) 0.0 - 30.0 mg/g  Lipid panel     Status: None   Collection Time: 08/09/24 10:16 AM  Result Value Ref Range   Cholesterol 125 0 - 200 mg/dL    Comment: ATP III Classification       Desirable:  < 200 mg/dL               Borderline High:  200 - 239 mg/dL          High:  > = 759 mg/dL   Triglycerides 876.9 0.0 - 149.0 mg/dL    Comment: Normal:  <849 mg/dLBorderline High:  150 - 199 mg/dL   HDL 54.29 >60.99 mg/dL   VLDL 75.3  0.0 - 59.9 mg/dL   LDL Cholesterol 55 0 - 99 mg/dL   Total CHOL/HDL Ratio 3     Comment:                Men          Women1/2 Average Risk     3.4          3.3Average Risk          5.0          4.42X Average Risk          9.6          7.13X Average Risk          15.0          11.0                       NonHDL 79.46     Comment: NOTE:  Non-HDL goal should be 30 mg/dL higher than patient's LDL goal (i.e. LDL goal of < 70 mg/dL, would have non-HDL goal of < 100 mg/dL)  Hemoglobin J8r     Status: None   Collection Time: 08/09/24 10:16 AM  Result  Value Ref Range   Hgb A1c MFr Bld 6.2 4.6 - 6.5 %    Comment: Glycemic Control Guidelines for People with Diabetes:Non Diabetic:  <6%Goal of Therapy: <7%Additional Action Suggested:  >8%   Comprehensive metabolic panel with GFR     Status: Abnormal   Collection Time: 08/09/24 10:16 AM  Result Value Ref Range   Sodium 141 135 - 145 mEq/L   Potassium 4.0 3.5 - 5.1 mEq/L   Chloride 105 96 - 112 mEq/L   CO2 27 19 - 32 mEq/L   Glucose, Bld 104 (H) 70 - 99 mg/dL   BUN 27 (H) 6 - 23 mg/dL   Creatinine, Ser 8.96 0.40 - 1.50 mg/dL   Total Bilirubin 0.7 0.2 - 1.2 mg/dL   Alkaline Phosphatase 62 39 - 117 U/L   AST 19 0 - 37 U/L   ALT 14 0 - 53 U/L   Total Protein 6.8 6.0 - 8.3 g/dL   Albumin 4.6 3.5 - 5.2 g/dL   GFR 32.83 >39.99 mL/min    Comment: Calculated using the CKD-EPI Creatinine Equation (2021)   Calcium  9.6 8.4 - 10.5 mg/dL  CBC with Differential/Platelet     Status: Abnormal   Collection Time: 08/09/24 10:16 AM  Result Value Ref Range   WBC 6.5 4.0 - 10.5 K/uL   RBC 4.93 4.22 - 5.81 Mil/uL   Hemoglobin 13.9 13.0 - 17.0 g/dL   HCT 57.9 60.9 - 47.9 %   MCV 85.2 78.0 - 100.0 fl   MCHC 33.0 30.0 - 36.0 g/dL   RDW 87.1 88.4 - 84.4 %   Platelets 131.0 (L) 150.0 - 400.0 K/uL   Neutrophils Relative % 61.5 43.0 - 77.0 %   Lymphocytes Relative 25.5 12.0 - 46.0 %   Monocytes Relative 10.8 3.0 - 12.0 %   Eosinophils Relative 1.4 0.0 - 5.0 %    Basophils Relative 0.8 0.0 - 3.0 %   Neutro Abs 4.0 1.4 - 7.7 K/uL   Lymphs Abs 1.7 0.7 - 4.0 K/uL   Monocytes Absolute 0.7 0.1 - 1.0 K/uL   Eosinophils Absolute 0.1 0.0 - 0.7 K/uL   Basophils Absolute 0.1 0.0 - 0.1 K/uL  Urine Culture     Status: None   Collection Time: 08/09/24 10:16 AM  Result Value Ref Range   MICRO NUMBER: [id number]    SPECIMEN QUALITY: Adequate    Sample Source URINE    STATUS: FINAL    Result: No Growth   REFLEXIVE URINE CULTURE     Status: None   Collection Time: 08/09/24 10:16 AM  Result Value Ref Range   REFLEXIVE URINE CULTURE      Comment: CULTURE INDICATED - RESULTS TO FOLLOW        Beverley Adine Hummer, MD, MS

## 2024-08-23 NOTE — Patient Instructions (Addendum)
 It was very nice to see you today!  VISIT SUMMARY: You came in today for a follow-up on your blood pressure management. We discussed your resistant hypertension, benign prostatic hyperplasia, dizziness, prediabetes, and other health concerns.  YOUR PLAN: RESISTANT HYPERTENSION: Your blood pressure readings at home have been fluctuating and sometimes very high. -Continue taking telmisartan  and isosorbide  as prescribed. -Stop taking hydralazine  due to dizziness and low blood pressure. -Complete a 24-hour urine catecholamine test to check for pheochromocytoma. -Follow up with your cardiologist for further management.  PROTEINURIA: You have mild protein in your urine, likely due to your high blood pressure. -Continue taking telmisartan  to protect your kidneys. -We will monitor your protein levels and kidney function.  BENIGN PROSTATIC HYPERPLASIA (BPH): You have an enlarged prostate which has caused urinary symptoms, but these have improved. -Continue with your current management plan and follow up with urology.  PREDIABETES: Your blood sugar levels are in the prediabetic range. -Make lifestyle changes such as reducing carbohydrate intake and increasing exercise to manage your blood sugar levels.  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 -Consider seeing a hematologist for further evaluation.

## 2024-08-25 ENCOUNTER — Encounter (HOSPITAL_BASED_OUTPATIENT_CLINIC_OR_DEPARTMENT_OTHER): Admitting: Family

## 2024-08-29 NOTE — Progress Notes (Signed)
 [facility] Weekly Session  Patient Details  Name: Ricardo [name] MRN: [id number] Date of Birth: [birth year] Age: 83 y.o. PCP: Sebastian Beverley NOVAK, MD  Vitals:   08/29/24 1508  Weight: 177 lb (80.3 kg)     [facility] Weekly seesion - 08/29/24 1500       [facility] PRE[facility] Location   [facility] PRE[facility] Location [facility]      Weekly Session   Topic Discussed Stress management and problem solving   Stress management, breathing technique, sleep tips   Classes attended to date [address]          [facility] 08/29/2024, 3:09 PM

## 2024-09-01 ENCOUNTER — Inpatient Hospital Stay: Admitting: Medical Oncology

## 2024-09-01 ENCOUNTER — Encounter: Payer: Self-pay | Admitting: Medical Oncology

## 2024-09-01 ENCOUNTER — Inpatient Hospital Stay: Attending: Medical Oncology

## 2024-09-01 VITALS — BP 93/53 | HR 84 | Temp 98.1°F | Resp 18 | Wt 178.1 lb

## 2024-09-01 DIAGNOSIS — Z955 Presence of coronary angioplasty implant and graft: Secondary | ICD-10-CM | POA: Diagnosis not present

## 2024-09-01 DIAGNOSIS — Z8679 Personal history of other diseases of the circulatory system: Secondary | ICD-10-CM | POA: Diagnosis not present

## 2024-09-01 DIAGNOSIS — I2510 Atherosclerotic heart disease of native coronary artery without angina pectoris: Secondary | ICD-10-CM | POA: Insufficient documentation

## 2024-09-01 DIAGNOSIS — D696 Thrombocytopenia, unspecified: Secondary | ICD-10-CM | POA: Diagnosis not present

## 2024-09-01 DIAGNOSIS — Z8551 Personal history of malignant neoplasm of bladder: Secondary | ICD-10-CM | POA: Diagnosis not present

## 2024-09-01 DIAGNOSIS — I10 Essential (primary) hypertension: Secondary | ICD-10-CM | POA: Insufficient documentation

## 2024-09-01 LAB — VITAMIN B12: Vitamin B-12: 329 pg/mL (ref 180–914)

## 2024-09-01 LAB — HEPATITIS B SURFACE ANTIGEN: Hepatitis B Surface Ag: NONREACTIVE

## 2024-09-01 LAB — FOLATE: Folate: 14.1 ng/mL (ref >5.9)

## 2024-09-01 NOTE — Progress Notes (Signed)
 Hematology and Oncology New Patient Consultation   Ricardo [name] [id number] Jun 27, 1941 83 y.o. 09/01/2024  Past Medical History:  Diagnosis Date   AAA (abdominal aortic aneurysm)    Abdominal aneurysm 08/30/2018   Arthritis    SHOULDERS, NECK , RIGHT HIP   Bladder cancer (HCC) DX   OCT 2011--  FOLLOWED BY DR WOODRUFF   S/P TURBT,  HIGH GRADE BLADDER CANCER   CAD (coronary artery disease), native coronary artery 08/30/2018   normal Left main, 30% stenosis ostial LAD, 50% stenosis OM 3, 80% stenosis mid RCA;  2.5 x 8 mm Tetra Stent Dr. Claudene   Coronary artery disease CARDIOLOGIST- DR BLANCA-  LAST VISIT 2 WKS AGO-- WILL REQUEST NOTE AND EKG   STRESS TEST -- JULY 2009   Essential hypertension 08/30/2018   Hyperlipidemia    Hypertension    Nocturia    Personal history of malignant neoplasm of bladder 08/30/2018   Post PTCA 2001-   X1 STENT TO RCA   AND 1996    Principle Diagnosis:  Thrombocytopenia   Current Therapy:   Observation   Interim History:  [name] is here for a new patient consultation for thrombocytopenia:   This is a chronic finding over the past 15+ years. I believe his lowest value was 118 four years ago.   No recent Heparin  use- No No recent vaccinations- No Any new medications within 2 weeks Supplements- Red yeast rice. Was taking Vitamin B, C, magnesium.  OTC medications- See above Heavy seltzer water use- No No recent blood transfusions- No Diet: Balanced and healthy  Nutritional Deficiency - No known Travel- No  Infections/illness- No recent  Recent hospitalizations- No recent Significant alcohol consumption (>210 grams of alcohol (15 drinks) per week in males, >140 grams of alcohol (10 drinks) per week in females - Rare 1 standard size beer intake Liver disease- No Organ transplant- No Bariatric surgery- No Autoimmune disease- No Cancer- Yes. Treated for bladder cancer in 2011 and treated with resection and BCG. Pregnancy-  N/A Hepatitis/HIV risk factors:  (eg, travel to endemic areas, blood transfusions prior to 1992, intravenous drug use, sex worker) No Family history of low platelets- No History of gallbladder surgery- No Occupational or recreations exposures to toxic substances- 1977-1994 paint/welding/gasses.  No military experience  There has been no bleeding to his knowledge: denies epistaxis, gingivitis, hemoptysis, hematemesis, hematuria, melena, excessive bruising, blood donation.  Fever RUQ/ abdominal pain Jaundice Muscles pains   Wt Readings from Last 3 Encounters:  09/01/24 178 lb 1.3 oz (80.8 kg)  08/29/24 177 lb (80.3 kg)  08/23/24 182 lb 12.8 oz (82.9 kg)     Medications:   Current Outpatient Medications:    acetaminophen  (TYLENOL ) 500 MG tablet, Take 1,000 mg by mouth daily as needed for headache (pain)., Disp: , Rfl:    alfuzosin  (UROXATRAL ) 10 MG 24 hr tablet, Take 1 tablet (10 mg total) by mouth daily with breakfast., Disp: 90 tablet, Rfl: 3   aspirin  81 MG tablet, Take 1 tablet (81 mg total) by mouth daily., Disp: 1 tablet, Rfl: 0   Evolocumab  (REPATHA  SURECLICK) 140 MG/ML SOAJ, Inject 140 mg into the skin See admin instructions. Inject 140 mg subcutaneously on the 1st and 15th of each month, Disp: 2 mL, Rfl: 11   hydrALAZINE  (APRESOLINE ) 25 MG tablet, Take 1 tablet (25 mg total) by mouth 2 (two) times daily. Ok to take 1 tablet as needed for blood pressure greater than 170 systolic., Disp: 804 tablet, Rfl: 3  isosorbide  mononitrate (IMDUR ) 60 MG 24 hr tablet, Take 1 tablet (60 mg total) by mouth in the morning and at bedtime., Disp: 180 tablet, Rfl: 3   MYRBETRIQ 25 MG TB24 tablet, Take 25 mg by mouth daily., Disp: , Rfl:    nitroGLYCERIN  (NITROSTAT ) 0.4 MG SL tablet, Place 1 tablet (0.4 mg total) under the tongue every 5 (five) minutes as needed., Disp: 25 tablet, Rfl: 3   rosuvastatin  (CRESTOR ) 5 MG tablet, Take 1 tablet (5 mg total) by mouth daily., Disp: 90 tablet, Rfl: 1    telmisartan  (MICARDIS ) 80 MG tablet, Take 1 tablet (80 mg total) by mouth daily., Disp: 90 tablet, Rfl: 3  Allergies:  Allergies  Allergen Reactions   Clonidine  Hcl Other (See Comments)    Reaction to 0.2 mg tabs - caused dry mouth, drowsiness, lightheadedness - reported 07/08/2020 (no reaction to patch)   Cyclobenzaprine Other (See Comments)    Numbness in lips   Norvasc [Amlodipine Besylate] Other (See Comments)    CAUSES TEETH TO FALL OUT    Past Medical History, Surgical history, Social history, and Family History were reviewed and updated.  Review of Systems: Review of Systems  Constitutional: Negative.   HENT:  Negative.    Eyes: Negative.   Respiratory: Negative.    Cardiovascular: Negative.   Gastrointestinal: Negative.   Endocrine: Negative.   Genitourinary: Negative.    Musculoskeletal: Negative.   Skin: Negative.   Neurological: Negative.   Hematological: Negative.   Psychiatric/Behavioral: Negative.       Physical Exam:  weight is 178 lb 1.3 oz (80.8 kg). His oral temperature is 98.1 F (36.7 C). His blood pressure is 93/53 (abnormal) and his pulse is 84. His respiration is 18 and oxygen  saturation is 96%.   Physical Exam General: NAD. Using cane for ambulation Cardiovascular: regular rate and rhythm Pulmonary: clear ant fields Abdomen: soft, nontender, + bowel sounds. Possible hepatomegaly  GU: no suprapubic tenderness Extremities: no edema, no joint deformities Skin: no rashes Neurological: Weakness but otherwise nonfocal   Lab Results  Component Value Date   WBC 6.5 08/09/2024   HGB 13.9 08/09/2024   HCT 42.0 08/09/2024   MCV 85.2 08/09/2024   PLT 131.0 (L) 08/09/2024     Chemistry      Component Value Date/Time   NA 141 08/09/2024 1016   NA 143 02/08/2024 1142   K 4.0 08/09/2024 1016   CL 105 08/09/2024 1016   CO2 27 08/09/2024 1016   BUN 27 (H) 08/09/2024 1016   BUN 25 02/08/2024 1142   CREATININE 1.03 08/09/2024 1016      Component  Value Date/Time   CALCIUM  9.6 08/09/2024 1016   ALKPHOS 62 08/09/2024 1016   AST 19 08/09/2024 1016   ALT 14 08/09/2024 1016   BILITOT 0.7 08/09/2024 1016   BILITOT 0.5 10/25/2018 1134     Encounter Diagnosis  Name Primary?   Thrombocytopenia Yes   Assessment and Plan- Patient is a 83 y.o. male who was referred to us  for thrombocytopenia:   I reviewed potential etiologies for thrombocytopenia including liver disease, splenomegaly, infectious processes, nutritional anemias, immune mediated and bone marrow disorders. Patient is not taking any medications or recent infections. Patient will proceed with labs today to check CBC, CMP, Vitamin B12, MMA, folate, LDH, Hepatitis B and C serologies, HIV serology, platelet by citrate and save smear.   #Thrombocytopenia, etiology unknown: --Labs reviewed from 08/09/2024 show a CBC with platelet of 131. CMP shows normal LFTs --Labs  today to check B12 level, folate level, Hep B and C serologies, HIV serology and homocysteine per pt request. --Evaluate for platelet abnormality with save smear. --Evaluate for liver disease and/or splenomegaly with abdominal US  which we discussed today.  --RTC based on above workup.    Disposition: RTC 6 months APP, labs or sooner if needed based on labs    Lauraine Dais PA-C 10/16/20252:19 PM

## 2024-09-02 ENCOUNTER — Inpatient Hospital Stay

## 2024-09-02 ENCOUNTER — Other Ambulatory Visit: Payer: Self-pay | Admitting: *Deleted

## 2024-09-02 DIAGNOSIS — D696 Thrombocytopenia, unspecified: Secondary | ICD-10-CM

## 2024-09-02 LAB — LACTATE DEHYDROGENASE: LDH: 186 U/L (ref 98–192)

## 2024-09-02 LAB — HOMOCYSTEINE

## 2024-09-02 LAB — HEPATITIS C ANTIBODY: HCV Ab: NONREACTIVE

## 2024-09-02 LAB — HIV ANTIBODY (ROUTINE TESTING W REFLEX): HIV Screen 4th Generation wRfx: NONREACTIVE

## 2024-09-02 LAB — HEPATITIS B SURFACE ANTIBODY,QUALITATIVE: Hep B S Ab: NONREACTIVE

## 2024-09-03 LAB — HEPATITIS B CORE ANTIBODY, TOTAL: HEP B CORE AB: NEGATIVE

## 2024-09-05 LAB — HOMOCYSTEINE: Homocysteine: 15.4 umol/L (ref 0.0–21.3)

## 2024-09-05 NOTE — Progress Notes (Signed)
 [facility] Weekly Session  Patient Details  Name: Ricardo [name] MRN: [id number] Date of Birth: July 17, 1941 Age: 83 y.o. PCP: Sebastian Beverley NOVAK, MD  Vitals:   09/05/24 1257  Weight: 180 lb (81.6 kg)     [facility] Weekly seesion - 09/05/24 1200       [facility] PRE[facility] Location   [facility] PRE[facility] Location [facility]      Weekly Session   Topic Discussed Expectations and non-scale victories   Talked half way throught the program and postive wins   Classes attended to date 11          [name] 09/05/2024, 1:00 PM

## 2024-09-06 ENCOUNTER — Ambulatory Visit: Payer: Self-pay | Admitting: Medical Oncology

## 2024-09-06 LAB — METHYLMALONIC ACID, SERUM: Methylmalonic Acid, Quantitative: 240 nmol/L (ref 0–378)

## 2024-09-09 LAB — CATECHOLAMINES, FRACTIONATED, URINE, 24 HOUR
Dopamine , 24H Ur: 144 ug/(24.h) (ref 0–510)
Dopamine, Rand Ur: 144 ug/L
Epinephrine, 24H Ur: 3 ug/(24.h) (ref 0–20)
Epinephrine, Rand Ur: 3 ug/L
Norepinephrine, 24H Ur: 47 ug/(24.h) (ref 0–135)
Norepinephrine, Rand Ur: 47 ug/L

## 2024-09-12 NOTE — Progress Notes (Signed)
 [facility] Weekly Session  Patient Details  Name: ERLAND VIVAS MRN: [id number] Date of Birth: 1941-07-29 Age: 83 y.o. PCP: Sebastian Beverley NOVAK, MD  Vitals:   09/12/24 1445  Weight: 179 lb (81.2 kg)     [facility] Weekly seesion - 09/12/24 1400       [facility] PREP Location   [facility] PRE[facility] Location [facility]      Weekly Session   Topic Discussed Other   Portion size matters and showed the class whate portion sizes look like   Classes attended to date 13          [name] 09/12/2024, 2:47 PM

## 2024-09-19 NOTE — Progress Notes (Signed)
 [facility] Weekly Session  Patient Details  Name: EINAR NOLASCO MRN: [id number] Date of Birth: [birth year] Age: 83 y.o. PCP: Sebastian Beverley NOVAK, MD  Vitals:   09/19/24 1510  Weight: 177 lb (80.3 kg)     [facility] Weekly seesion - 09/19/24 1500       [facility] PREP Location   [facility] PRE[facility] Location [facility]      Weekly Session   Topic Discussed Calorie breakdown   Talk Macros calorie and apps that can help.   Classes attended to date 64          [name] 09/19/2024, 3:11 PM

## 2024-09-26 NOTE — Progress Notes (Signed)
 [facility] Weekly Session  Patient Details  Name: GARET HOOTON MRN: [id number] Date of Birth: [birth year] Age: 83 y.o. PCP: Sebastian Beverley NOVAK, MD  Vitals:   09/26/24 1513  Weight: 175 lb (79.4 kg)     [facility] Weekly seesion - 09/26/24 1500       [facility] PREP Location   [facility] PRE[facility] Location [facility]      Weekly Session   Topic Discussed Finding support   Talked about Support, accountability, and self win   Classes attended to date 17          [name] 09/26/2024, 3:15 PM

## 2024-10-03 NOTE — Progress Notes (Signed)
 [facility] Weekly Session  Patient Details  Name: Ricardo [name] MRN: [id number] Date of Birth: Oct 21, 1941 Age: 83 y.o. PCP: Sebastian Beverley NOVAK, MD  Vitals:   10/03/24 1510  Weight: 178 lb (80.7 kg)     [facility] Weekly seesion - 10/03/24 1500       [facility] PREP Location   [facility] PRE[facility] Location [facility]      Weekly Session   Topic Discussed Hitting roadblocks   Hitting roadblocks, went over Hungry carving ques 100cal snacks good and bad.         [name] 10/03/2024, 3:14 PM

## 2024-10-03 NOTE — Progress Notes (Unsigned)
 [facility] Weekly Session  Patient Details  Name: Ricardo [name] MRN: [id number] Date of Birth: 11/26/1940 Age: 83 y.o. PCP: Sebastian Beverley NOVAK, MD  There were no vitals filed for this visit.    [name] 10/03/2024, 3:39 PM

## 2024-10-10 NOTE — Progress Notes (Signed)
 [facility] Weekly Session  Patient Details  Name: DANDRAE KUSTRA MRN: [id number] Date of Birth: 07/16/1941 Age: 83 y.o. PCP: Sebastian Beverley NOVAK, MD  Vitals:   10/10/24 1325  Weight: 177 lb (80.3 kg)     [facility] Weekly seesion - 10/10/24 1300       [facility] PREP Location   [facility] PRE[facility] Location [facility]      Weekly Session   Topic Discussed Other   How Fit and Strong, Fit testing and final assessment information   Classes attended to date [address]          Suzen Ash 10/10/2024, 1:34 PM

## 2024-10-12 NOTE — Progress Notes (Signed)
 [facility] PRE[facility] Evaluation  Patient Details  Name: Ricardo [name] MRN: [id number] Date of Birth: 08-Nov-1941 Age: 83 y.o. PCP: Sebastian Beverley NOVAK, MD  Vitals:   10/12/24 1152  BP: (!) 164/80  Pulse: 76  SpO2: 97%  Weight: 179 lb 6.4 oz (81.4 kg)     [facility] Eval - 10/12/24 1100       [facility] PREP Location   [facility] PRE[facility] Location [facility]      Referral    Referring Provider Sebastian    Reason for referral Inactivity;Obesitity/Overweight    Program Start Date 07/25/24    Program End Date 10/12/24      Measurement   Waist Circumference 40.5 inches    Waist Circumference End Program 40 inches    Hip Circumference 40.5 inches    Hip Circumference End Program 40 inches    Body fat 34.2 percent      Mobility and Daily Activities   I find it easy to walk up or down two or more flights of stairs. 2    I have no trouble taking out the trash. 2    I do housework such as vacuuming and dusting on my own without difficulty. 2    I can easily lift a gallon of milk (8lbs). 4    I can easily walk a mile. 2    I have no trouble reaching into high cupboards or reaching down to pick up something from the floor. 2    I do not have trouble doing out-door work such as loss adjuster, chartered, raking leaves, or gardening. 2      Mobility and Daily Activities   I feel younger than my age. 4    I feel independent. 4    I feel energetic. 2    I live an active life.  2    I feel strong. 2    I feel healthy. 2    I feel active as other people my age. 2      How fit and strong are you.   Fit and Strong Total Score 34         Past Medical History:  Diagnosis Date   AAA (abdominal aortic aneurysm)    Abdominal aneurysm 08/30/2018   Arthritis    SHOULDERS, NECK , RIGHT HIP   Bladder cancer (HCC) DX   OCT 2011--  FOLLOWED BY DR WOODRUFF   S/P TURBT,  HIGH GRADE BLADDER CANCER   CAD (coronary artery disease), native coronary artery 08/30/2018   normal Left main, 30% stenosis ostial LAD, 50%  stenosis OM 3, 80% stenosis mid RCA;  2.5 x 8 mm Tetra Stent Dr. Claudene   Coronary artery disease CARDIOLOGIST- DR BLANCA-  LAST VISIT 2 WKS AGO-- WILL REQUEST NOTE AND EKG   STRESS TEST -- JULY 2009   Essential hypertension 08/30/2018   Hyperlipidemia    Hypertension    Nocturia    Personal history of malignant neoplasm of bladder 08/30/2018   Post PTCA 2001-   X1 STENT TO RCA   AND 1996   Past Surgical History:  Procedure Laterality Date   CORONARY ANGIOPLASTY  1996   CORONARY ANGIOPLASTY WITH STENT PLACEMENT  2001   STENT TO RCA   CYSTO/ BLADDER BX  10-22-10   CYSTOSCOPY W/ RETROGRADES  11/12/2011   Procedure: CYSTOSCOPY WITH RETROGRADE PYELOGRAM;  Surgeon: Toribio Neysa Repine, MD;  Location: [facility];  Service: Urology;  Laterality: Bilateral;   LEFT HEART CATH  AND CORONARY ANGIOGRAPHY N/A 07/09/2020   Procedure: LEFT HEART CATH AND CORONARY ANGIOGRAPHY;  Surgeon: Claudene Pacific, MD;  Location: [facility];  Service: Cardiovascular;  Laterality: N/A;   LUMBAR FUSION     TRANSURETHRAL RESECTION OF BLADDER TUMOR  08-27-10   RIGHT ANTERIOR DOME   TRANSURETHRAL RESECTION OF BLADDER TUMOR  11/12/2011   Procedure: TRANSURETHRAL RESECTION OF BLADDER TUMOR (TURBT);  Surgeon: Toribio Neysa Repine, MD;  Location: [facility];  Service: Urology;  Laterality: N/A;  with PK Gyrus c-arm gyrus  digital ureteroscope   Social History   Tobacco Use  Smoking Status Former   Current packs/day: 0.00   Average packs/day: 1.5 packs/day for 5.0 years (7.5 ttl pk-yrs)   Types: Cigarettes   Start date: 11/05/1960   Quit date: 11/05/1965   Years since quitting: 58.9  Smokeless Tobacco Never   Ricardo [name] has complete the Prep Program on 10/12/2024. He loss 2 pound of weight.BMI went down 5. He will continue to workout at the [facility] in [place]. Goal is to build strength in legs and better flexibility   [name] 10/12/2024, 12:01 PM

## 2024-11-03 ENCOUNTER — Encounter (HOSPITAL_BASED_OUTPATIENT_CLINIC_OR_DEPARTMENT_OTHER): Admitting: Family

## 2024-11-08 ENCOUNTER — Encounter: Payer: Self-pay | Admitting: Family Medicine

## 2024-11-08 ENCOUNTER — Ambulatory Visit (INDEPENDENT_AMBULATORY_CARE_PROVIDER_SITE_OTHER): Admitting: Family Medicine

## 2024-11-08 DIAGNOSIS — Z0000 Encounter for general adult medical examination without abnormal findings: Secondary | ICD-10-CM | POA: Diagnosis not present

## 2024-11-08 NOTE — Patient Instructions (Addendum)
 I really enjoyed getting to talk with you today! I am available on Tuesdays and Thursdays for virtual visits if you have any questions or concerns, or if I can be of any further assistance.   CHECKLIST FROM ANNUAL WELLNESS VISIT:  -Follow up (please call to schedule if not scheduled after visit):   -yearly for annual wellness visit with primary care office  Here is a list of your preventive care/health maintenance measures and the plan for each if any are due:  PLAN For any measures below that may be due:    1. Can get vaccines at the pharmacy - if you do please let us  know so that we can update your record  Health Maintenance  Topic Date Due   Zoster Vaccines- Shingrix (1 of 2) 01/25/1991   COVID-19 Vaccine (1 - 2025-26 season) Never done   Medicare Annual Wellness (AWV)  10/25/2024   DTaP/Tdap/Td (4 - Td or Tdap) 12/24/2027   Pneumococcal Vaccine: 50+ Years  Completed   Influenza Vaccine  Completed   Meningococcal B Vaccine  Aged Out   Hepatitis C Screening  Discontinued    -See a dentist at least yearly  -Get your eyes checked and then per your eye specialist's recommendations  -Other issues addressed today:   1. STRESS MANAGEMENT: -can try meditating, or just sitting quietly with deep breathing while intentionally relaxing all parts of your body for 5 minutes daily -if you need further help with stress, anxiety or depression please follow up with your primary doctor or contact the wonderful folks at [facility]: [phone]   -I have included below further information regarding a healthy whole foods based diet, physical activity guidelines for adults, stress management and opportunities for social connections. I hope you find this information useful.    -----------------------------------------------------------------------------------------------------------------------------------------------------------------------------------------------------------------------------------------------------------    NUTRITION: -eat real food: lots of colorful vegetables (half the plate) and fruits -5-7 servings of vegetables and fruits per day (fresh or steamed is best), exp. 2 servings of vegetables with lunch and dinner and 2 servings of fruit per day. Berries and greens such as kale and collards are great choices.  -consume on a regular basis:  fresh fruits, fresh veggies, fish, nuts, seeds, healthy oils (such as olive oil, avocado oil), whole grains (make sure for bread/pasta/crackers/etc., that the first ingredient on label contains the word whole), legumes. -can eat small amounts of dairy and lean meat (no larger than the palm of your hand), but avoid processed meats such as ham, bacon, lunch meat, etc. -drink water -try to avoid fast food and pre-packaged foods, processed meat, ultra processed foods/beverages (donuts, candy, etc.) -most experts advise limiting sodium to < 2300mg  per day, should limit further is any chronic conditions such as high blood pressure, heart disease, diabetes, etc. The American Heart Association advised that < 1500mg  is is ideal -try to avoid foods/beverages that contain any ingredients with names you do not recognize  -try to avoid foods/beverages  with added sugar or sweeteners/sweets  -try to avoid sweet drinks (including diet drinks): soda, juice, Gatorade, sweet tea, power drinks, diet drinks -try to avoid white rice, white bread, pasta (unless whole grain)  EXERCISE GUIDELINES FOR ADULTS: -if you wish to increase your physical activity, do so gradually and with the approval of your doctor -STOP and seek medical care immediately if you have any chest pain, chest discomfort or trouble breathing when starting or  increasing exercise  -move and stretch your body, legs, feet and arms when sitting  for long periods -Physical activity guidelines for optimal health in adults: -get at least 150 minutes per week of moderate exercise (can talk, but not sing); this is about 20-30 minutes of sustained activity 5-7 days per week or two 10-15 minute episodes of sustained activity 5-7 days per week -do some muscle building/resistance training/strength training at least 2 days per week  -balance exercises 3+ days per week:   Stand somewhere where you have something sturdy to hold onto if you lose balance    1) lift up on toes, then back down, start with 5x per day and work up to 20x   2) stand and lift one leg straight out to the side so that foot is a few inches of the floor, start with 5x each side and work up to 20x each side   3) stand on one foot, start with 5 seconds each side and work up to 20 seconds on each side  If you need ideas or help with getting more active:  -Silver sneakers https://tools.silversneakers.com  -Walk with a Doc: Http://www.duncan-williams.com/  -try to include resistance (weight lifting/strength building) and balance exercises twice per week: or the following link for ideas: http://castillo-powell.com/  buyducts.dk  STRESS MANAGEMENT: -can try meditating, or just sitting quietly with deep breathing while intentionally relaxing all parts of your body for 5 minutes daily -if you need further help with stress, anxiety or depression please follow up with your primary doctor or contact the wonderful folks at [facility]: [phone]  SOCIAL CONNECTIONS: -options in [place] if you wish to engage in more social and exercise related activities:  -Silver sneakers https://tools.silversneakers.com  -Walk with a Doc: Http://www.duncan-williams.com/  -Check out the [facility] Active Adults 50+  section on the [place] [employer] (hiking clubs, book clubs, cards and games, chess, exercise classes, aquatic classes and much more) - see the website for details: https://www.[place].gov/departments/parks-recreation/active-adults50  -YouTube has lots of exercise videos for different ages and abilities as well  -[facility] (a variety of indoor and outdoor inperson activities for adults). 2198305788. [address]  -Virtual Online Classes (a variety of topics): see seniorplanet.org or call [phone]  -consider volunteering at a school, hospice center, church, senior center or elsewhere

## 2024-11-08 NOTE — Progress Notes (Signed)
 " ----------------------------------------------------------------------------------------------------------------------------------------------------------------------------------------------------------------------  Because this visit was a virtual/telehealth visit, some criteria may be missing or patient reported. Any vitals not documented were not able to be obtained and vitals that have been documented are patient reported.    MEDICARE ANNUAL PREVENTIVE CARE VISIT WITH PROVIDER (Welcome to Medicare, initial annual wellness or annual wellness exam)  Virtual Visit via Phone Note  I connected with Ricardo [name] on 11/08/2024  by phone verified that I am speaking with the correct person using two identifiers.  Location patient: home Location provider:work or home office Persons participating in the virtual visit: patient, provider  Concerns and/or follow up today: detailed intake and health/risks assessment completed on flow sheets and below- please see for details. No new concerns.   How often do you have a drink containing alcohol?n How many drinks containing alcohol do you have on a typical day when you are drinking?na How often do you have six or more drinks on one occasion?na Have you ever smoked?y Quit date if applicable? 1980  How many packs a day do/did you smoke? na Do you use smokeless tobacco?na Do you use an illicit drugs?n Do you feel safe at home?y Last dentist visit? Goes 1-2 times a year Last eye Exam and location?goes once a year, Dr. Octavia   See HM section in Epic for other details of completed HM.    ROS: negative for report of fevers, unintentional weight loss, vision changes, vision loss, hearing loss or change, chest pain, sob, hemoptysis, melena, hematochezia, hematuria,bleeding or bruising  Patient-completed extensive health risk assessment - reviewed and discussed with the patient: See Health Risk Assessment completed with patient prior to the visit  either above or in recent phone note. This was reviewed in detailed with the patient today and appropriate recommendations, orders and referrals were placed as needed per Summary below and patient instructions.   Review of Medical History: -PMH, PSH, Family History and current specialty and care providers reviewed and updated and listed below   Patient Care Team: Sebastian Beverley NOVAK, MD as PCP - General (Family Medicine) O'Neal, Darryle Ned, MD as PCP - Cardiology (Cardiology) Blanca Elsie RAMAN, MD as Consulting Physician (Cardiology)   Past Medical History:  Diagnosis Date   AAA (abdominal aortic aneurysm)    Abdominal aneurysm 08/30/2018   Arthritis    SHOULDERS, NECK , RIGHT HIP   Bladder cancer (HCC) DX   OCT 2011--  FOLLOWED BY DR WOODRUFF   S/P TURBT,  HIGH GRADE BLADDER CANCER   CAD (coronary artery disease), native coronary artery 08/30/2018   normal Left main, 30% stenosis ostial LAD, 50% stenosis OM 3, 80% stenosis mid RCA;  2.5 x 8 mm Tetra Stent Dr. Claudene   Coronary artery disease CARDIOLOGIST- DR BLANCA-  LAST VISIT 2 WKS AGO-- WILL REQUEST NOTE AND EKG   STRESS TEST -- JULY 2009   Essential hypertension 08/30/2018   Hyperlipidemia    Hypertension    Nocturia    Personal history of malignant neoplasm of bladder 08/30/2018   Post PTCA 2001-   X1 STENT TO RCA   AND 1996    Past Surgical History:  Procedure Laterality Date   CORONARY ANGIOPLASTY  1996   CORONARY ANGIOPLASTY WITH STENT PLACEMENT  2001   STENT TO RCA   CYSTO/ BLADDER BX  10-22-10   CYSTOSCOPY W/ RETROGRADES  11/12/2011   Procedure: CYSTOSCOPY WITH RETROGRADE PYELOGRAM;  Surgeon: Toribio Neysa Repine, MD;  [facility];  Service: Urology;  Laterality: Bilateral;   LEFT HEART CATH AND CORONARY ANGIOGRAPHY N/A 07/09/2020   Procedure: LEFT HEART CATH AND CORONARY ANGIOGRAPHY;  Surgeon: Claudene Pacific, MD;  Location: [facility];  Service: Cardiovascular;  Laterality: N/A;    LUMBAR FUSION     TRANSURETHRAL RESECTION OF BLADDER TUMOR  08-27-10   RIGHT ANTERIOR DOME   TRANSURETHRAL RESECTION OF BLADDER TUMOR  11/12/2011   Procedure: TRANSURETHRAL RESECTION OF BLADDER TUMOR (TURBT);  Surgeon: Toribio Neysa Repine, MD;  Location: [facility];  Service: Urology;  Laterality: N/A;  with PK Gyrus c-arm gyrus  digital ureteroscope    Social History   Socioeconomic History   Marital status: Married    Spouse name: Not on file   Number of children: 4   Years of education: Not on file   Highest education level: Not on file  Occupational History   Occupation: retired - advice worker  Tobacco Use   Smoking status: Former    Current packs/day: 0.00    Average packs/day: 1.5 packs/day for 5.0 years (7.5 ttl pk-yrs)    Types: Cigarettes    Start date: 11/05/1960    Quit date: 11/05/1965    Years since quitting: 59.0   Smokeless tobacco: Never  Vaping Use   Vaping status: Never Used  Substance and Sexual Activity   Alcohol use: No   Drug use: No   Sexual activity: Not Currently    Birth control/protection: None  Other Topics Concern   Not on file  Social History Narrative   Not on file   Social Drivers of Health   Tobacco Use: Medium Risk (11/08/2024)   Patient History    Smoking Tobacco Use: Former    Smokeless Tobacco Use: Never    Passive Exposure: Not on Actuary Strain: Not on file  Food Insecurity: No Food Insecurity (09/01/2024)   Epic    Worried About Programme Researcher, Broadcasting/film/video in the Last Year: Never true    Ran Out of Food in the Last Year: Never true  Transportation Needs: No Transportation Needs (09/01/2024)   Epic    Lack of Transportation (Medical): No    Lack of Transportation (Non-Medical): No  Physical Activity: Inactive (11/08/2024)   Exercise Vital Sign    Days of Exercise per Week: 0 days    Minutes of Exercise per Session: 0 min  Stress: Stress Concern Present (11/08/2024)   [employer] of  Occupational Health - Occupational Stress Questionnaire    Feeling of Stress: Very much  Social Connections: Moderately Isolated (11/08/2024)   Social Connection and Isolation Panel    Frequency of Communication with Friends and Family: More than three times a week    Frequency of Social Gatherings with Friends and Family: Three times a week    Attends Religious Services: Never    Active Member of Clubs or Organizations: No    Attends Banker Meetings: Never    Marital Status: Married  Catering Manager Violence: Not At Risk (09/01/2024)   Epic    Fear of Current or Ex-Partner: No    Emotionally Abused: No    Physically Abused: No    Sexually Abused: No  Depression (PHQ2-9): Medium Risk (11/08/2024)   Depression (PHQ2-9)    PHQ-2 Score: 6  Alcohol Screen: Not on file  Housing: Low Risk (09/01/2024)   Epic    Unable to Pay for Housing in the Last Year: No    Number of Times Moved in the  Last Year: 0    Homeless in the Last Year: No  Utilities: Not At Risk (09/01/2024)   Epic    Threatened with loss of utilities: No  Health Literacy: Adequate Health Literacy (11/08/2024)   B1300 Health Literacy    Frequency of need for help with medical instructions: Never    Family History  Problem Relation Age of Onset   Diabetes Mother    Hypertension Mother     Medications Ordered Prior to Encounter[1]  Allergies[2]     Physical Exam Vitals requested from patient and listed below if patient had equipment and was able to obtain at home for this virtual visit: There were no vitals filed for this visit. Estimated body mass index is 28.96 kg/m as calculated from the following:   Height as of 08/23/24: 5' 6 (1.676 m).   Weight as of 10/12/24: 179 lb 6.4 oz (81.4 kg).  EKG (optional): deferred due to virtual visit  GENERAL: alert, oriented, no acute distress detected; full vision exam deferred due to pandemic and/or virtual encounter   HEENT: atraumatic, conjunttiva  clear, no obvious abnormalities on inspection of external nose and ears  NECK: normal movements of the head and neck  LUNGS: on inspection no signs of respiratory distress, breathing rate appears normal, no obvious gross SOB, gasping or wheezing  CV: no obvious cyanosis  MS: moves all visible extremities without noticeable abnormality  PSYCH/NEURO: pleasant and cooperative, no obvious depression or anxiety, speech and thought processing grossly intact, Cognitive function grossly intact  Flowsheet Row Clinical Support from 11/08/2024 in [facility] [place]  PHQ-9 Total Score 6        11/08/2024    2:43 PM 09/01/2024    1:14 PM 08/09/2024    9:25 AM 07/27/2024    1:23 PM 03/08/2024    2:05 PM  Depression screen PHQ 2/9  Decreased Interest 0 0 0 0 1  Down, Depressed, Hopeless 0 0 0 0 2  PHQ - 2 Score 0 0 0 0 3  Altered sleeping 3   0 0  Tired, decreased energy 3   3 2   Change in appetite 0   0 2  Feeling bad or failure about yourself  0   0 2  Trouble concentrating 0   0 0  Moving slowly or fidgety/restless 0   2 2  Suicidal thoughts 0   0 0  PHQ-9 Score 6   5  11    Difficult doing work/chores    Somewhat difficult Not difficult at all     Data saved with a previous flowsheet row definition       03/08/2024    2:05 PM 07/27/2024    1:23 PM 08/09/2024    9:25 AM 11/08/2024    2:43 PM 11/08/2024    3:20 PM  Fall Risk  Falls in the past year? 1 0 0 0   Was there an injury with Fall? 0  0  0  0   Fall Risk Category Calculator 2 0 0 0   Patient at Risk for Falls Due to History of fall(s) No Fall Risks Other (Comment)  No Fall Risks  Fall risk Follow up Falls evaluation completed Falls evaluation completed Falls evaluation completed Falls evaluation completed Falls evaluation completed     Data saved with a previous flowsheet row definition     SUMMARY AND PLAN:  Encounter for Medicare annual wellness exam  Discussed applicable health  maintenance/preventive health measures and advised  and referred or ordered per patient preferences: -discussed vaccines due recs and risks, advised can get at the pharmacy, advised to let us  know if does so that we can update record Health Maintenance  Topic Date Due   Zoster Vaccines- Shingrix (1 of 2) 01/25/1991   COVID-19 Vaccine (1 - 2025-26 season) Never done   Medicare Annual Wellness (AWV)  10/25/2024   DTaP/Tdap/Td (4 - Td or Tdap) 12/24/2027   Pneumococcal Vaccine: 50+ Years  Completed   Influenza Vaccine  Completed   Meningococcal B Vaccine  Aged Out   Hepatitis C Screening  Discontinued     Education and counseling on the following was provided based on the above review of health and a plan/checklist for the patient, along with additional information discussed, was provided for the patient in the patient instructions :  -Advised on importance of completing advanced directives, discussed options for completing and provided information in patient instructions as well - he says he and his wife have talked and declined -Provided counseling and plan for difficulty hearing, declined audiology eval -Advised and counseled on a healthy lifestyle - including the importance of a healthy diet, regular physical activity, social connections and stress management. -Reviewed patient's current diet. Advised and counseled. A summary of a healthy diet was provided in the Patient Instructions.  -reviewed patient's current physical activity level and discussed exercise guidelines for adults. Discussed community resources and ideas for safe exercise at home to assist in meeting exercise guideline recommendations in a safe and healthy way. [facility] in the past. -advised counseling, evaluation for help with stress - he says he will consider but he feels that the exercise will help, number for [place] [facility] provided -Advise yearly dental visits at  minimum and regular eye exams  Follow up: see patient instructions   Patient Instructions  I really enjoyed getting to talk with you today! I am available on Tuesdays and Thursdays for virtual visits if you have any questions or concerns, or if I can be of any further assistance.   CHECKLIST FROM ANNUAL WELLNESS VISIT:  -Follow up (please call to schedule if not scheduled after visit):   -yearly for annual wellness visit with primary care office  Here is a list of your preventive care/health maintenance measures and the plan for each if any are due:  PLAN For any measures below that may be due:    1. Can get vaccines at the pharmacy - if you do please let us  know so that we can update your record  Health Maintenance  Topic Date Due   Zoster Vaccines- Shingrix (1 of 2) 01/25/1991   COVID-19 Vaccine (1 - 2025-26 season) Never done   Medicare Annual Wellness (AWV)  10/25/2024   DTaP/Tdap/Td (4 - Td or Tdap) 12/24/2027   Pneumococcal Vaccine: 50+ Years  Completed   Influenza Vaccine  Completed   Meningococcal B Vaccine  Aged Out   Hepatitis C Screening  Discontinued    -See a dentist at least yearly  -Get your eyes checked and then per your eye specialist's recommendations  -Other issues addressed today:   1. STRESS MANAGEMENT: -can try meditating, or just sitting quietly with deep breathing while intentionally relaxing all parts of your body for 5 minutes daily -if you need further help with stress, anxiety or depression please follow up with your primary doctor or contact the wonderful folks at [facility]: [phone]   -  I have included below further information regarding a healthy whole foods based diet, physical activity guidelines for adults, stress management and opportunities for social connections. I hope you find this information useful.    -----------------------------------------------------------------------------------------------------------------------------------------------------------------------------------------------------------------------------------------------------------    NUTRITION: -eat real food: lots of colorful vegetables (half the plate) and fruits -5-7 servings of vegetables and fruits per day (fresh or steamed is best), exp. 2 servings of vegetables with lunch and dinner and 2 servings of fruit per day. Berries and greens such as kale and collards are great choices.  -consume on a regular basis:  fresh fruits, fresh veggies, fish, nuts, seeds, healthy oils (such as olive oil, avocado oil), whole grains (make sure for bread/pasta/crackers/etc., that the first ingredient on label contains the word whole), legumes. -can eat small amounts of dairy and lean meat (no larger than the palm of your hand), but avoid processed meats such as ham, bacon, lunch meat, etc. -drink water -try to avoid fast food and pre-packaged foods, processed meat, ultra processed foods/beverages (donuts, candy, etc.) -most experts advise limiting sodium to < 2300mg  per day, should limit further is any chronic conditions such as high blood pressure, heart disease, diabetes, etc. The American Heart Association advised that < 1500mg  is is ideal -try to avoid foods/beverages that contain any ingredients with names you do not recognize  -try to avoid foods/beverages  with added sugar or sweeteners/sweets  -try to avoid sweet drinks (including diet drinks): soda, juice, Gatorade, sweet tea, power drinks, diet drinks -try to avoid white rice, white bread, pasta (unless whole grain)  EXERCISE GUIDELINES FOR ADULTS: -if you wish to increase your physical activity, do so gradually and with the approval of your doctor -STOP and seek medical care immediately if you have any chest pain, chest discomfort or trouble breathing when starting or  increasing exercise  -move and stretch your body, legs, feet and arms when sitting for long periods -Physical activity guidelines for optimal health in adults: -get at least 150 minutes per week of moderate exercise (can talk, but not sing); this is about 20-30 minutes of sustained activity 5-7 days per week or two 10-15 minute episodes of sustained activity 5-7 days per week -do some muscle building/resistance training/strength training at least 2 days per week  -balance exercises 3+ days per week:   Stand somewhere where you have something sturdy to hold onto if you lose balance    1) lift up on toes, then back down, start with 5x per day and work up to 20x   2) stand and lift one leg straight out to the side so that foot is a few inches of the floor, start with 5x each side and work up to 20x each side   3) stand on one foot, start with 5 seconds each side and work up to 20 seconds on each side  If you need ideas or help with getting more active:  -Silver sneakers https://tools.silversneakers.com  -Walk with a Doc: Http://www.duncan-williams.com/  -try to include resistance (weight lifting/strength building) and balance exercises twice per week: or the following link for ideas: http://castillo-powell.com/  buyducts.dk  STRESS MANAGEMENT: -can try meditating, or just sitting quietly with deep breathing while intentionally relaxing all parts of your body for 5 minutes daily -if you need further help with stress, anxiety or depression please follow up with your primary doctor or contact the wonderful folks at [facility]: [phone]  SOCIAL CONNECTIONS: -options in [place] if you wish to engage in more social and  exercise related activities:  -Silver sneakers https://tools.silversneakers.com  -Walk with a Doc: Http://www.duncan-williams.com/  -Check out the [facility] Active Adults 50+  section on the [place] [employer] (hiking clubs, book clubs, cards and games, chess, exercise classes, aquatic classes and much more) - see the website for details: https://www.[place].gov/departments/parks-recreation/active-adults50  -YouTube has lots of exercise videos for different ages and abilities as well  -[facility] (a variety of indoor and outdoor inperson activities for adults). [phone]. [address].  -Virtual Online Classes (a variety of topics): see seniorplanet.org or call [phone]  -consider volunteering at a school, hospice center, church, senior center or elsewhere            Chiquita JONELLE Cramp, DO     [1]  Current Outpatient Medications on File Prior to Visit  Medication Sig Dispense Refill   acetaminophen  (TYLENOL ) 500 MG tablet Take 1,000 mg by mouth daily as needed for headache (pain).     alfuzosin  (UROXATRAL ) 10 MG 24 hr tablet Take 1 tablet (10 mg total) by mouth daily with breakfast. 90 tablet 3   aspirin  81 MG tablet Take 1 tablet (81 mg total) by mouth daily. 1 tablet 0   Evolocumab  (REPATHA  SURECLICK) 140 MG/ML SOAJ Inject 140 mg into the skin See admin instructions. Inject 140 mg subcutaneously on the 1st and 15th of each month 2 mL 11   hydrALAZINE  (APRESOLINE ) 25 MG tablet Take 1 tablet (25 mg total) by mouth 2 (two) times daily. Ok to take 1 tablet as needed for blood pressure greater than 170 systolic. 195 tablet 3   isosorbide  mononitrate (IMDUR ) 60 MG 24 hr tablet Take 1 tablet (60 mg total) by mouth in the morning and at bedtime. 180 tablet 3   MYRBETRIQ 25 MG TB24 tablet Take 25 mg by mouth daily.     nitroGLYCERIN  (NITROSTAT ) 0.4 MG SL tablet Place 1 tablet (0.4 mg total) under the tongue every 5 (five) minutes as needed. 25 tablet 3   rosuvastatin  (CRESTOR ) 5 MG tablet Take 1 tablet (5 mg total) by mouth daily. 90 tablet 1   telmisartan  (MICARDIS ) 80 MG tablet Take 1 tablet (80 mg total) by mouth  daily. 90 tablet 3   No current facility-administered medications on file prior to visit.  [2]  Allergies Allergen Reactions   Clonidine  Hcl Other (See Comments)    Reaction to 0.2 mg tabs - caused dry mouth, drowsiness, lightheadedness - reported 07/08/2020 (no reaction to patch)   Cyclobenzaprine Other (See Comments)    Numbness in lips   Norvasc [Amlodipine Besylate] Other (See Comments)    CAUSES TEETH TO FALL OUT   "

## 2024-11-22 ENCOUNTER — Ambulatory Visit: Admitting: Family Medicine

## 2024-11-22 VITALS — BP 128/64 | HR 79 | Temp 97.5°F | Ht 66.0 in | Wt 179.4 lb

## 2024-11-22 DIAGNOSIS — G5621 Lesion of ulnar nerve, right upper limb: Secondary | ICD-10-CM | POA: Diagnosis not present

## 2024-11-22 DIAGNOSIS — R3914 Feeling of incomplete bladder emptying: Secondary | ICD-10-CM | POA: Diagnosis not present

## 2024-11-22 DIAGNOSIS — I10 Essential (primary) hypertension: Secondary | ICD-10-CM | POA: Diagnosis not present

## 2024-11-22 DIAGNOSIS — N401 Enlarged prostate with lower urinary tract symptoms: Secondary | ICD-10-CM

## 2024-11-22 MED ORDER — MINOXIDIL 10 MG PO TABS: ORAL_TABLET | ORAL | 3 refills | Status: DC

## 2024-11-22 NOTE — Patient Instructions (Addendum)
 It was very nice to see you today!     Take minoxidil  5 mg for 3 days, then increase to 10 mg daily.   Use hydralazine  only if BP is greater than 170/100.   We referred you to orthopedics for your elbow  Return in about 1 month (around 12/23/2024) for BP.   Take care, Arvella Hummer, MD, MS   PLEASE NOTE:  If you had any lab tests, please let us  know if you have not heard back within a few days. You may see your results on mychart before we have a chance to review them but we will give you a call once they are reviewed by us .   If we ordered any referrals today, please let us  know if you have not heard from their office within the next week.   If you had any urgent prescriptions sent in today, please check with the pharmacy within an hour of our visit to make sure the prescription was transmitted appropriately.   Please try these tips to maintain a healthy lifestyle:  Eat at least 3 REAL meals and 1-2 snacks per day.  Aim for no more than 5 hours between eating.  If you eat breakfast, please do so within one hour of getting up.   Each meal should contain half fruits/vegetables, one quarter protein, and one quarter carbs (no bigger than a computer mouse)  Cut down on sweet beverages. This includes juice, soda, and sweet tea.   Drink at least 1 glass of water with each meal and aim for at least 8 glasses per day  Exercise at least 150 minutes every week.

## 2024-11-22 NOTE — Progress Notes (Signed)
 " Assessment & Plan   Assessment/Plan:    Assessment and Plan Assessment & Plan Essential hypertension Hypertension with fluctuating blood pressure readings, ranging from 128/64 to 220/100. Current regimen includes telmisartan  80 mg daily, isosorbide  60 mg twice daily, alfuzosin  10 mg daily and hydralazine  25 mg three times daily. Hydralazine  is used as needed, leading to fluctuations. Discussed potential addition of minoxidil  to provide more stable blood pressure control. Minoxidil  is preferred due to its lack of renal effects and potential to reduce reliance on hydralazine . Clonidine  patch was previously ineffective. - Initiated minoxidil  5 mg once daily, with increase in 3 days to minoxidil  to 10 mg daily if no improvement. - Continue current antihypertensive regimen with telmisartan  and isosorbide . - Monitor blood pressure and adjust hydralazine  use as needed if BP is > 170/100 based on blood pressure readings.  Right ulnar entrapment of elbow Intermittent numbness in the right arm for 3-4 months, with increased frequency. No associated pain or weakness. Symptoms suggest nerve entrapment, possibly at the elbow or cervical spine. No signs of arterial involvement. - Referred to orthopedics for further evaluation and management.  Benign prostatic hyperplasia Chronic heaviness in the prostate area for over a year. No urinary symptoms such as dysuria or hematuria. Currently managed with alfuzosin  and Myrbetriq. Previous PSA slightly elevated at 5.4.Denies symptoms or signs of urinary tract infection. - Continue current medications: alfuzosin  10 mg daily and Myrbetriq 25 mg daily . - Follow up with urology for ongoing management.         There are no discontinued medications.  Return in about 1 month (around 12/23/2024) for BP.        Subjective:   Encounter date: 11/22/2024  Ricardo [name] is a 84 y.o. 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 rupture; Unstable angina (HCC); Sinus bradycardia; Hypertensive emergency; Pain in finger of right hand; Nasal valve collapse; Muscle tension dysphonia; Deviated nasal septum; Acquired trigger finger of right ring finger; Resistant hypertension; Amaurosis fugax; Cervical disc disease; Chronic post-traumatic headache, not intractable; Benign prostatic hyperplasia with lower urinary tract symptoms; Bacterial sinusitis; Chronic pain; Erectile dysfunction; Gastroesophageal reflux disease without esophagitis; Hematuria; History of primary malignant neoplasm of urinary bladder; Impaired fasting glucose; Malnutrition; Presence of coronary angioplasty implant and graft; Microalbuminuria; Chronic kidney disease due to hypertension; CKD stage G2/A2, 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   He  has a past medical history of AAA (abdominal aortic aneurysm), Abdominal aneurysm (08/30/2018), Arthritis, Bladder cancer (HCC) (DX   OCT 2011--  FOLLOWED BY DR ELIZA), CAD (coronary artery disease), native coronary artery (08/30/2018), Coronary artery disease (CARDIOLOGIST- DR BLANCA-  LAST VISIT 2 WKS AGO-- WILL REQUEST NOTE AND EKG), Essential hypertension (08/30/2018), Hyperlipidemia, Hypertension, Nocturia, Personal history of malignant neoplasm of bladder (08/30/2018), and Post PTCA (2001-   X1 STENT TO RCA).SABRA   He presents with chief complaint of Medical Management of Chronic Issues (Pt presents today for a 3 mon f/u for HLD and BP. Pt states his right arm is going to sleep periodically ) .  Discussed the use of AI scribe software for clinical note transcription with the patient, who gave verbal  consent to proceed.  History of Present Illness Ricardo [name] is an 84  year old male with hypertension who presents for management of uncontrolled blood pressure.  Hypertension - Uncontrolled blood pressure despite current regimen of telmisartan  80 mg daily, isosorbide  60 mg twice daily, and alfuzosin  10 mg daily. - Blood pressure readings fluctuate from 128/64 mmHg to 220/100 mmHg. - Continues to use hydralazine  25 mg three times a day for blood pressure spikes, although it was previously discontinued. - Describes hydralazine  as a temporary solution; blood pressure rises if a dose is missed. - Last 24-hour urine catecholamine in October 2025 was normal.   - He is also followed by Cardiology.   Right upper extremity numbness - Intermittent numbness in the right arm for the past three to four months. - Numbness extends from the elbow to the entire hand. - No associated weakness or pain. - History of a fall with head injury, involving rolling down a hill and head caught in a crevice.  Prostatic symptoms - Sensation of heaviness in the prostate area for approximately one year. - No pain during urination or other urinary symptoms. - Currently managed with alfuzosin  and Myrbetriq. - PSA slightly elevated in September; under care of a urologist.      ROS  Past Surgical History:  Procedure Laterality Date   CORONARY ANGIOPLASTY  1996   CORONARY ANGIOPLASTY WITH STENT PLACEMENT  2001   STENT TO RCA   CYSTO/ BLADDER BX  10-22-10   CYSTOSCOPY W/ RETROGRADES  11/12/2011   Procedure: CYSTOSCOPY WITH RETROGRADE PYELOGRAM;  Surgeon: Toribio Neysa Repine, MD;  [facility]: [facility];  Service: Urology;  Laterality: Bilateral;   LEFT HEART CATH AND CORONARY ANGIOGRAPHY N/A 07/09/2020   Procedure: LEFT HEART CATH AND CORONARY ANGIOGRAPHY;  Surgeon: Claudene Pacific, MD;  Location: [facility];  Service: Cardiovascular;  Laterality: N/A;   LUMBAR FUSION     TRANSURETHRAL  RESECTION OF BLADDER TUMOR  08-27-10   RIGHT ANTERIOR DOME   TRANSURETHRAL RESECTION OF BLADDER TUMOR  11/12/2011   Procedure: TRANSURETHRAL RESECTION OF BLADDER TUMOR (TURBT);  Surgeon: Toribio Neysa Repine, MD;  Location: [facility];  Service: Urology;  Laterality: N/A;  with PK Gyrus c-arm gyrus  digital ureteroscope    Medications Ordered Prior to Encounter[1]  Family History  Problem Relation Age of Onset   Diabetes Mother    Hypertension Mother     Social History   Socioeconomic History   Marital status: Married    Spouse name: Not on file   Number of children: 4   Years of education: Not on file   Highest education level: Not on file  Occupational History   Occupation: retired - advice worker  Tobacco Use   Smoking status: Former    Current packs/day: 0.00    Average packs/day: 1.5 packs/day for 5.0 years (7.5 ttl pk-yrs)    Types: Cigarettes    Start date: 11/05/1960    Quit date: 11/05/1965    Years since quitting: 59.0   Smokeless tobacco: Never  Vaping Use   Vaping status: Never Used  Substance and Sexual Activity   Alcohol use: No   Drug use: No   Sexual activity: Not Currently    Birth control/protection: None  Other Topics Concern   Not on file  Social History Narrative   Not on file   Social Drivers of Health   Tobacco Use: Medium Risk (11/08/2024)   Patient History    Smoking Tobacco Use: Former    Smokeless Tobacco Use: Never    Passive  Exposure: Not on file  Financial Resource Strain: Not on file  Food Insecurity: No Food Insecurity (09/01/2024)   Epic    Worried About Programme Researcher, Broadcasting/film/video in the Last Year: Never true    Ran Out of Food in the Last Year: Never true  Transportation Needs: No Transportation Needs (09/01/2024)   Epic    Lack of Transportation (Medical): No    Lack of Transportation (Non-Medical): No  Physical Activity: Inactive (11/08/2024)   Exercise Vital Sign    Days of Exercise per Week: 0 days     Minutes of Exercise per Session: 0 min  Stress: Stress Concern Present (11/08/2024)   [employer] of Occupational Health - Occupational Stress Questionnaire    Feeling of Stress: Very much  Social Connections: Moderately Isolated (11/08/2024)   Social Connection and Isolation Panel    Frequency of Communication with Friends and Family: More than three times a week    Frequency of Social Gatherings with Friends and Family: Three times a week    Attends Religious Services: Never    Active Member of Clubs or Organizations: No    Attends Banker Meetings: Never    Marital Status: Married  Catering Manager Violence: Not At Risk (09/01/2024)   Epic    Fear of Current or Ex-Partner: No    Emotionally Abused: No    Physically Abused: No    Sexually Abused: No  Depression (PHQ2-9): Low Risk (11/22/2024)   Depression (PHQ2-9)    PHQ-2 Score: 0  Recent Concern: Depression (PHQ2-9) - Medium Risk (11/08/2024)   Depression (PHQ2-9)    PHQ-2 Score: 6  Alcohol Screen: Not on file  Housing: Low Risk (09/01/2024)   Epic    Unable to Pay for Housing in the Last Year: No    Number of Times Moved in the Last Year: 0    Homeless in the Last Year: No  Utilities: Not At Risk (09/01/2024)   Epic    Threatened with loss of utilities: No  Health Literacy: Adequate Health Literacy (11/08/2024)   B1300 Health Literacy    Frequency of need for help with medical instructions: Never                                                                                                  Objective:  Physical Exam: BP 128/64   Pulse 79   Temp (!) 97.5 F (36.4 C)   Ht 5' 6 (1.676 m)   Wt 179 lb 6.4 oz (81.4 kg)   SpO2 98%   BMI 28.96 kg/m    Physical Exam           Physical Exam Constitutional:      Appearance: Normal appearance.  HENT:     Head: Normocephalic and atraumatic.     Right Ear: Hearing normal.     Left Ear: Hearing normal.     Nose: Nose normal.  Eyes:      General: No scleral icterus.       Right eye: No discharge.        Left eye: No  discharge.     Extraocular Movements: Extraocular movements intact.  Cardiovascular:     Rate and Rhythm: Normal rate and regular rhythm.     Heart sounds: Normal heart sounds.  Pulmonary:     Effort: Pulmonary effort is normal.     Breath sounds: Normal breath sounds.  Abdominal:     Palpations: Abdomen is soft.     Tenderness: There is no abdominal tenderness.  Skin:    General: Skin is warm.     Findings: No rash.  Neurological:     General: No focal deficit present.     Mental Status: He is alert.     Cranial Nerves: No cranial nerve deficit.  Psychiatric:        Mood and Affect: Mood normal.        Behavior: Behavior normal.        Thought Content: Thought content normal.        Judgment: Judgment normal.     No results found.  Recent Results (from the past 2160 hours)  Catecholamines, fractionated, Urine, 24 hour     Status: None   Collection Time: 08/29/24 10:56 AM  Result Value Ref Range   Epinephrine, Rand Ur 3 Undefined ug/L   Epinephrine, 24H Ur 3 0 - 20 ug/24 hr   Norepinephrine, Rand Ur 47 Undefined ug/L   Norepinephrine, 24H Ur 47 0 - 135 ug/24 hr   Dopamine, Rand Ur 144 Undefined ug/L   Dopamine , 24H Ur 144 0 - 510 ug/24 hr  Vitamin B12     Status: None   Collection Time: 09/01/24  2:12 PM  Result Value Ref Range   Vitamin B-12 329 180 - 914 pg/mL    Comment: (NOTE) This assay is not validated for testing neonatal or myeloproliferative syndrome specimens for Vitamin B12 levels. Performed at [facility], [address] [place], [place] [address]   Folate, Serum     Status: None   Collection Time: 09/01/24  2:13 PM  Result Value Ref Range   Folate 14.1 >5.9 ng/mL    Comment: Performed at [facility], [address], [place], [place] [address]  Hepatitis B surface antigen     Status: None   Collection Time: 09/01/24  2:13 PM  Result Value Ref Range    Hepatitis B Surface Ag NON REACTIVE NON REACTIVE    Comment: Performed at [facility], [address], [place], [place] [address]  Homocysteine, serum     Status: None   Collection Time: 09/01/24  2:13 PM  Result Value Ref Range   Homocysteine WRORD umol/L    Comment: (NOTE) Test not performed. The required specimen for the test ordered was not received. Received: Ref Lav Requires: Ref Plasma EDTA Notified Niels SQUIBB. 09/02/2024 Performed At: [facility] [place] [address] [place], [place] [id number] Jennette Shorter MD Ey:[id number]   Homocysteine, serum     Status: None   Collection Time: 09/01/24  2:13 PM  Result Value Ref Range   Homocysteine DUPLICATE umol/L    Comment: (NOTE) Duplicate procedure ordered. Notified Niels SQUIBB. 09/02/2024 Performed At: [facility] [address] [place], [place] [id number] Jennette Shorter MD Ey:[id number]   Hepatitis B surface antibody     Status: None   Collection Time: 09/02/24 12:58 PM  Result Value Ref Range   Hep B S Ab NON REACTIVE NON REACTIVE    Comment: (NOTE) Inconsistent with immunity, less than 10 mIU/mL.  Performed at  [facility], [address], [place], [place] [address]   Homocysteine, serum     Status: None   Collection Time: 09/02/24 12:58 PM  Result Value Ref Range   Homocysteine 15.4 0.0 - 21.3 umol/L    Comment: (NOTE) Performed At: [facility] [address] [place], [place] [id number] Jennette Shorter MD Ey:[id number]   Lactate dehydrogenase (LDH)     Status: None   Collection Time: 09/02/24 12:58 PM  Result Value Ref Range   LDH 186 98 - 192 U/L    Comment: Performed at [facility], [address] [place], [place] [address]  Methylmalonic acid, serum     Status: None   Collection Time: 09/02/24 12:58 PM  Result Value Ref Range   Methylmalonic Acid, Quantitative 240 0 - 378 nmol/L    Comment: (NOTE) This test was developed and its performance  characteristics determined by [facility]. It has not been cleared or approved by the Food and Drug Administration. Performed At: [facility] [address] [place], [place] [id number] Jennette Shorter MD Ey:[id number]   HIV antibody (with reflex)     Status: None   Collection Time: 09/02/24 12:58 PM  Result Value Ref Range   HIV Screen 4th Generation wRfx Non Reactive Non Reactive    Comment: Performed at [facility], [address] [place], [place] [address]  Hepatitis C antibody     Status: None   Collection Time: 09/02/24 12:58 PM  Result Value Ref Range   HCV Ab NON REACTIVE NON REACTIVE    Comment: (NOTE) Nonreactive HCV antibody screen is consistent with no HCV infections,  unless recent infection is suspected or other evidence exists to indicate HCV infection.  Performed at [facility], [address], [place], [place] [address]   Hepatitis B core antibody, total     Status: None   Collection Time: 09/02/24 12:58 PM  Result Value Ref Range   HEP B CORE AB Negative Negative    Comment: (NOTE) Performed At: [facility] [address] [place], [place] [id number] Jennette Shorter MD Ey:[id number]         Beverley KATHEE Hummer, MD  I,Emily Lagle,acting as a scribe for Beverley KATHEE Hummer, MD.,have documented all relevant documentation on the behalf of Beverley KATHEE Hummer, MD.  LILLETTE Beverley KATHEE Hummer, MD, have reviewed all documentation for this visit. The documentation on 11/22/2024 for the exam, diagnosis, procedures, and orders are all accurate and complete.     [1]  Current Outpatient Medications on File Prior to Visit  Medication Sig Dispense Refill   acetaminophen  (TYLENOL ) 500 MG tablet Take 1,000 mg by mouth daily as needed for headache (pain).     alfuzosin  (UROXATRAL ) 10 MG 24 hr tablet Take 1 tablet (10 mg total) by mouth daily with breakfast. 90 tablet 3   aspirin  81 MG tablet Take 1 tablet (81 mg total) by mouth daily. 1 tablet 0   Evolocumab   (REPATHA  SURECLICK) 140 MG/ML SOAJ Inject 140 mg into the skin See admin instructions. Inject 140 mg subcutaneously on the 1st and 15th of each month 2 mL 11   hydrALAZINE  (APRESOLINE ) 25 MG tablet Take 1 tablet (25 mg total) by mouth 2 (two) times daily. Ok to take 1 tablet as needed for blood pressure greater than 170 systolic. 195 tablet 3   isosorbide  mononitrate (IMDUR ) 60 MG 24 hr tablet Take 1 tablet (60 mg total) by mouth in the morning and  at bedtime. 180 tablet 3   MYRBETRIQ 25 MG TB24 tablet Take 25 mg by mouth daily.     nitroGLYCERIN  (NITROSTAT ) 0.4 MG SL tablet Place 1 tablet (0.4 mg total) under the tongue every 5 (five) minutes as needed. 25 tablet 3   rosuvastatin  (CRESTOR ) 5 MG tablet Take 1 tablet (5 mg total) by mouth daily. 90 tablet 1   telmisartan  (MICARDIS ) 80 MG tablet Take 1 tablet (80 mg total) by mouth daily. 90 tablet 3   No current facility-administered medications on file prior to visit.   "

## 2024-11-24 ENCOUNTER — Ambulatory Visit (HOSPITAL_BASED_OUTPATIENT_CLINIC_OR_DEPARTMENT_OTHER): Admitting: Family

## 2024-11-24 ENCOUNTER — Encounter (HOSPITAL_BASED_OUTPATIENT_CLINIC_OR_DEPARTMENT_OTHER): Payer: Self-pay | Admitting: Family

## 2024-11-24 VITALS — BP 124/74 | HR 78 | Ht 66.0 in | Wt 184.0 lb

## 2024-11-24 DIAGNOSIS — E785 Hyperlipidemia, unspecified: Secondary | ICD-10-CM

## 2024-11-24 DIAGNOSIS — I10 Essential (primary) hypertension: Secondary | ICD-10-CM

## 2024-11-24 DIAGNOSIS — I25118 Atherosclerotic heart disease of native coronary artery with other forms of angina pectoris: Secondary | ICD-10-CM

## 2024-11-24 NOTE — Progress Notes (Signed)
 "  [facility] Assessment:    Date:  11/24/2024   ID:  Ricardo [name], DOB [birth year], MRN [id number]  PCP:  Sebastian Beverley NOVAK, MD  Cardiologist:  Darryle ONEIDA Decent, MD  Nephrologist:  Referring MD: Sebastian Beverley NOVAK, MD   CC: Hypertension  History of Present Illness:     Discussed the use of AI scribe software for clinical note transcription with the patient, who gave verbal consent to proceed.  History of Present Illness Ricardo [name] is an 84 year old male with CAD, HLD, HTN, AAA, orthostatic hypotension, carotid artery disease, hypertension who presents for evaluation of uncontrolled blood pressure.  Prior RCA stent 2001. Admitted in 06/2020 for unstable angina. Cath 07/09/20 showed mild-moderate multivessel disease managed medically. Admitted in 04/2021 with chest pain, hypertensive emergency and bradycardia. Echo EF 60-65%, no RWMA, mild LVH, grade I DD, normal RV systolic function, no significant valvular abnormalities. Monitor in 07/2021 showed no significant arrhythmias. Nuclear stress test 07/2021 no ischemia or infarction.    History of AAA. Ultrasound from 03/2022 showed evidence of abnormal dilatation of the distal abdominal aorta, largest measurement 3.6 cm. Carotid dopplers 09/2023 revealed 1-39% stenosis in the right ICA, near-normal Left ICA.    Prior issues with dizziness and hypotension, clonidine  weaned. Hydralazine  added PRN. Renal artery ultrasound 04/2024 that showed 1-59% stenosis in bilateral renal arteries   Established with [facility] 06/09/24. He had been managing hypertension since his mid-fifties, requiring multiple medications. He was referred to PREP exercise program. BP was labile at home. For BP control Imdur  at 15mg  at bedtime and 30mg  AM. Later adjusted to 60mg  BID. Chlorthalidone , hydralazine  PRN, telmisartan  continued. Labs negative for hyperaldosteronism, normal TSH. Catecholamines were mildly elevated - 24h urine ordered but  not yet performed. On 07/08/24 advised to start Hydralazine  25mg  daily at lunch, he has not done so. Carotid duplex 07/11/24 with mild bilateral 1-39% stenosis.   At visit 08/18/24 BP remained labile, hydralazine  adjusted to 25mg  BID with additional 25mg  PRN for SBP >170. Normal 24h urine catecholamines 08/29/24  At visit 11/23/23 with primary care MInoxidil  initiated for blood pressure control at dose of 5mg  daily x 3 days then 10mg  daily thereafter. The goal was to remove some lability and potentially replace Hydralazine  with Minoxidil .  Presents today for follow up with his son. BP at home checked various times per day ranges 120-170s. No recent hypotension. Reports no shortness of breath nor dyspnea on exertion. Reports no chest pain, pressure, or tightness. No edema, orthopnea, PND. Reports no palpitations.  Notes often missing midday dose of Hydralazine  but feels BP escalates as Hydralazine  wears out of his system. He has taken one dose of Minoxidil , no side effects.    Previous antihypertensives: Clonidine  - dry mouth, lightheadedness, drowsiness Amlodipine (gingival hyperplasia)   Past Medical History:  Diagnosis Date   AAA (abdominal aortic aneurysm)    Abdominal aneurysm 08/30/2018   Arthritis    SHOULDERS, NECK , RIGHT HIP   Bladder cancer (HCC) DX   OCT 2011--  FOLLOWED BY DR WOODRUFF   S/P TURBT,  HIGH GRADE BLADDER CANCER   CAD (coronary artery disease), native coronary artery 08/30/2018   normal Left main, 30% stenosis ostial LAD, 50% stenosis OM 3, 80% stenosis mid RCA;  2.5 x 8 mm Tetra Stent Dr. Claudene   Coronary artery disease CARDIOLOGIST- DR BLANCA-  LAST VISIT 2 WKS AGO-- WILL REQUEST NOTE AND EKG   STRESS TEST -- JULY 2009  Essential hypertension 08/30/2018   Hyperlipidemia    Hypertension    Nocturia    Personal history of malignant neoplasm of bladder 08/30/2018   Post PTCA 2001-   X1 STENT TO RCA   AND 1996    Past Surgical History:  Procedure Laterality  Date   CORONARY ANGIOPLASTY  1996   CORONARY ANGIOPLASTY WITH STENT PLACEMENT  2001   STENT TO RCA   CYSTO/ BLADDER BX  10-22-10   CYSTOSCOPY W/ RETROGRADES  11/12/2011   Procedure: CYSTOSCOPY WITH RETROGRADE PYELOGRAM;  Surgeon: Toribio Neysa Repine, MD;  Location: [facility];  Service: Urology;  Laterality: Bilateral;   LEFT HEART CATH AND CORONARY ANGIOGRAPHY N/A 07/09/2020   Procedure: LEFT HEART CATH AND CORONARY ANGIOGRAPHY;  Surgeon: Claudene Pacific, MD;  Location: [facility];  Service: Cardiovascular;  Laterality: N/A;   LUMBAR FUSION     TRANSURETHRAL RESECTION OF BLADDER TUMOR  08-27-10   RIGHT ANTERIOR DOME   TRANSURETHRAL RESECTION OF BLADDER TUMOR  11/12/2011   Procedure: TRANSURETHRAL RESECTION OF BLADDER TUMOR (TURBT);  Surgeon: Toribio Neysa Repine, MD;  Location: [facility]  Service: Urology;  Laterality: N/A;  with PK Gyrus c-arm gyrus  digital ureteroscope    Current Medications: Current Meds  Medication Sig   acetaminophen  (TYLENOL ) 500 MG tablet Take 1,000 mg by mouth daily as needed for headache (pain).   alfuzosin  (UROXATRAL ) 10 MG 24 hr tablet Take 1 tablet (10 mg total) by mouth daily with breakfast.   aspirin  81 MG tablet Take 1 tablet (81 mg total) by mouth daily.   Evolocumab  (REPATHA  SURECLICK) 140 MG/ML SOAJ Inject 140 mg into the skin See admin instructions. Inject 140 mg subcutaneously on the 1st and 15th of each month   hydrALAZINE  (APRESOLINE ) 25 MG tablet Take 1 tablet (25 mg total) by mouth 2 (two) times daily. Ok to take 1 tablet as needed for blood pressure greater than 170 systolic.   isosorbide  mononitrate (IMDUR ) 60 MG 24 hr tablet Take 1 tablet (60 mg total) by mouth in the morning and at bedtime.   minoxidil  (LONITEN ) 10 MG tablet Take 0.5 tablets (5 mg total) by mouth daily for 3 days, THEN 1 tablet (10 mg total) daily for 3 days.   MYRBETRIQ 25 MG TB24 tablet Take 25 mg by mouth daily.   nitroGLYCERIN   (NITROSTAT ) 0.4 MG SL tablet Place 1 tablet (0.4 mg total) under the tongue every 5 (five) minutes as needed.   rosuvastatin  (CRESTOR ) 5 MG tablet Take 1 tablet (5 mg total) by mouth daily.   telmisartan  (MICARDIS ) 80 MG tablet Take 1 tablet (80 mg total) by mouth daily.     Allergies:   Clonidine  hcl, Cyclobenzaprine, and Norvasc [amlodipine besylate]   Social History   Socioeconomic History   Marital status: Married    Spouse name: Not on file   Number of children: 4   Years of education: Not on file   Highest education level: Not on file  Occupational History   Occupation: retired - advice worker  Tobacco Use   Smoking status: Former    Current packs/day: 0.00    Average packs/day: 1.5 packs/day for 5.0 years (7.5 ttl pk-yrs)    Types: Cigarettes    Start date: 11/05/1960    Quit date: 11/05/1965    Years since quitting: 59.0    Passive exposure: Past   Smokeless tobacco: Never  Vaping Use   Vaping status: Never Used  Substance and Sexual  Activity   Alcohol use: No   Drug use: No   Sexual activity: Not Currently    Birth control/protection: None  Other Topics Concern   Not on file  Social History Narrative   Not on file   Social Drivers of Health   Tobacco Use: Medium Risk (11/24/2024)   Patient History    Smoking Tobacco Use: Former    Smokeless Tobacco Use: Never    Passive Exposure: Past  Physicist, Medical Strain: Not on file  Food Insecurity: No Food Insecurity (09/01/2024)   Epic    Worried About Programme Researcher, Broadcasting/film/video in the Last Year: Never true    Ran Out of Food in the Last Year: Never true  Transportation Needs: No Transportation Needs (09/01/2024)   Epic    Lack of Transportation (Medical): No    Lack of Transportation (Non-Medical): No  Physical Activity: Inactive (11/08/2024)   Exercise Vital Sign    Days of Exercise per Week: 0 days    Minutes of Exercise per Session: 0 min  Stress: Stress Concern Present (11/08/2024)   [employer] of  Occupational Health - Occupational Stress Questionnaire    Feeling of Stress: Very much  Social Connections: Moderately Isolated (11/08/2024)   Social Connection and Isolation Panel    Frequency of Communication with Friends and Family: More than three times a week    Frequency of Social Gatherings with Friends and Family: Three times a week    Attends Religious Services: Never    Active Member of Clubs or Organizations: No    Attends Banker Meetings: Never    Marital Status: Married  Depression (PHQ2-9): Low Risk (11/22/2024)   Depression (PHQ2-9)    PHQ-2 Score: 0  Recent Concern: Depression (PHQ2-9) - Medium Risk (11/08/2024)   Depression (PHQ2-9)    PHQ-2 Score: 6  Alcohol Screen: Not on file  Housing: Low Risk (09/01/2024)   Epic    Unable to Pay for Housing in the Last Year: No    Number of Times Moved in the Last Year: 0    Homeless in the Last Year: No  Utilities: Not At Risk (09/01/2024)   Epic    Threatened with loss of utilities: No  Health Literacy: Adequate Health Literacy (11/08/2024)   B1300 Health Literacy    Frequency of need for help with medical instructions: Never     Family History: The patient's family history includes Diabetes in his mother; Hypertension in his mother.  ROS:   Please see the history of present illness.     All other systems reviewed and are negative.  EKGs/Labs/Other Studies Reviewed:         Recent Labs: 08/09/2024: ALT 14; BUN 27; Creatinine, Ser 1.03; Hemoglobin 13.9; Platelets 131.0; Potassium 4.0; Sodium 141; TSH 1.70   Recent Lipid Panel    Component Value Date/Time   CHOL 125 08/09/2024 1016   CHOL 100 02/18/2021 1011   TRIG 123.0 08/09/2024 1016   HDL 45.70 08/09/2024 1016   HDL 50 02/18/2021 1011   CHOLHDL 3 08/09/2024 1016   VLDL 24.6 08/09/2024 1016   LDLCALC 55 08/09/2024 1016   LDLCALC 34 02/18/2021 1011    Physical Exam:   VS:  BP 124/74 (BP Location: Right Arm, Patient Position: Sitting, Cuff  Size: Normal)   Pulse 78   Ht 5' 6 (1.676 m)   Wt 184 lb (83.5 kg)   SpO2 95%   BMI 29.70 kg/m  , BMI Body mass index is  29.7 kg/m. GENERAL:  Well appearing HEENT: Pupils equal round and reactive, fundi not visualized, oral mucosa unremarkable NECK:  No jugular venous distention, waveform within normal limits, carotid upstroke brisk and symmetric, no bruits, no thyromegaly LYMPHATICS:  No cervical adenopathy LUNGS:  Clear to auscultation bilaterally HEART:  RRR.  PMI not displaced or sustained,S1 and S2 within normal limits, no S3, no S4, no clicks, no rubs, no murmurs ABD:  Flat, positive bowel sounds normal in frequency in pitch, no bruits, no rebound, no guarding, no midline pulsatile mass, no hepatomegaly, no splenomegaly EXT:  2 plus pulses throughout, no edema, no cyanosis no clubbing SKIN:  No rashes no nodules NEURO:  Cranial nerves II through XII grossly intact, motor grossly intact throughout PSYCH:  Cognitively intact, oriented to person place and time   ASSESSMENT/PLAN:    Assessment & Plan Labile hypertension Blood pressure fluctuations with recent higher readings. Fluctuation worsened by inconsistent timing of medications. - 08/2024 24-hour urine catecholamine collection with no evidence of pheochromocytoma - Continue hydralazine  25 mg BID, additional 25 mg PRN for SBP >170, imdur  60mg  BID, continue Telmisartan  80mg  daily - Remain off chlorthalidone  as discontinued  by PCP for nocturia, frequency. -Completed PREP program at [facility]. Recommend aiming for 150 minutes of moderate intensity activity per week and following a heart healthy diet.   -Agree with PCP recommendation for Minoxidil  5mg  daily x 3 days and 10mg  daily thereafter. He will report any LE edema. Discussed nighttime dosing to split his antihypertensive regimen. If needed, could increase dose to 20mg  in the future. If has adverse effects, consider re-trial of Spironolactone . He took in 2022 but does not recall  why discontinued.   CAD / HLD, LDL goal <70 Stable with no anginal symptoms. No indication for ischemic evaluation.  GDMT Repatha  140mg  q14 days,  isosorbide  60mg  daily, Rosuvastatin  5mg  daily. No BB due to hx of bradycardia.  -Recommend aiming for 150 minutes of moderate intensity activity per week and following a heart healthy diet.    Prediabetes -Continue to follow with PCP.   Bilateral mild carotid stenosis -by duplex 07/08/24. Repeat duplex 1 year already ordered. Continue lipid management, as above.    Screening for Secondary Hypertension:     Relevant Labs/Studies:    Latest Ref Rng & Units 08/09/2024   10:16 AM 06/07/2024    2:10 PM 02/08/2024   11:42 AM  Basic Labs  Sodium 135 - 145 mEq/L 141  139  143   Potassium 3.5 - 5.1 mEq/L 4.0  3.9  4.4   Creatinine 0.40 - 1.50 mg/dL 8.96  8.96  8.81        Latest Ref Rng & Units 08/09/2024   10:16 AM 06/09/2024    3:36 PM  Thyroid    TSH 0.35 - 5.50 uIU/mL 1.70  1.790        Latest Ref Rng & Units 06/09/2024    3:36 PM  Renin/Aldosterone   Aldosterone 0.0 - 30.0 ng/dL 3.4   Aldos/Renin Ratio 0.0 - 30.0 3.9        Latest Ref Rng & Units 06/09/2024    3:36 PM  Metanephrines/Catecholamines   Epinephrine 0.0 - 55.4 pg/mL 26.3   Norepinephrine 115 - 524 pg/mL 838   Dopamine 0.0 - 36.7 pg/mL 18.2   Metanephrines 0.0 - 88.0 pg/mL <25.0   Normetanephrines  0.0 - 297.2 pg/mL 80.1           05/02/2024   11:15 AM  Renovascular   Renal  Artery US  Completed Yes     Disposition:    FU with MD/APP/PharmD in 2-3 months    Medication Adjustments/Labs and Tests Ordered: Current medicines are reviewed at length with the patient today.  Concerns regarding medicines are outlined above.  No orders of the defined types were placed in this encounter.  No orders of the defined types were placed in this encounter.    Signed, Reche GORMAN Finder, NP  11/24/2024 1:54 PM    [facility] "

## 2024-11-24 NOTE — Patient Instructions (Addendum)
 Medication Instructions:   Continue Minoxidil  5mg  (half tablet) every evening until Saturday  On Saturday, change Minoxidil  to 10mg  (whole tablet) every evening     Follow-Up: Please follow up in 2-3 months in [facility] with Dr. Raford, Reche Finder, NP or Allean Mink PharmD    Special Instructions:    If Minoxidil  has side effects or is ineffective we may consider trial of Spironolactone .

## 2024-12-05 ENCOUNTER — Other Ambulatory Visit: Payer: Self-pay | Admitting: Cardiology

## 2024-12-06 ENCOUNTER — Ambulatory Visit: Admitting: Sports Medicine

## 2024-12-13 ENCOUNTER — Other Ambulatory Visit: Payer: Self-pay

## 2024-12-13 ENCOUNTER — Emergency Department (HOSPITAL_BASED_OUTPATIENT_CLINIC_OR_DEPARTMENT_OTHER)

## 2024-12-13 ENCOUNTER — Ambulatory Visit (INDEPENDENT_AMBULATORY_CARE_PROVIDER_SITE_OTHER): Admitting: Family Medicine

## 2024-12-13 ENCOUNTER — Ambulatory Visit: Payer: Self-pay | Admitting: *Deleted

## 2024-12-13 ENCOUNTER — Inpatient Hospital Stay (HOSPITAL_BASED_OUTPATIENT_CLINIC_OR_DEPARTMENT_OTHER)
Admission: EM | Admit: 2024-12-13 | Discharge: 2024-12-16 | DRG: 291 | Disposition: A | Discharge status: Home / Self Care

## 2024-12-13 ENCOUNTER — Encounter (HOSPITAL_BASED_OUTPATIENT_CLINIC_OR_DEPARTMENT_OTHER): Payer: Self-pay

## 2024-12-13 VITALS — BP 212/98 | HR 78 | Temp 97.2°F | Ht 66.0 in | Wt 194.2 lb

## 2024-12-13 DIAGNOSIS — R7989 Other specified abnormal findings of blood chemistry: Secondary | ICD-10-CM | POA: Diagnosis not present

## 2024-12-13 DIAGNOSIS — I714 Abdominal aortic aneurysm, without rupture, unspecified: Secondary | ICD-10-CM | POA: Diagnosis present

## 2024-12-13 DIAGNOSIS — E785 Hyperlipidemia, unspecified: Secondary | ICD-10-CM | POA: Diagnosis present

## 2024-12-13 DIAGNOSIS — I2510 Atherosclerotic heart disease of native coronary artery without angina pectoris: Secondary | ICD-10-CM

## 2024-12-13 DIAGNOSIS — M25552 Pain in left hip: Secondary | ICD-10-CM | POA: Diagnosis not present

## 2024-12-13 DIAGNOSIS — R351 Nocturia: Secondary | ICD-10-CM | POA: Diagnosis present

## 2024-12-13 DIAGNOSIS — I5041 Acute combined systolic (congestive) and diastolic (congestive) heart failure: Secondary | ICD-10-CM

## 2024-12-13 DIAGNOSIS — Z833 Family history of diabetes mellitus: Secondary | ICD-10-CM | POA: Diagnosis not present

## 2024-12-13 DIAGNOSIS — Z7982 Long term (current) use of aspirin: Secondary | ICD-10-CM

## 2024-12-13 DIAGNOSIS — I1A0 Resistant hypertension: Secondary | ICD-10-CM | POA: Diagnosis present

## 2024-12-13 DIAGNOSIS — I10 Essential (primary) hypertension: Secondary | ICD-10-CM | POA: Diagnosis present

## 2024-12-13 DIAGNOSIS — N401 Enlarged prostate with lower urinary tract symptoms: Secondary | ICD-10-CM | POA: Diagnosis present

## 2024-12-13 DIAGNOSIS — Z87448 Personal history of other diseases of urinary system: Secondary | ICD-10-CM

## 2024-12-13 DIAGNOSIS — D696 Thrombocytopenia, unspecified: Secondary | ICD-10-CM | POA: Diagnosis present

## 2024-12-13 DIAGNOSIS — R635 Abnormal weight gain: Secondary | ICD-10-CM | POA: Diagnosis not present

## 2024-12-13 DIAGNOSIS — I509 Heart failure, unspecified: Secondary | ICD-10-CM | POA: Diagnosis not present

## 2024-12-13 DIAGNOSIS — E8779 Other fluid overload: Secondary | ICD-10-CM

## 2024-12-13 DIAGNOSIS — I5031 Acute diastolic (congestive) heart failure: Secondary | ICD-10-CM | POA: Diagnosis not present

## 2024-12-13 DIAGNOSIS — N182 Chronic kidney disease, stage 2 (mild): Secondary | ICD-10-CM

## 2024-12-13 DIAGNOSIS — I110 Hypertensive heart disease with heart failure: Principal | ICD-10-CM | POA: Diagnosis present

## 2024-12-13 DIAGNOSIS — Z8249 Family history of ischemic heart disease and other diseases of the circulatory system: Secondary | ICD-10-CM | POA: Diagnosis not present

## 2024-12-13 DIAGNOSIS — Z8551 Personal history of malignant neoplasm of bladder: Secondary | ICD-10-CM | POA: Diagnosis not present

## 2024-12-13 DIAGNOSIS — Z888 Allergy status to other drugs, medicaments and biological substances status: Secondary | ICD-10-CM

## 2024-12-13 DIAGNOSIS — Z981 Arthrodesis status: Secondary | ICD-10-CM

## 2024-12-13 DIAGNOSIS — R0602 Shortness of breath: Secondary | ICD-10-CM | POA: Diagnosis present

## 2024-12-13 DIAGNOSIS — Z79899 Other long term (current) drug therapy: Secondary | ICD-10-CM | POA: Diagnosis not present

## 2024-12-13 DIAGNOSIS — Z955 Presence of coronary angioplasty implant and graft: Secondary | ICD-10-CM | POA: Diagnosis not present

## 2024-12-13 DIAGNOSIS — Z87891 Personal history of nicotine dependence: Secondary | ICD-10-CM

## 2024-12-13 DIAGNOSIS — I5033 Acute on chronic diastolic (congestive) heart failure: Principal | ICD-10-CM | POA: Diagnosis present

## 2024-12-13 DIAGNOSIS — R7303 Prediabetes: Secondary | ICD-10-CM | POA: Diagnosis present

## 2024-12-13 DIAGNOSIS — E782 Mixed hyperlipidemia: Secondary | ICD-10-CM | POA: Diagnosis not present

## 2024-12-13 HISTORY — DX: Chronic diastolic (congestive) heart failure: I50.32

## 2024-12-13 LAB — BASIC METABOLIC PANEL WITH GFR
Anion gap: 10 (ref 5–15)
BUN: 16 mg/dL (ref 8–23)
CO2: 25 mmol/L (ref 22–32)
Calcium: 9.6 mg/dL (ref 8.9–10.3)
Chloride: 105 mmol/L (ref 98–111)
Creatinine, Ser: 1.16 mg/dL (ref 0.61–1.24)
GFR, Estimated: >60 mL/min
Glucose, Bld: 124 mg/dL — ABNORMAL HIGH (ref 70–99)
Potassium: 4 mmol/L (ref 3.5–5.1)
Sodium: 140 mmol/L (ref 135–145)

## 2024-12-13 LAB — CBC
HCT: 40.5 % (ref 39.0–52.0)
Hemoglobin: 13.5 g/dL (ref 13.0–17.0)
MCH: 28.4 pg (ref 26.0–34.0)
MCHC: 33.3 g/dL (ref 30.0–36.0)
MCV: 85.1 fL (ref 80.0–100.0)
Platelets: 131 10*3/uL — ABNORMAL LOW (ref 150–400)
RBC: 4.76 MIL/uL (ref 4.22–5.81)
RDW: 12.4 % (ref 11.5–15.5)
WBC: 6.5 10*3/uL (ref 4.0–10.5)
nRBC: 0 % (ref 0.0–0.2)

## 2024-12-13 LAB — TROPONIN T, HIGH SENSITIVITY
Troponin T High Sensitivity: 27 ng/L — ABNORMAL HIGH (ref 0–19)
Troponin T High Sensitivity: 28 ng/L — ABNORMAL HIGH (ref 0–19)

## 2024-12-13 LAB — PRO BRAIN NATRIURETIC PEPTIDE: Pro Brain Natriuretic Peptide: 1260 pg/mL — ABNORMAL HIGH

## 2024-12-13 MED ORDER — HYDRALAZINE HCL 20 MG/ML IJ SOLN
10.0000 mg | Freq: Once | INTRAMUSCULAR | Status: AC
  Administered 2024-12-13: 10 mg via INTRAVENOUS
  Filled 2024-12-13: qty 1

## 2024-12-13 MED ORDER — ACETAMINOPHEN 500 MG PO TABS
1000.0000 mg | ORAL_TABLET | Freq: Once | ORAL | Status: AC
  Administered 2024-12-13: 1000 mg via ORAL
  Filled 2024-12-13: qty 2

## 2024-12-13 MED ORDER — ACETAMINOPHEN 325 MG PO TABS
650.0000 mg | ORAL_TABLET | Freq: Once | ORAL | Status: AC
  Administered 2024-12-13: 650 mg via ORAL
  Filled 2024-12-13: qty 2

## 2024-12-13 MED ORDER — FUROSEMIDE 10 MG/ML IJ SOLN
60.0000 mg | Freq: Once | INTRAMUSCULAR | Status: AC
  Administered 2024-12-13: 60 mg via INTRAVENOUS
  Filled 2024-12-13: qty 6

## 2024-12-13 NOTE — Patient Instructions (Addendum)
 It was very nice to see you today!  VISIT SUMMARY: During your visit, we addressed your worsening leg swelling, shortness of breath, and left hip pain. We also reviewed your medication regimen and discussed your elevated blood pressure and kidney function.  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 -You are being referred to the emergency department for immediate evaluation and management. -We have ordered urgent blood tests to check your electrolytes and kidney function. -You will likely receive IV diuretics to help reduce the fluid buildup. -Please stop taking minoxidil  as it may be contributing to your swelling.  RESISTANT HYPERTENSION: Your blood pressure is very high despite taking multiple medications. -Your blood pressure needs to be controlled to prevent further heart problems. This will be managed in the emergency department.  CHRONIC KIDNEY DISEASE STAGE 3B: Your kidney function is reduced, which affects how your body handles fluids and medications. -We have ordered urgent blood tests to check your kidney function. -Your kidney function will be closely monitored in the emergency department to guide your treatment.  CORONARY ARTERY DISEASE: You have a history of heart disease, and your current symptoms suggest your heart may be under stress. -A cardiac evaluation will be done in the emergency department to check your heart health.  LEFT HIP PAIN: Your left hip pain is worsening and affecting your mobility. -An x-ray of your left hip will be done in the emergency department. -Pain management will be provided to help improve your mobility and quality of life.  Return if symptoms worsen or fail to improve.   Take care, Arvella Hummer, MD, MS   PLEASE NOTE:  If you had any lab tests, please let us  know if you have not heard back within a few days. You may see your  results on mychart before we have a chance to review them but we will give you a call once they are reviewed by us .   If we ordered any referrals today, please let us  know if you have not heard from their office within the next week.   If you had any urgent prescriptions sent in today, please check with the pharmacy within an hour of our visit to make sure the prescription was transmitted appropriately.   Please try these tips to maintain a healthy lifestyle:  Eat at least 3 REAL meals and 1-2 snacks per day.  Aim for no more than 5 hours between eating.  If you eat breakfast, please do so within one hour of getting up.   Each meal should contain half fruits/vegetables, one quarter protein, and one quarter carbs (no bigger than a computer mouse)  Cut down on sweet beverages. This includes juice, soda, and sweet tea.   Drink at least 1 glass of water with each meal and aim for at least 8 glasses per day  Exercise at least 150 minutes every week.

## 2024-12-13 NOTE — Telephone Encounter (Signed)
" °  FYI Only or Action Required?: FYI only for provider: appointment scheduled on 12/13/24.  Patient was last seen in primary care on 11/22/2024 by Sebastian Beverley NOVAK, MD.  Called Nurse Triage reporting Leg Swelling.  Symptoms began several weeks ago.  Interventions attempted: Nothing.  Symptoms are: gradually worsening.  Triage Disposition: See HCP Within 4 Hours (Or PCP Triage)  Patient/caregiver understands and will follow disposition?: Yes    Please advise if PCP wants patient to take dose of old Rx hydrochlorothiazide  that he is not currently taking now for the swelling. Please advise. Patient reports he has some medication at home hydrochlorothiazide  that he took before , possible 5 mg tablets.  Please advise.              Reason for Disposition  SEVERE leg swelling (e.g., swelling extends above knee, entire leg is swollen, weeping fluid)  Answer Assessment - Initial Assessment Questions Appt scheduled today . Patient reports he used to take hydrochlorothiazide  and stopped. Reports discussed with MD. Patient still has old Rx and would like to know if PCP would like for him to take hydrochlorothiazide  for the swelling . Please advise.       1. ONSET: When did the swelling start? (e.g., minutes, hours, days)     4 weeks ago  2. LOCATION: What part of the leg is swollen?  Are both legs swollen or just one leg?     Bilateral feet , legs to thighs and abdomen 3. SEVERITY: How bad is the swelling? (e.g., localized; mild, moderate, severe)     Can't not fit shoes and pants are tight around waist 4. REDNESS: Is there redness or signs of infection?     No redness but skin glissens 5. PAIN: Is the swelling painful to touch? If Yes, ask: How painful is it?   (Scale 1-10; mild, moderate or severe)     None reported 6. FEVER: Do you have a fever? If Yes, ask: What is it, how was it measured, and when did it start?      No  7. CAUSE: What do you think is  causing the leg swelling?     Not sure hx heart failure 8. MEDICAL HISTORY: Do you have a history of blood clots (e.g., DVT), cancer, heart failure, kidney disease, or liver failure?     Hx heart issues 9. RECURRENT SYMPTOM: Have you had leg swelling before? If Yes, ask: When was the last time? What happened that time?     Yes  10. OTHER SYMPTOMS: Do you have any other symptoms? (e.g., chest pain, difficulty breathing)       No chest pain SOB with exertion going up stairs. No fever . Bilateral feet swelling and can not fit into shoes. Pants tight around waist. No swelling in hands  Protocols used: Leg Swelling and Edema-A-AH  "

## 2024-12-13 NOTE — ED Provider Notes (Signed)
 " [facility] Provider Note   CSN: [id number] Arrival date & time: 12/13/24  1627     Patient presents with: Hypertension and Shortness of Breath   Ricardo [name] is a 84 y.o. male.   Patient is a 84 year old male who presents with shortness of breath and leg swelling.  He has a history of hypertension, chronic kidney disease, coronary artery disease, AAA.  His last echo showed a normal EF in 2022.  He has a history of hypertension and is on multiple medications for same.  He recently started minoxidil  4 weeks ago due to increased blood pressures.  Since that time of the last 3 weeks he has had some increased leg swelling and swelling into his abdomen and his face.  He says he has gained about 15 pounds.  He reports shortness of breath with exertion.  No chest pain or discomfort.  No cough or cold symptoms.  No fevers.  No prior history of leg swelling or known CHF.       Prior to Admission medications  Medication Sig Start Date End Date Taking? Authorizing Provider  acetaminophen  (TYLENOL ) 500 MG tablet Take 1,000 mg by mouth daily as needed for headache (pain).    [provider]  alfuzosin  (UROXATRAL ) 10 MG 24 hr tablet Take 1 tablet (10 mg total) by mouth daily with breakfast. 08/23/24 08/18/25  Sebastian Beverley NOVAK, MD  aspirin  81 MG tablet Take 1 tablet (81 mg total) by mouth daily. 11/16/11   Eliza Sieving, MD  hydrALAZINE  (APRESOLINE ) 25 MG tablet Take 1 tablet (25 mg total) by mouth 2 (two) times daily. Ok to take 1 tablet as needed for blood pressure greater than 170 systolic. 08/18/24   Vannie Reche RAMAN, NP  isosorbide  mononitrate (IMDUR ) 60 MG 24 hr tablet Take 1 tablet (60 mg total) by mouth in the morning and at bedtime. 07/20/24   Walker, Caitlin S, NP  MYRBETRIQ 25 MG TB24 tablet Take 25 mg by mouth daily. 06/21/24   [provider]  nitroGLYCERIN  (NITROSTAT ) 0.4 MG SL tablet Place 1 tablet (0.4 mg total) under the  tongue every 5 (five) minutes as needed. 08/18/24 12/13/24  Vannie Reche RAMAN, NP  REPATHA  SURECLICK 140 MG/ML SOAJ INJECT 140 MILLIGRAM INTO THE SKIN ON THE 1ST AND 15TH OF [facility] MONTH 12/09/24   West, Katlyn D, NP  rosuvastatin  (CRESTOR ) 5 MG tablet Take 1 tablet (5 mg total) by mouth daily. 09/30/21   O'NealDarryle Ned, MD  telmisartan  (MICARDIS ) 80 MG tablet Take 1 tablet (80 mg total) by mouth daily. 04/13/24   O'NealDarryle Ned, MD    Allergies: Clonidine  hcl, Cyclobenzaprine, and Norvasc [amlodipine besylate]    Review of Systems  Constitutional:  Positive for fatigue and unexpected weight change. Negative for chills, diaphoresis and fever.  HENT:  Negative for congestion, rhinorrhea and sneezing.   Eyes: Negative.   Respiratory:  Positive for shortness of breath. Negative for cough and chest tightness.   Cardiovascular:  Positive for leg swelling. Negative for chest pain.  Gastrointestinal:  Negative for abdominal pain, diarrhea, nausea and vomiting.  Genitourinary:  Negative for difficulty urinating, flank pain and frequency.  Musculoskeletal:  Negative for arthralgias and back pain.  Skin:  Negative for rash.  Neurological:  Negative for dizziness, speech difficulty, weakness, numbness and headaches.    Updated Vital Signs BP (!) 196/75   Pulse 64   Temp 97.8 F (36.6 C) (Oral)   Resp  15   Ht 5' 6 (1.676 m)   Wt 88.1 kg   SpO2 98%   BMI 31.34 kg/m   Physical Exam Constitutional:      Appearance: He is well-developed.  HENT:     Head: Normocephalic and atraumatic.  Eyes:     Pupils: Pupils are equal, round, and reactive to light.  Cardiovascular:     Rate and Rhythm: Normal rate and regular rhythm.     Heart sounds: Normal heart sounds.  Pulmonary:     Effort: Pulmonary effort is normal. No respiratory distress.     Breath sounds: Normal breath sounds. No wheezing or rales.  Chest:     Chest wall: No tenderness.  Abdominal:     General: Bowel sounds are  normal.     Palpations: Abdomen is soft.     Tenderness: There is no abdominal tenderness. There is no guarding or rebound.  Musculoskeletal:        General: Normal range of motion.     Cervical back: Normal range of motion and neck supple.     Comments: 3+ pitting edema to the lower extremities bilaterally  Lymphadenopathy:     Cervical: No cervical adenopathy.  Skin:    General: Skin is warm and dry.     Findings: No rash.  Neurological:     Mental Status: He is alert and oriented to person, place, and time.     (all labs ordered are listed, but only abnormal results are displayed) Labs Reviewed  BASIC METABOLIC PANEL WITH GFR - Abnormal; Notable for the following components:      Result Value   Glucose, Bld 124 (*)    All other components within normal limits  CBC - Abnormal; Notable for the following components:   Platelets 131 (*)    All other components within normal limits  PRO BRAIN NATRIURETIC PEPTIDE - Abnormal; Notable for the following components:   Pro Brain Natriuretic Peptide 1,260.0 (*)    All other components within normal limits  TROPONIN T, HIGH SENSITIVITY - Abnormal; Notable for the following components:   Troponin T High Sensitivity 27 (*)    All other components within normal limits  TROPONIN T, HIGH SENSITIVITY - Abnormal; Notable for the following components:   Troponin T High Sensitivity 28 (*)    All other components within normal limits    EKG: EKG Interpretation Date/Time:  Tuesday December 13 2024 16:42:17 EST Ventricular Rate:  84 PR Interval:  171 QRS Duration:  92 QT Interval:  388 QTC Calculation: 459 R Axis:   3  Text Interpretation: Sinus rhythm Abnormal R-wave progression, early transition since last tracing no significant change Confirmed by Lenor Hollering [phone]) on 12/13/2024 5:07:22 PM  Radiology: CT Head Wo Contrast Result Date: 12/13/2024 CLINICAL DATA:  Headache history of bladder cancer EXAM: CT HEAD WITHOUT CONTRAST  TECHNIQUE: Contiguous axial images were obtained from the base of the skull through the vertex without intravenous contrast. RADIATION DOSE REDUCTION: This exam was performed according to the departmental dose-optimization program which includes automated exposure control, adjustment of the mA and/or kV according to patient size and/or use of iterative reconstruction technique. COMPARISON:  MRI 06/12/2024 FINDINGS: Brain: No acute territorial infarction, hemorrhage or intracranial mass is visualized. Mild atrophy. Moderate white matter hypodensity consistent with chronic small vessel disease. The ventricles are nonenlarged Vascular: No hyperdense vessels.  Carotid vascular calcification Skull: Normal. Negative for fracture or focal lesion. Sinuses/Orbits: No acute finding. Other: None IMPRESSION: 1. No  CT evidence for acute intracranial abnormality. 2. Atrophy and chronic small vessel ischemic changes of the white matter. Electronically Signed   By: Luke Bun M.D.   On: 12/13/2024 22:49   DG Chest 2 View Result Date: 12/13/2024 EXAM: 2 VIEW(S) XRAY OF THE CHEST 12/13/2024 05:04:43 PM COMPARISON: 04/21/2021 CLINICAL HISTORY: Shortness of breath, weight gain. FINDINGS: LUNGS AND PLEURA: Vague right mid lung opacity. No pleural effusion. No pneumothorax. HEART AND MEDIASTINUM: Aortic atherosclerosis. No acute abnormality of the cardiac and mediastinal silhouettes. BONES AND SOFT TISSUES: Thoracic spondylosis. IMPRESSION: 1. Vague right mid lung opacity; recommend follow-up chest radiograph to document resolution and exclude an underlying lesion. Electronically signed by: Oneil Devonshire MD 12/13/2024 05:40 PM EST RP Workstation: [id number]     Procedures   Medications Ordered in the ED  furosemide  (LASIX ) injection 60 mg (60 mg Intravenous Given 12/13/24 1755)  acetaminophen  (TYLENOL ) tablet 1,000 mg (1,000 mg Oral Given 12/13/24 1753)  hydrALAZINE  (APRESOLINE ) injection 10 mg (10 mg Intravenous Given 12/13/24  1754)  hydrALAZINE  (APRESOLINE ) injection 10 mg (10 mg Intravenous Given 12/13/24 2130)  acetaminophen  (TYLENOL ) tablet 650 mg (650 mg Oral Given 12/13/24 2235)                                    Medical Decision Making Amount and/or Complexity of Data Reviewed Labs: ordered. Radiology: ordered.  Risk OTC drugs. Prescription drug management. Decision regarding hospitalization.   This patient presents to the ED for concern of leg swelling, elevated blood pressures, this involves an extensive number of treatment options, and is a complaint that carries with it a high risk of complications and morbidity.  I considered the following differential and admission for this acute, potentially life threatening condition.  The differential diagnosis includes CHF exacerbation, ACS, peripheral edema, arrhythmia, medication reaction  MDM:    Patient is 85 year old who presents with shortness of breath and leg swelling.  His last echo showed a normal EF but this was in 2022.  He did recently start minoxidil  for poorly controlled hypertension.  His chest x-ray does not show any evidence of pulmonary edema.  His BNP is elevated.  His troponins are minimally elevated but his EKG does not show any ischemic changes and they are flat.  His creatinine is normal.  He was given a couple doses of hydralazine  in the ED and his blood pressure has improved slightly but still elevated with systolic blood pressures in the 190s.  He has had a headache which he has had in the past.  However given his elevated blood pressure, head CT was performed which does not show any evidence of intracranial hemorrhage.  He was given dose of IV Lasix  and diuresed about 900 cc.  Overall is feeling better.  Discussed with Dr. Gail with cardiology.  He feels to be better to bring patient in and do an echocardiogram and get his blood pressure under better control.  Patient is amenable to this.  Discussed with Dr. Franky who will admit the  patient for further treatment.  (Labs, imaging, consults)  Labs: I Ordered, and personally interpreted labs.  The pertinent results include: Elevated BNP, mildly elevated troponins, normal creatinine  Imaging Studies ordered: I ordered imaging studies including chest x-ray, head CT I independently visualized and interpreted imaging. I agree with the radiologist interpretation  Additional history obtained from family at bedside.  External records from outside source obtained and reviewed  including prior notes  Cardiac Monitoring: The patient was maintained on a cardiac monitor.  If on the cardiac monitor, I personally viewed and interpreted the cardiac monitored which showed an underlying rhythm of: Sinus rhythm  Reevaluation: After the interventions noted above, I reevaluated the patient and found that they have :improved  Social Determinants of Health:    Disposition: Discharged home  Co morbidities that complicate the patient evaluation  Past Medical History:  Diagnosis Date   AAA (abdominal aortic aneurysm)    Abdominal aneurysm 08/30/2018   Arthritis    SHOULDERS, NECK , RIGHT HIP   Bladder cancer (HCC) DX   OCT 2011--  FOLLOWED BY DR WOODRUFF   S/P TURBT,  H[employer] GRADE BLADDER CANCER   CAD (coronary artery disease), native coronary artery 08/30/2018   normal Left main, 30% stenosis ostial LAD, 50% stenosis OM 3, 80% stenosis mid RCA;  2.5 x 8 mm Tetra Stent Dr. Claudene   Coronary artery disease CARDIOLOGIST- DR BLANCA-  LAST VISIT 2 WKS AGO-- WILL REQUEST NOTE AND EKG   STRESS TEST -- JULY 2009   Essential hypertension 08/30/2018   Hyperlipidemia    Hypertension    Nocturia    Personal history of malignant neoplasm of bladder 08/30/2018   Post PTCA 2001-   X1 STENT TO RCA   AND 1996     Medicines Meds ordered this encounter  Medications   furosemide  (LASIX ) injection 60 mg   acetaminophen  (TYLENOL ) tablet 1,000 mg   hydrALAZINE  (APRESOLINE ) injection 10 mg    hydrALAZINE  (APRESOLINE ) injection 10 mg   acetaminophen  (TYLENOL ) tablet 650 mg    I have reviewed the patients home medicines and have made adjustments as needed  Problem List / ED Course: Problem List Items Addressed This Visit   None Visit Diagnoses       Acute on chronic congestive heart failure, unspecified heart failure type (HCC)    -  Primary   Relevant Medications   furosemide  (LASIX ) injection 60 mg (Completed)   hydrALAZINE  (APRESOLINE ) injection 10 mg (Completed)   hydrALAZINE  (APRESOLINE ) injection 10 mg (Completed)                Final diagnoses:  Acute on chronic congestive heart failure, unspecified heart failure type [facility])    ED Discharge Orders     None          Lenor Hollering, MD 12/13/24 2309  "

## 2024-12-13 NOTE — ED Notes (Signed)
 Patient ambulated around the department. Vital signs at the end of the walk are charted below.      12/13/24 2059  Therapy Vitals  Pulse Rate 86  Resp (!) 22  MEWS Score/Color  MEWS Score 1  MEWS Score Color Green  Oxygen  Therapy/Pulse Ox  SpO2 96 %

## 2024-12-13 NOTE — ED Triage Notes (Addendum)
 States has gained 15lbs in the past 4 weeks. Recently started on a new medication (Minoxidil ) 4 weeks ago prior to swelling. C/o hypertension and shortness of breath, headache. Denies chest pain.  Taking medications as prescribed.

## 2024-12-13 NOTE — Progress Notes (Signed)
 " Assessment & Plan   Assessment/Plan:    Assessment & Plan Acute on chronic heart failure exacerbation Suspected due to new onset dyspnea, significant peripheral edema, and a 15-pound weight gain over two weeks. EKG is normal, but symptoms and history suggest fluid overload and potential cardiac insufficiency. Differential includes medication-induced edema, but heart failure is more likely given the constellation of symptoms. - Referred to emergency department for immediate evaluation and management - Ordered stat labs to assess electrolytes and kidney function - Recommended IV diuretics for fluid management - Advised against further use of minoxidil  due to potential contribution to edema - Offered ambulance transport via 911 to the emergency department, however he elects to go by private vehicle being driven by his son -Counseled on cardiac risk if they do not go to the emergency department including death  Resistant hypertension Blood pressure is extremely elevated at 203/106 mmHg despite multiple antihypertensive agents. Current regimen includes hydralazine , isosorbide  mononitrate, and telmisartan . Minoxidil  was recently added but may have contributed to edema. Blood pressure control is critical to prevent further cardiac complications.  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 Kidney function needs assessment to guide diuretic therapy and prevent further renal impairment. - Ordered stat labs to assess kidney function - Coordinated with emergency department for appropriate management  Coronary artery disease History of coronary artery disease with current symptoms suggestive of cardiac stress due to fluid overload. Previous cardiac evaluations have been normal, but current presentation warrants further investigation. - Recommended cardiac evaluation in the emergency department  Left hip pain Worsening left hip  pain, potentially exacerbated by recent weight gain and fluid retention. Pain management is necessary to improve mobility and quality of life. - Requested x-ray of the left hip in the emergency department - Requested pain management in the emergency department        Medications Discontinued During This Encounter  Medication Reason   minoxidil  (LONITEN ) 10 MG tablet     Return if symptoms worsen or fail to improve.        Subjective:   Encounter date: 12/13/2024  Ricardo [name] is a 84 y.o. male who has CAD (coronary artery disease), native coronary artery; Hyperlipidemia; Essential hypertension; Abdominal aneurysm; Personal history of malignant neoplasm of bladder; Bradycardia; Chest pain at rest; Abdominal aortic aneurysm (AAA) without rupture; Unstable angina (HCC); Sinus bradycardia; Hypertensive emergency; Pain in finger of right hand; Nasal valve collapse; Muscle tension dysphonia; Deviated nasal septum; Acquired trigger finger of right ring finger; Resistant hypertension; Amaurosis fugax; Cervical disc disease; Chronic post-traumatic headache, not intractable; Benign prostatic hyperplasia with lower urinary tract symptoms; Bacterial sinusitis; Chronic pain; Erectile dysfunction; Gastroesophageal reflux disease without esophagitis; Hematuria; History of primary malignant neoplasm of urinary bladder; Impaired fasting glucose; Malnutrition; Presence of coronary angioplasty implant and graft; Microalbuminuria; Chronic kidney disease due to hypertension; CKD stage G2/A2, 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   He  has a past medical history of AAA (abdominal aortic aneurysm), Abdominal aneurysm (08/30/2018), Arthritis, Bladder cancer (HCC) (DX   OCT 2011--  FOLLOWED BY DR ELIZA), CAD (coronary artery disease), native coronary artery  (08/30/2018), Coronary artery disease (CARDIOLOGIST- DR BLANCA-  LAST VISIT 2 WKS AGO-- WILL REQUEST NOTE AND EKG), Essential hypertension (08/30/2018), Hyperlipidemia, Hypertension, Nocturia, Personal history of malignant neoplasm of bladder (08/30/2018), and Post PTCA (  2001-   X1 STENT TO RCA).SABRA   He presents with chief complaint of Acute Visit (SOB started 2 weeks ago, pt states his entire body is swelling. State he is unable to wear his jeans anymore. ) .   Discussed the use of AI scribe software for clinical note transcription with the patient, who gave verbal consent to proceed.  History of Present Illness Ricardo [name] is an 84 year old male with coronary artery disease and resistant hypertension who presents with worsening leg swelling and shortness of breath. He is accompanied by his son.  Lower extremity edema - Significant bilateral leg swelling, progressively worsening over the past 20 days - Swelling is tender and interferes with ability to wear socks and shoes - Associated with weight gain of approximately 15 pounds over 20 days - Recently discontinued minoxidil  due to concerns about worsening edema  Dyspnea on exertion - Shortness of breath, particularly with exertion and when walking upstairs - No chest pain  Chronic pain and mobility limitation - Worsening left hip pain impacting mobility and ability to exercise - No pain medication received for hip pain - No follow-up for hip pain since initial evaluation four to five weeks ago  Medication use - Current antihypertensive regimen includes alphazasin dosing 10 mg, minoxidil  10 mg, hydralazine  25 mg BID, isosorbide  mononitrate 60 mg, and telmisartan  80 mg daily     ROS  Past Surgical History:  Procedure Laterality Date   CORONARY ANGIOPLASTY  1996   CORONARY ANGIOPLASTY WITH STENT PLACEMENT  2001   STENT TO RCA   CYSTO/ BLADDER BX  10-22-10   CYSTOSCOPY W/ RETROGRADES  11/12/2011   Procedure: CYSTOSCOPY WITH  RETROGRADE PYELOGRAM;  Surgeon: Toribio Neysa Repine, MD;  Location: [facility];  Service: Urology;  Laterality: Bilateral;   LEFT HEART CATH AND CORONARY ANGIOGRAPHY N/A 07/09/2020   Procedure: LEFT HEART CATH AND CORONARY ANGIOGRAPHY;  Surgeon: Claudene Pacific, MD;  Location: [facility];  Service: Cardiovascular;  Laterality: N/A;   LUMBAR FUSION     TRANSURETHRAL RESECTION OF BLADDER TUMOR  08-27-10   RIGHT ANTERIOR DOME   TRANSURETHRAL RESECTION OF BLADDER TUMOR  11/12/2011   Procedure: TRANSURETHRAL RESECTION OF BLADDER TUMOR (TURBT);  Surgeon: Toribio Neysa Repine, MD;  Location: [facility];  Service: Urology;  Laterality: N/A;  with PK Gyrus c-arm gyrus  digital ureteroscope    Outpatient Medications Prior to Visit  Medication Sig Dispense Refill   acetaminophen  (TYLENOL ) 500 MG tablet Take 1,000 mg by mouth daily as needed for headache (pain).     alfuzosin  (UROXATRAL ) 10 MG 24 hr tablet Take 1 tablet (10 mg total) by mouth daily with breakfast. 90 tablet 3   aspirin  81 MG tablet Take 1 tablet (81 mg total) by mouth daily. 1 tablet 0   hydrALAZINE  (APRESOLINE ) 25 MG tablet Take 1 tablet (25 mg total) by mouth 2 (two) times daily. Ok to take 1 tablet as needed for blood pressure greater than 170 systolic. 195 tablet 3   isosorbide  mononitrate (IMDUR ) 60 MG 24 hr tablet Take 1 tablet (60 mg total) by mouth in the morning and at bedtime. 180 tablet 3   MYRBETRIQ 25 MG TB24 tablet Take 25 mg by mouth daily.     nitroGLYCERIN  (NITROSTAT ) 0.4 MG SL tablet Place 1 tablet (0.4 mg total) under the tongue every 5 (five) minutes as needed. 25 tablet 3   REPATHA  SURECLICK 140 MG/ML SOAJ INJECT 140 MILLIGRAM INTO THE  SKIN ON THE 1ST AND 15TH OF EACH MONTH 2 mL 11   rosuvastatin  (CRESTOR ) 5 MG tablet Take 1 tablet (5 mg total) by mouth daily. 90 tablet 1   telmisartan  (MICARDIS ) 80 MG tablet Take 1 tablet (80 mg total) by mouth daily. 90 tablet 3   minoxidil   (LONITEN ) 10 MG tablet Take 0.5 tablets (5 mg total) by mouth daily for 3 days, THEN 1 tablet (10 mg total) daily for 3 days. 90 tablet 3   No facility-administered medications prior to visit.    Family History  Problem Relation Age of Onset   Diabetes Mother    Hypertension Mother     Social History   Socioeconomic History   Marital status: Married    Spouse name: Not on file   Number of children: 4   Years of education: Not on file   Highest education level: Not on file  Occupational History   Occupation: retired - advice worker  Tobacco Use   Smoking status: Former    Current packs/day: 0.00    Average packs/day: 1.5 packs/day for 5.0 years (7.5 ttl pk-yrs)    Types: Cigarettes    Start date: 11/05/1960    Quit date: 11/05/1965    Years since quitting: 59.1    Passive exposure: Past   Smokeless tobacco: Never  Vaping Use   Vaping status: Never Used  Substance and Sexual Activity   Alcohol use: No   Drug use: No   Sexual activity: Not Currently    Birth control/protection: None  Other Topics Concern   Not on file  Social History Narrative   Not on file   Social Drivers of Health   Tobacco Use: Medium Risk (11/24/2024)   Patient History    Smoking Tobacco Use: Former    Smokeless Tobacco Use: Never    Passive Exposure: Past  Physicist, Medical Strain: Not on file  Food Insecurity: No Food Insecurity (09/01/2024)   Epic    Worried About Programme Researcher, Broadcasting/film/video in the Last Year: Never true    Ran Out of Food in the Last Year: Never true  Transportation Needs: No Transportation Needs (09/01/2024)   Epic    Lack of Transportation (Medical): No    Lack of Transportation (Non-Medical): No  Physical Activity: Inactive (11/08/2024)   Exercise Vital Sign    Days of Exercise per Week: 0 days    Minutes of Exercise per Session: 0 min  Stress: Stress Concern Present (11/08/2024)   [employer] of Occupational Health - Occupational Stress Questionnaire    Feeling of  Stress: Very much  Social Connections: Moderately Isolated (11/08/2024)   Social Connection and Isolation Panel    Frequency of Communication with Friends and Family: More than three times a week    Frequency of Social Gatherings with Friends and Family: Three times a week    Attends Religious Services: Never    Active Member of Clubs or Organizations: No    Attends Banker Meetings: Never    Marital Status: Married  Catering Manager Violence: Not At Risk (09/01/2024)   Epic    Fear of Current or Ex-Partner: No    Emotionally Abused: No    Physically Abused: No    Sexually Abused: No  Depression (PHQ2-9): Low Risk (12/13/2024)   Depression (PHQ2-9)    PHQ-2 Score: 0  Recent Concern: Depression (PHQ2-9) - Medium Risk (11/08/2024)   Depression (PHQ2-9)    PHQ-2 Score: 6  Alcohol Screen: Not on  file  Housing: Low Risk (09/01/2024)   Epic    Unable to Pay for Housing in the Last Year: No    Number of Times Moved in the Last Year: 0    Homeless in the Last Year: No  Utilities: Not At Risk (09/01/2024)   Epic    Threatened with loss of utilities: No  Health Literacy: Adequate Health Literacy (11/08/2024)   B1300 Health Literacy    Frequency of need for help with medical instructions: Never                                                                                                  Objective:  Physical Exam: BP (!) 212/98   Pulse 78   Temp (!) 97.2 F (36.2 C)   Ht 5' 6 (1.676 m)   Wt 194 lb 3.2 oz (88.1 kg)   SpO2 96%   BMI 31.34 kg/m   Wt Readings from Last 3 Encounters:  12/13/24 194 lb 3.2 oz (88.1 kg)  11/24/24 184 lb (83.5 kg)  11/22/24 179 lb 6.4 oz (81.4 kg)    Physical Exam VITALS: BP- 203/106 MEASUREMENTS: Weight- 194. GENERAL: Alert, cooperative, well developed, no acute distress. HEENT: Normocephalic, normal oropharynx, moist mucous membranes. CHEST: Clear to auscultation bilaterally, no wheezes, rhonchi, or crackles. CARDIOVASCULAR:  Regular rate and rhythm, S1 and S2 normal without murmurs. ABDOMEN: Soft, non-tender, distended, without organomegaly, normal bowel sounds. EXTREMITIES: 3+ pitting edema in lower extremities, no cyanosis. NEUROLOGICAL: Cranial nerves grossly intact, moves all extremities without gross motor or sensory deficit.   Physical Exam  No results found.  No results found for this or any previous visit (from the past 2160 hours).      Beverley Adine Hummer, MD, MS "

## 2024-12-14 ENCOUNTER — Telehealth (HOSPITAL_COMMUNITY): Payer: Self-pay

## 2024-12-14 ENCOUNTER — Encounter (HOSPITAL_COMMUNITY): Payer: Self-pay | Admitting: Internal Medicine

## 2024-12-14 ENCOUNTER — Telehealth: Payer: Self-pay | Admitting: Pharmacy Technician

## 2024-12-14 ENCOUNTER — Inpatient Hospital Stay (HOSPITAL_COMMUNITY)

## 2024-12-14 ENCOUNTER — Encounter: Payer: Self-pay | Admitting: Pharmacy Technician

## 2024-12-14 ENCOUNTER — Other Ambulatory Visit (HOSPITAL_COMMUNITY): Payer: Self-pay

## 2024-12-14 DIAGNOSIS — I10 Essential (primary) hypertension: Secondary | ICD-10-CM | POA: Diagnosis not present

## 2024-12-14 DIAGNOSIS — I5031 Acute diastolic (congestive) heart failure: Secondary | ICD-10-CM | POA: Diagnosis not present

## 2024-12-14 DIAGNOSIS — I509 Heart failure, unspecified: Secondary | ICD-10-CM

## 2024-12-14 DIAGNOSIS — R7989 Other specified abnormal findings of blood chemistry: Secondary | ICD-10-CM | POA: Diagnosis not present

## 2024-12-14 DIAGNOSIS — R0602 Shortness of breath: Secondary | ICD-10-CM | POA: Diagnosis present

## 2024-12-14 DIAGNOSIS — I5033 Acute on chronic diastolic (congestive) heart failure: Secondary | ICD-10-CM | POA: Diagnosis present

## 2024-12-14 DIAGNOSIS — E782 Mixed hyperlipidemia: Secondary | ICD-10-CM | POA: Diagnosis not present

## 2024-12-14 LAB — TROPONIN T, HIGH SENSITIVITY: Troponin T High Sensitivity: 25 ng/L — ABNORMAL HIGH (ref 0–19)

## 2024-12-14 LAB — MRSA NEXT GEN BY PCR, NASAL: MRSA by PCR Next Gen: NOT DETECTED

## 2024-12-14 LAB — CBC WITH DIFFERENTIAL/PLATELET
Abs Immature Granulocytes: 0.01 10*3/uL (ref 0.00–0.07)
Basophils Absolute: 0.1 10*3/uL (ref 0.0–0.1)
Basophils Relative: 1 %
Eosinophils Absolute: 0.2 10*3/uL (ref 0.0–0.5)
Eosinophils Relative: 3 %
HCT: 39 % (ref 39.0–52.0)
Hemoglobin: 12.8 g/dL — ABNORMAL LOW (ref 13.0–17.0)
Immature Granulocytes: 0 %
Lymphocytes Relative: 19 %
Lymphs Abs: 1.2 10*3/uL (ref 0.7–4.0)
MCH: 28.2 pg (ref 26.0–34.0)
MCHC: 32.8 g/dL (ref 30.0–36.0)
MCV: 85.9 fL (ref 80.0–100.0)
Monocytes Absolute: 0.8 10*3/uL (ref 0.1–1.0)
Monocytes Relative: 13 %
Neutro Abs: 4.1 10*3/uL (ref 1.7–7.7)
Neutrophils Relative %: 64 %
Platelets: 124 10*3/uL — ABNORMAL LOW (ref 150–400)
RBC: 4.54 MIL/uL (ref 4.22–5.81)
RDW: 12.5 % (ref 11.5–15.5)
WBC: 6.3 10*3/uL (ref 4.0–10.5)
nRBC: 0 % (ref 0.0–0.2)

## 2024-12-14 LAB — PRO BRAIN NATRIURETIC PEPTIDE: Pro Brain Natriuretic Peptide: 1416 pg/mL — ABNORMAL HIGH

## 2024-12-14 LAB — MAGNESIUM: Magnesium: 2.4 mg/dL (ref 1.7–2.4)

## 2024-12-14 LAB — ECHOCARDIOGRAM COMPLETE
Area-P 1/2: 2.76 cm2
Height: 66 in
S' Lateral: 2.7 cm
Weight: 2924.18 [oz_av]

## 2024-12-14 LAB — URINE DRUG SCREEN
Amphetamines: NEGATIVE
Barbiturates: NEGATIVE
Benzodiazepines: NEGATIVE
Cocaine: NEGATIVE
Fentanyl: NEGATIVE
Methadone Scn, Ur: NEGATIVE
Opiates: NEGATIVE
Tetrahydrocannabinol: NEGATIVE

## 2024-12-14 LAB — COMPREHENSIVE METABOLIC PANEL WITH GFR
ALT: 17 U/L (ref 0–44)
AST: 22 U/L (ref 15–41)
Albumin: 4.1 g/dL (ref 3.5–5.0)
Alkaline Phosphatase: 70 U/L (ref 38–126)
Anion gap: 10 (ref 5–15)
BUN: 21 mg/dL (ref 8–23)
CO2: 28 mmol/L (ref 22–32)
Calcium: 9.1 mg/dL (ref 8.9–10.3)
Chloride: 102 mmol/L (ref 98–111)
Creatinine, Ser: 1.3 mg/dL — ABNORMAL HIGH (ref 0.61–1.24)
GFR, Estimated: 55 mL/min — ABNORMAL LOW
Glucose, Bld: 180 mg/dL — ABNORMAL HIGH (ref 70–99)
Potassium: 4 mmol/L (ref 3.5–5.1)
Sodium: 140 mmol/L (ref 135–145)
Total Bilirubin: 0.5 mg/dL (ref 0.0–1.2)
Total Protein: 6.6 g/dL (ref 6.5–8.1)

## 2024-12-14 LAB — T4, FREE: Free T4: 1.56 ng/dL (ref 0.80–2.00)

## 2024-12-14 LAB — PROCALCITONIN: Procalcitonin: <0.1 ng/mL

## 2024-12-14 LAB — TSH: TSH: 2.61 u[IU]/mL (ref 0.350–4.500)

## 2024-12-14 MED ORDER — ACETAMINOPHEN 650 MG RE SUPP: 650.0000 mg | Freq: Four times a day (QID) | RECTAL | Status: DC | PRN

## 2024-12-14 MED ORDER — FUROSEMIDE 10 MG/ML IJ SOLN
40.0000 mg | Freq: Two times a day (BID) | INTRAMUSCULAR | Status: AC
  Administered 2024-12-14 – 2024-12-15 (×4): 40 mg via INTRAVENOUS
  Filled 2024-12-14 (×4): qty 4

## 2024-12-14 MED ORDER — MINOXIDIL 10 MG PO TABS: ORAL_TABLET | ORAL | 3 refills | Status: DC

## 2024-12-14 MED ORDER — ASPIRIN 81 MG PO TBEC
81.0000 mg | DELAYED_RELEASE_TABLET | Freq: Every day | ORAL | Status: DC
  Administered 2024-12-14 – 2024-12-16 (×3): 81 mg via ORAL
  Filled 2024-12-14 (×3): qty 1

## 2024-12-14 MED ORDER — ACETAMINOPHEN 500 MG PO TABS
1000.0000 mg | ORAL_TABLET | Freq: Four times a day (QID) | ORAL | Status: DC | PRN
  Filled 2024-12-14: qty 2

## 2024-12-14 MED ORDER — IRBESARTAN 150 MG PO TABS
300.0000 mg | ORAL_TABLET | Freq: Every day | ORAL | Status: DC
  Administered 2024-12-14 – 2024-12-16 (×3): 300 mg via ORAL
  Filled 2024-12-14 (×3): qty 2

## 2024-12-14 MED ORDER — ENOXAPARIN SODIUM 40 MG/0.4ML IJ SOSY
40.0000 mg | PREFILLED_SYRINGE | INTRAMUSCULAR | Status: DC
  Administered 2024-12-14 – 2024-12-16 (×3): 40 mg via SUBCUTANEOUS
  Filled 2024-12-14 (×3): qty 0.4

## 2024-12-14 MED ORDER — TELMISARTAN 80 MG PO TABS: 80.0000 mg | ORAL_TABLET | Freq: Every day | ORAL | 3 refills | Status: AC

## 2024-12-14 MED ORDER — CHLORTHALIDONE 25 MG PO TABS: 12.5000 mg | ORAL_TABLET | Freq: Every day | ORAL | 3 refills | Status: DC

## 2024-12-14 MED ORDER — ACETAMINOPHEN 325 MG PO TABS: 650.0000 mg | ORAL_TABLET | Freq: Four times a day (QID) | ORAL | Status: DC | PRN

## 2024-12-14 MED ORDER — NITROGLYCERIN IN D5W 200-5 MCG/ML-% IV SOLN
0.0000 ug/min | INTRAVENOUS | Status: DC
  Administered 2024-12-14: 5 ug/min via INTRAVENOUS
  Filled 2024-12-14: qty 250

## 2024-12-14 MED ORDER — REPATHA SURECLICK 140 MG/ML ~~LOC~~ SOAJ
140.0000 mg | SUBCUTANEOUS | 11 refills | Status: AC
  Filled 2024-12-21: qty 2, 14d supply, fill #0
  Filled 2024-12-21: qty 2, 28d supply, fill #0

## 2024-12-14 MED ORDER — FINASTERIDE 5 MG PO TABS: 5.0000 mg | ORAL_TABLET | Freq: Every day | ORAL | 3 refills | Status: AC

## 2024-12-14 MED ORDER — ROSUVASTATIN CALCIUM 5 MG PO TABS
5.0000 mg | ORAL_TABLET | Freq: Every day | ORAL | Status: DC
  Administered 2024-12-14 – 2024-12-16 (×3): 5 mg via ORAL
  Filled 2024-12-14 (×3): qty 1

## 2024-12-14 MED ORDER — NITROGLYCERIN 0.4 MG SL SUBL
SUBLINGUAL_TABLET | SUBLINGUAL | 3 refills | Status: AC
  Filled 2024-12-16: qty 25, 10d supply, fill #0

## 2024-12-14 MED ORDER — ALFUZOSIN HCL ER 10 MG PO TB24: 10.0000 mg | ORAL_TABLET | Freq: Every day | ORAL | 3 refills | Status: AC

## 2024-12-14 MED ORDER — POTASSIUM CHLORIDE CRYS ER 20 MEQ PO TBCR
20.0000 meq | EXTENDED_RELEASE_TABLET | Freq: Once | ORAL | Status: AC
  Administered 2024-12-14: 20 meq via ORAL
  Filled 2024-12-14: qty 1

## 2024-12-14 MED ORDER — HYDRALAZINE HCL 25 MG PO TABS
25.0000 mg | ORAL_TABLET | Freq: Three times a day (TID) | ORAL | Status: DC
  Administered 2024-12-14 – 2024-12-16 (×7): 25 mg via ORAL
  Filled 2024-12-14 (×7): qty 1

## 2024-12-14 MED ORDER — ALFUZOSIN HCL ER 10 MG PO TB24
10.0000 mg | ORAL_TABLET | Freq: Every day | ORAL | Status: DC
  Administered 2024-12-14 – 2024-12-16 (×3): 10 mg via ORAL
  Filled 2024-12-14 (×3): qty 1

## 2024-12-14 MED ORDER — ONDANSETRON HCL 4 MG/2ML IJ SOLN: 4.0000 mg | Freq: Four times a day (QID) | INTRAMUSCULAR | Status: DC | PRN

## 2024-12-14 MED ORDER — HYDRALAZINE HCL 25 MG PO TABS: ORAL_TABLET | ORAL | 3 refills | Status: DC

## 2024-12-14 MED ORDER — MELATONIN 3 MG PO TABS: 3.0000 mg | ORAL_TABLET | Freq: Every evening | ORAL | Status: DC | PRN

## 2024-12-14 MED ORDER — ISOSORBIDE MONONITRATE ER 60 MG PO TB24: 60.0000 mg | ORAL_TABLET | Freq: Two times a day (BID) | ORAL | 3 refills | Status: AC

## 2024-12-14 NOTE — Progress Notes (Signed)
" °   12/14/24 0000  Assess: MEWS Score  Temp 98 F (36.7 C)  BP (!) 207/82  MAP (mmHg) 115  Pulse Rate 75  ECG Heart Rate 74  Resp 18  Level of Consciousness Alert  SpO2 97 %  O2 Device Room Air  Patient Activity (if Appropriate) In bed  Assess: MEWS Score  MEWS Temp 0  MEWS Systolic 2  MEWS Pulse 0  MEWS RR 0  MEWS LOC 0  MEWS Score 2  MEWS Score Color Yellow  Assess: if the MEWS score is Yellow or Red  Were vital signs accurate and taken at a resting state? Yes  Does the patient meet 2 or more of the SIRS criteria? No  MEWS guidelines implemented  Yes, yellow  Treat  MEWS Interventions Considered administering scheduled or prn medications/treatments as ordered  Take Vital Signs  Increase Vital Sign Frequency  Yellow: Q2hr x1, continue Q4hrs until patient remains green for 12hrs  Escalate  MEWS: Escalate Yellow: Discuss with charge nurse and consider notifying provider and/or RRT  Notify: Charge Nurse/RN  Name of Charge Nurse/RN Notified April, RN  Assess: SIRS CRITERIA  SIRS Temperature  0  SIRS Respirations  0  SIRS Pulse 0  SIRS WBC 0  SIRS Score Sum  0    "

## 2024-12-14 NOTE — Discharge Instructions (Signed)

## 2024-12-14 NOTE — Progress Notes (Signed)
 " PROGRESS NOTE    SHIVAN HODES  FMW:[id number] DOB: 28-Apr-1941 DOA: 12/13/2024 PCP: Sebastian Beverley NOVAK, MD  Chief Complaint: sob   HPI: ADYNN CASERES is a 84 y.o. male with medical history significant for chronic diastolic heart failure, essential hypertension, hyperlipidemia, who is admitted to [facility] on 12/13/2024 by way of transfer from [facility] with acute on chronic diastolic heart failure after presenting from home to the latter facility complaining of shortness of breath.   The patient reports 3 weeks of progressive shortness of breath associated with increased edema in the bilateral lower extremities over that timeframe.  Additionally, over the last few weeks, he notes a 15 pound unintentional weight gain.  Not associate with any chest pain, palpitations, diaphoresis.  He also denies any associated subjective fever, chills, rigors, or generalized myalgias, nor any recent hemoptysis.  No recent abdominal pain, acute back pain, nausea, vomiting.  No recent acute focal weakness or any acute focal numbness, paresthesias.    He has a history of chronic diastolic heart failure, with most recent echocardiogram in June 2022 notable for LVEF 60 to 65%, no focal wall motion abnormalities, mild concentric LVH, grade 1 diastolic dysfunction, normal right ventricular systolic function, trivial mitral digitation and trivial aortic regurgitation.  Not currently on any diuretic medications as an outpatient.   He also has a history of essential hypertension, for which she is on scheduled telmisartan , Imdur  as an outpatient.  Additionally, he is on prn hydralazine  for elevated blood pressure and takes alfuzoin in the context of a history of BPH.  He conveys that he has been working with his PCP to improve blood pressure control, with most recent addition to his antihypertensive regimen being addition of minoxidil  approximately a month ago.   Additional prior cardiac workup includes  Myoview  stress test in September 2022 which showed no evidence of reversible ischemia.   Subjective: Wife and son in the room.  Patient overall feels better.  Has had a good urine output.  Son has many questions about his blood pressure.  He is currently on a nitroglycerin  drip and will should probably try to wean that.  Hospital Course:       Assessment and Plan:   #) Acute on chronic diastolic heart failure    -Lasix  40 mg IV every 12 I's and O's: Input not measured.  He had 1800 mL urine out yesterday for good diuresis. Echocardiogram ordered Cardiology consulted Troponins flat suggesting secondary to heart failure   #) Essential Hypertension:   - BP acceptable for now.  This morning 142/67 with MAP 88. -Continue ARB -Continue hydralazine  -Nitroglycerin  drip    #) Vague right midlung opacity: Today's chest x-ray, 2 views, was read by radiology as demonstrating a vague right midlung opacity, of unclear significance, with associated radiology recommendation for follow-up chest x-ray to evaluate for resolution Procalcitonin less than 0.10.  Low likelihood of bacterial pneumonia     #) Hyperlipidemia:  continue home statin.                        DVT prophylaxis: SCD's  - add Lovenox  Family Communication: none Disposition Plan: Per Rounding Team Consults called: Cardiology Admission status: inpatient    DVT prophylaxis: SCDs Start: 12/14/24 0023  Lovenox    Objective: Vitals:   12/13/24 2200 12/14/24 0000 12/14/24 0100 12/14/24 0315  BP: (!) 196/75 (!) 207/82 (!) 151/61 (!) 149/63  Pulse: 64 75 72 (!)  54  Resp: 15 18 14 16   Temp:  98 F (36.7 C)    TempSrc:  Oral    SpO2: 98% 97% 97% 94%  Weight:  82.9 kg  82.9 kg  Height:  5' 6 (1.676 m)      Intake/Output Summary (Last 24 hours) at 12/14/2024 0727 Last data filed at 12/13/2024 2006 Gross per 24 hour  Intake --  Output 1800 ml  Net -1800 ml   Filed Weights   12/13/24 1640 12/14/24 0000  12/14/24 0315  Weight: 88.1 kg 82.9 kg 82.9 kg    Examination:  Awake alert comfortable sitting upright in the bed No JVD Regular rate and rhythm.  I cannot hear an S3 or S4.  No murmurs rubs gallops Lungs few scattered crackles at the bases but otherwise clear Abdomen: Soft nontender nondistended Extremities: No cyanosis clubbing or edema Neurologic: Grossly nonfocal    Data Reviewed: I have personally reviewed following labs and imaging studies  CBC: Recent Labs  Lab 12/13/24 1650 12/14/24 0219  WBC 6.5 6.3  NEUTROABS  --  4.1  HGB 13.5 12.8*  HCT 40.5 39.0  MCV 85.1 85.9  PLT 131* 124*   Basic Metabolic Panel: Recent Labs  Lab 12/13/24 1650 12/14/24 0219  NA 140 140  K 4.0 4.0  CL 105 102  CO2 25 28  GLUCOSE 124* 180*  BUN 16 21  CREATININE 1.16 1.30*  CALCIUM  9.6 9.1  MG  --  2.4   GFR: Estimated Creatinine Clearance: 43.5 mL/min (A) (by C-G formula based on SCr of 1.3 mg/dL (H)). Liver Function Tests: Recent Labs  Lab 12/14/24 0219  AST 22  ALT 17  ALKPHOS 70  BILITOT 0.5  PROT 6.6  ALBUMIN 4.1   No results for input(s): LIPASE, AMYLASE in the last 168 hours. No results for input(s): AMMONIA in the last 168 hours. Coagulation Profile: No results for input(s): INR, PROTIME in the last 168 hours. Cardiac Enzymes: No results for input(s): CKTOTAL, CKMB, CKMBINDEX, TROPONINI in the last 168 hours. ProBNP, BNP (last 5 results) Recent Labs    12/13/24 1650 12/14/24 0219  PROBNP 1,260.0* 1,416.0*   HbA1C: No results for input(s): HGBA1C in the last 72 hours. CBG: No results for input(s): GLUCAP in the last 168 hours. Lipid Profile: No results for input(s): CHOL, HDL, LDLCALC, TRIG, CHOLHDL, LDLDIRECT in the last 72 hours. Thyroid  Function Tests: Recent Labs    12/14/24 0219  TSH 2.610  FREET4 1.56   Anemia Panel: No results for input(s): VITAMINB12, FOLATE, FERRITIN, TIBC, IRON, RETICCTPCT  in the last 72 hours. Sepsis Labs: Recent Labs  Lab 12/14/24 0219  PROCALCITON <0.10    Recent Results (from the past 240 hours)  MRSA Next Gen by PCR, Nasal     Status: None   Collection Time: 12/14/24 12:08 AM   Specimen: Nasal Mucosa; Nasal Swab  Result Value Ref Range Status   MRSA by PCR Next Gen NOT DETECTED NOT DETECTED Final    Comment: (NOTE) The GeneXpert MRSA Assay (FDA approved for NASAL specimens only), is one component of a comprehensive MRSA colonization surveillance program. It is not intended to diagnose MRSA infection nor to guide or monitor treatment for MRSA infections. Test performance is not FDA approved in patients less than 36 years old. Performed at [facility], [address], [place], [place] [address]      Radiology Studies: CT Head Wo Contrast Result Date: 12/13/2024 CLINICAL DATA:  Headache history of  bladder cancer EXAM: CT HEAD WITHOUT CONTRAST TECHNIQUE: Contiguous axial images were obtained from the base of the skull through the vertex without intravenous contrast. RADIATION DOSE REDUCTION: This exam was performed according to the departmental dose-optimization program which includes automated exposure control, adjustment of the mA and/or kV according to patient size and/or use of iterative reconstruction technique. COMPARISON:  MRI 06/12/2024 FINDINGS: Brain: No acute territorial infarction, hemorrhage or intracranial mass is visualized. Mild atrophy. Moderate white matter hypodensity consistent with chronic small vessel disease. The ventricles are nonenlarged Vascular: No hyperdense vessels.  Carotid vascular calcification Skull: Normal. Negative for fracture or focal lesion. Sinuses/Orbits: No acute finding. Other: None IMPRESSION: 1. No CT evidence for acute intracranial abnormality. 2. Atrophy and chronic small vessel ischemic changes of the white matter. Electronically Signed   By: Luke Bun M.D.   On: 12/13/2024 22:49   DG Chest 2  View Result Date: 12/13/2024 EXAM: 2 VIEW(S) XRAY OF THE CHEST 12/13/2024 05:04:43 PM COMPARISON: 04/21/2021 CLINICAL HISTORY: Shortness of breath, weight gain. FINDINGS: LUNGS AND PLEURA: Vague right mid lung opacity. No pleural effusion. No pneumothorax. HEART AND MEDIASTINUM: Aortic atherosclerosis. No acute abnormality of the cardiac and mediastinal silhouettes. BONES AND SOFT TISSUES: Thoracic spondylosis. IMPRESSION: 1. Vague right mid lung opacity; recommend follow-up chest radiograph to document resolution and exclude an underlying lesion. Electronically signed by: Oneil Devonshire MD 12/13/2024 05:40 PM EST RP Workstation: [id number]    Scheduled Meds:  alfuzosin   10 mg Oral Q breakfast   aspirin  EC  81 mg Oral Daily   furosemide   40 mg Intravenous BID   hydrALAZINE   25 mg Oral Q8H   irbesartan   300 mg Oral Daily   rosuvastatin   5 mg Oral Daily   Continuous Infusions:  nitroGLYCERIN  15 mcg/min (12/14/24 0333)     LOS: 1 day   Time spent: 30 minutes  Lonni KANDICE Moose, MD  [facility]  12/14/2024, 7:27 AM   "

## 2024-12-14 NOTE — TOC CM/SW Note (Signed)
 Transition of Care [facility]) - Inpatient Brief Assessment   Patient Details  Name: Ricardo [name] MRN: [id number] Date of Birth: Dec 06, 1940  Transition of Care [facility]) CM/SW Contact:    Roxie KANDICE Stain, RN Phone Number: 12/14/2024, 12:35 PM   Clinical Narrative:  Patient admitted for CHF exacerbation, IV Lasix .  On a nitroglycerin  drip. From home with wife.  Independent prior to admission. SDOH resources added to AVS. Inpatient case management will continue to follow for needs.  Transition of Care Asessment: Insurance and Status: Insurance coverage has been reviewed Patient has primary care physician: Yes Home environment has been reviewed: Safe to discharge home Prior level of function:: Independent Prior/Current Home Services: No current home services Social Drivers of Health Review: SDOH reviewed interventions complete Readmission risk has been reviewed: Yes Transition of care needs: no transition of care needs at this time

## 2024-12-14 NOTE — Telephone Encounter (Signed)
 Patient Advocate Encounter   The patient was approved for a [employer] grant that will help cover the cost of REPATHA  Total amount awarded, 2500.  Effective: 11/14/24 - 11/13/25   APW:[id number] ERW:[id number] Hmnle:[id number] PI:[id number] [employer] ID: [id number]   Pharmacy provided with approval and processing information. Patient informed via [facility]

## 2024-12-14 NOTE — Telephone Encounter (Signed)
 dup

## 2024-12-14 NOTE — Plan of Care (Signed)
  Problem: Clinical Measurements: Goal: Respiratory complications will improve Outcome: Progressing   Problem: Clinical Measurements: Goal: Cardiovascular complication will be avoided Outcome: Progressing   Problem: Activity: Goal: Risk for activity intolerance will decrease Outcome: Progressing   Problem: Safety: Goal: Ability to remain free from injury will improve Outcome: Progressing   

## 2024-12-14 NOTE — Telephone Encounter (Signed)
 Pharmacy Patient Advocate Encounter  Insurance verification completed.    The patient is insured through [facility]. Patient has Medicare and is not eligible for a copay card, but may be able to apply for patient assistance or Medicare RX Payment Plan (Patient Must reach out to their plan, if eligible for payment plan), if available.    Ran test claim for Farxiga 10mg  tablet and the current 30 day co-pay is $38.29.  Ran test claim for Jardiance 10mg  tablet and the current 30 day co-pay is $42.96.  This test claim was processed through [facility]- copay amounts may vary at other pharmacies due to pharmacy/plan contracts, or as the patient moves through the different stages of their insurance plan.

## 2024-12-14 NOTE — Progress Notes (Signed)
" ° °  Heart Failure Stewardship Pharmacist Progress Note   PCP: Sebastian Beverley NOVAK, MD PCP-Cardiologist: Darryle ONEIDA Decent, MD    HPI:  84 year old male with a PMH significant for HFpEF (last EF 60-65% in 2022), HTN, HLD who was admitted on 12/13/24 from [facility] for progressive SOB and increased bilateral lower extremity edema over the past 3 weeks. He also reported an unintentional 15-lb weight gain over the past few weeks. CXR showed vague right mid-lung opacity. CT head was negative for acute intracranial abnormality.   Patient is doing well today and is accompanied by his son and granddaughter. He denies SOB and chest pain. He has lower extremity edema present on physical exam. Patient reports working with his PCP to try to get his BP better controlled. His granddaughter stated that he has had spells of hypertension over the past few months that cause him to get lethargic. I recommended him check his blood pressure daily at home and weights in a log to bring to his appointments. I reached out to the attending to recommend resuming his PTA Imdur  to titrate him off of the nitroglycerin  drip and to initiate Farxiga. MD stated cardiology is following and will make medication changes. Patient stated he was getting Repatha  through a grant, and would like to be re-enrolled if possible. Will follow-up ECHO today.  Current HF Medications: Diuretic: IV furosemide  40 mg BID ACE/ARB/ARNI: irbesartan  300 mg daily Other: hydralazine  25 mg Q8hr  Prior to admission HF Medications: ACE/ARB/ARNI: telmisartan  80 mg daily Other: hydralazine  25 mg BID + 1 additional tablet PRN if BP elevated  Pertinent Lab Values: Serum creatinine 1.3 (up from 1.16), BUN 21, Potassium 4, Sodium 140, proBNP 1,416, Magnesium 2.4, A1c 6.2%  Vital Signs: Weight: 182 lbs (admission weight: 194 lbs) Blood pressure: 130s-150s/60s  Heart rate: 50s-70s  I/O: net -1.8 L yesterday; net -1.6 L since admission  Medication  Assistance / Insurance Benefits Check: Does the patient have prescription insurance?  Yes Type of insurance plan: [employer] Medicare Part D  Does the patient qualify for medication assistance through manufacturers or grants?   Yes Eligible grants and/or patient assistance programs: [facility] Medication assistance applications in progress: none   Medication assistance applications approved: none Approved medication assistance renewals will be completed by: [facility]  Outpatient Pharmacy:  Prior to admission outpatient pharmacy: [facility] Is the patient willing to use [facility] TOC pharmacy at discharge? Yes Is the patient willing to transition their outpatient pharmacy to utilize a [facility] outpatient pharmacy?   Yes    Assessment: 1. 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 NYHA class II symptoms. - Lower extremity edema on physical exam > continue IV Lasix  40 mg BID - BP elevated and currently on a nitroglycerin  drip, irbesartan  300 mg daily and hydralazine  25 mg Q8hr > recommend transitioning back to PTA Imdur   - Scr trending up > would hold on MRA for now - HR has been soft > hold on beta blocker for now Plan: 1) Medication changes recommended at this time: - Transition nitroglycerin  infusion to Imdur  60 mg BID - Consider initiating Farxiga 10 mg daily  2) Patient assistance: - Jardiance copay is $42.96 - Doreen copay is $38.29   3)  Education  - To be completed prior to discharge  B. Maegan Meli Faley, PharmD PGY-1 Pharmacy Resident [facility] 12/14/2024 9:17 AM    "

## 2024-12-14 NOTE — H&P (Signed)
 " History and Physical      Ricardo [name] FMW:[id number] DOB: 13-Jul-1941 DOA: 12/13/2024; DOS: 12/14/2024  PCP: Sebastian Beverley NOVAK, MD  Patient coming from: home   I have personally briefly reviewed patient's old medical records in [facility]  Chief Complaint: sob  HPI: Ricardo [name] is a 84 y.o. male with medical history significant for chronic diastolic heart failure, essential hypertension, hyperlipidemia, who is admitted to [facility] on 12/13/2024 by way of transfer from [facility] with acute on chronic diastolic heart failure after presenting from home to the latter facility complaining of shortness of breath.  The patient reports 3 weeks of progressive shortness of breath associated with increased edema in the bilateral lower extremities over that timeframe.  Additionally, over the last few weeks, he notes a 15 pound unintentional weight gain.  Not associate with any chest pain, palpitations, diaphoresis.  He also denies any associated subjective fever, chills, rigors, or generalized myalgias, nor any recent hemoptysis.  No recent abdominal pain, acute back pain, nausea, vomiting.  No recent acute focal weakness or any acute focal numbness, paresthesias.   He has a history of chronic diastolic heart failure, with most recent echocardiogram in June 2022 notable for LVEF 60 to 65%, no focal wall motion abnormalities, mild concentric LVH, grade 1 diastolic dysfunction, normal right ventricular systolic function, trivial mitral digitation and trivial aortic regurgitation.  Not currently on any diuretic medications as an outpatient.  He also has a history of essential hypertension, for which she is on scheduled telmisartan , Imdur  as an outpatient.  Additionally, he is on prn hydralazine  for elevated blood pressure and takes alfuzoin in the context of a history of BPH.  He conveys that he has been working with his PCP to improve blood pressure control, with most recent  addition to his antihypertensive regimen being addition of minoxidil  approximately a month ago.  Additional prior cardiac workup includes Myoview  stress test in September 2022 which showed no evidence of reversible ischemia.     [facility] ED Course:  Vital signs in the ED were notable for the following: Afebrile; rates in the 60s to 80s; systolic pressures in the 190s to low 200s, refractory to multiple doses of IV antihypertensives and initiation of IV Lasix  in the ED, as further detailed below; respiratory rate 14-23, oxygen  saturation 96 to 100% on room air.  Labs were notable for the following: BMP was notable for the following: Sodium 140, potassium 4.0, bicarbonate 25, creatinine 1.16 compared to most recent prior value of 1.03 on 08/09/2024, glucose 124.  proBNP 1260, without any prior proBNP data points available for point comparison.  High sensitive troponin T was initially 27, with repeat trending up slightly to 28.  CBC notable for white cell count 6500, hemoglobin 13.5.  Per my interpretation, EKG in ED demonstrated the following: EKG shows sinus rhythm with heart rate 84, normal intervals, no evidence of T wave or ST changes, including no evidence of ST elevation.  Imaging in the ED, per corresponding formal radiology read, was notable for the following: 2 view chest x-ray showed a vague right middle lobe opacity, of unclear etiology, with radiology recommending follow-up chest x-ray to evaluate for interval resolution, with today's chest x-ray showing no overt evidence of infiltrate edema, or effusion.  CT head in the setting of patient's elevated blood pressure, showed no evidence of acute intracranial process, including no evidence of intracranial hemorrhage or any evidence of acute  infarct.  EDP discussed patient's case with on-call cardiology, Dr. Gail, who recommended hospitalist admission for further evaluation/management of CHF, including additional IV diuresis,  afterload reduction, and pursuit of updated echocardiogram.  While in the ED, the following were administered: Lasix  60 mg IV x 1 dose, hydralazine  10 mg IV x 2 doses, acetaminophen  1 g p.o. x 1.  Subsequently, the patient was admitted to [facility] for further evaluation and management of presenting acute on chronic diastolic heart failure.      Review of Systems: As per HPI otherwise 10 point review of systems negative.   Past Medical History:  Diagnosis Date   AAA (abdominal aortic aneurysm)    Abdominal aneurysm 08/30/2018   Arthritis    SHOULDERS, NECK , RIGHT HIP   Bladder cancer (HCC) DX   OCT 2011--  FOLLOWED BY DR WOODRUFF   S/P TURBT,  HIGH GRADE BLADDER CANCER   CAD (coronary artery disease), native coronary artery 08/30/2018   normal Left main, 30% stenosis ostial LAD, 50% stenosis OM 3, 80% stenosis mid RCA;  2.5 x 8 mm Tetra Stent Dr. Claudene   Coronary artery disease CARDIOLOGIST- DR BLANCA-  LAST VISIT 2 WKS AGO-- WILL REQUEST NOTE AND EKG   STRESS TEST -- JULY 2009   Essential hypertension 08/30/2018   Hyperlipidemia    Hypertension    Nocturia    Personal history of malignant neoplasm of bladder 08/30/2018   Post PTCA 2001-   X1 STENT TO RCA   AND 1996    Past Surgical History:  Procedure Laterality Date   CORONARY ANGIOPLASTY  1996   CORONARY ANGIOPLASTY WITH STENT PLACEMENT  2001   STENT TO RCA   CYSTO/ BLADDER BX  10-22-10   CYSTOSCOPY W/ RETROGRADES  11/12/2011   Procedure: CYSTOSCOPY WITH RETROGRADE PYELOGRAM;  Surgeon: Toribio Neysa Repine, MD;  Location: [facility];  Service: Urology;  Laterality: Bilateral;   LEFT HEART CATH AND CORONARY ANGIOGRAPHY N/A 07/09/2020   Procedure: LEFT HEART CATH AND CORONARY ANGIOGRAPHY;  Surgeon: Claudene Pacific, MD;  Location: [facility] INVASIVE CV LAB;  Service: Cardiovascular;  Laterality: N/A;   LUMBAR FUSION     TRANSURETHRAL RESECTION OF BLADDER TUMOR  08-27-10   RIGHT ANTERIOR DOME   TRANSURETHRAL  RESECTION OF BLADDER TUMOR  11/12/2011   Procedure: TRANSURETHRAL RESECTION OF BLADDER TUMOR (TURBT);  Surgeon: Toribio Neysa Repine, MD;  Location: [facility];  Service: Urology;  Laterality: N/A;  with PK Gyrus c-arm gyrus  digital ureteroscope    Social History:  reports that he quit smoking about 59 years ago. His smoking use included cigarettes. He started smoking about 64 years ago. He has a 7.5 pack-year smoking history. He has been exposed to tobacco smoke. He has never used smokeless tobacco. He reports that he does not drink alcohol and does not use drugs.   Allergies[1]  Family History  Problem Relation Age of Onset   Diabetes Mother    Hypertension Mother     Family history reviewed and not pertinent    Prior to Admission medications  Medication Sig Start Date End Date Taking? Authorizing Provider  acetaminophen  (TYLENOL ) 500 MG tablet Take 1,000 mg by mouth daily as needed for headache (pain).    [provider]  alfuzosin  (UROXATRAL ) 10 MG 24 hr tablet Take 1 tablet (10 mg total) by mouth daily with breakfast. 08/23/24 08/18/25  Sebastian Beverley NOVAK, MD  aspirin  81 MG tablet Take 1 tablet (81 mg total) by  mouth daily. 11/16/11   Eliza Sieving, MD  hydrALAZINE  (APRESOLINE ) 25 MG tablet Take 1 tablet (25 mg total) by mouth 2 (two) times daily. Ok to take 1 tablet as needed for blood pressure greater than 170 systolic. 08/18/24   Walker, Caitlin S, NP  isosorbide  mononitrate (IMDUR ) 60 MG 24 hr tablet Take 1 tablet (60 mg total) by mouth in the morning and at bedtime. 07/20/24   Walker, Caitlin S, NP  MYRBETRIQ 25 MG TB24 tablet Take 25 mg by mouth daily. 06/21/24   [provider]  nitroGLYCERIN  (NITROSTAT ) 0.4 MG SL tablet Place 1 tablet (0.4 mg total) under the tongue every 5 (five) minutes as needed. 08/18/24 12/13/24  Vannie Reche RAMAN, NP  REPATHA  SURECLICK 140 MG/ML SOAJ INJECT 140 MILLIGRAM INTO THE SKIN ON THE 1ST AND 15TH OF EACH MONTH  12/09/24   West, Katlyn D, NP  rosuvastatin  (CRESTOR ) 5 MG tablet Take 1 tablet (5 mg total) by mouth daily. 09/30/21   O'NealDarryle Ned, MD  telmisartan  (MICARDIS ) 80 MG tablet Take 1 tablet (80 mg total) by mouth daily. 04/13/24   Barbaraann Darryle Ned, MD     Objective    Physical Exam: Vitals:   12/13/24 2115 12/13/24 2145 12/13/24 2200 12/14/24 0000  BP: (!) 205/87 (!) 192/74 (!) 196/75 (!) 207/82  Pulse: (!) 59 (!) 59 64 75  Resp: 20 12 15 18   Temp:    98 F (36.7 C)  TempSrc:    Oral  SpO2: 100% 97% 98% 97%  Weight:    82.9 kg  Height:    5' 6 (1.676 m)    General: appears to be stated age; alert Skin: warm, dry, no rash Head:  AT/[place] Mouth:  Oral mucosa membranes appear moist, normal dentition Neck: supple; trachea midline Heart:  RRR; did not appreciate any M/R/G Lungs: CTAB, did not appreciate any wheezes, rales, or rhonchi Abdomen: + BS; soft, ND, NT Extremities: 1-2+ edema in b/l LE's, no muscle wasting        Labs on Admission: I have personally reviewed following labs and imaging studies  CBC: Recent Labs  Lab 12/13/24 1650  WBC 6.5  HGB 13.5  HCT 40.5  MCV 85.1  PLT 131*   Basic Metabolic Panel: Recent Labs  Lab 12/13/24 1650  NA 140  K 4.0  CL 105  CO2 25  GLUCOSE 124*  BUN 16  CREATININE 1.16  CALCIUM  9.6   GFR: Estimated Creatinine Clearance: 48.7 mL/min (by C-G formula based on SCr of 1.16 mg/dL). Liver Function Tests: No results for input(s): AST, ALT, ALKPHOS, BILITOT, PROT, ALBUMIN in the last 168 hours. No results for input(s): LIPASE, AMYLASE in the last 168 hours. No results for input(s): AMMONIA in the last 168 hours. Coagulation Profile: No results for input(s): INR, PROTIME in the last 168 hours. Cardiac Enzymes: No results for input(s): CKTOTAL, CKMB, CKMBINDEX, TROPONINI in the last 168 hours. BNP (last 3 results) Recent Labs    12/13/24 1650  PROBNP 1,260.0*   HbA1C: No  results for input(s): HGBA1C in the last 72 hours. CBG: No results for input(s): GLUCAP in the last 168 hours. Lipid Profile: No results for input(s): CHOL, HDL, LDLCALC, TRIG, CHOLHDL, LDLDIRECT in the last 72 hours. Thyroid  Function Tests: No results for input(s): TSH, T4TOTAL, FREET4, T3FREE, THYROIDAB in the last 72 hours. Anemia Panel: No results for input(s): VITAMINB12, FOLATE, FERRITIN, TIBC, IRON, RETICCTPCT in the last 72 hours. Urine analysis:    Component Value  Date/Time   COLORURINE YELLOW 08/09/2024 1016   APPEARANCEUR CLEAR 08/09/2024 1016   LABSPEC 1.015 08/09/2024 1016   PHURINE 6.5 08/09/2024 1016   GLUCOSEU NEGATIVE 08/09/2024 1016   HGBUR NEGATIVE 08/09/2024 1016   BILIRUBINUR NEGATIVE 02/03/2024 1621   KETONESUR NEGATIVE 08/09/2024 1016   PROTEINUR NEGATIVE 08/09/2024 1016   NITRITE NEGATIVE 02/03/2024 1621   LEUKOCYTESUR NEGATIVE 02/03/2024 1621    Radiological Exams on Admission: CT Head Wo Contrast Result Date: 12/13/2024 CLINICAL DATA:  Headache history of bladder cancer EXAM: CT HEAD WITHOUT CONTRAST TECHNIQUE: Contiguous axial images were obtained from the base of the skull through the vertex without intravenous contrast. RADIATION DOSE REDUCTION: This exam was performed according to the departmental dose-optimization program which includes automated exposure control, adjustment of the mA and/or kV according to patient size and/or use of iterative reconstruction technique. COMPARISON:  MRI 06/12/2024 FINDINGS: Brain: No acute territorial infarction, hemorrhage or intracranial mass is visualized. Mild atrophy. Moderate white matter hypodensity consistent with chronic small vessel disease. The ventricles are nonenlarged Vascular: No hyperdense vessels.  Carotid vascular calcification Skull: Normal. Negative for fracture or focal lesion. Sinuses/Orbits: No acute finding. Other: None IMPRESSION: 1. No CT evidence for acute  intracranial abnormality. 2. Atrophy and chronic small vessel ischemic changes of the white matter. Electronically Signed   By: Luke Bun M.D.   On: 12/13/2024 22:49   DG Chest 2 View Result Date: 12/13/2024 EXAM: 2 VIEW(S) XRAY OF THE CHEST 12/13/2024 05:04:43 PM COMPARISON: 04/21/2021 CLINICAL HISTORY: Shortness of breath, weight gain. FINDINGS: LUNGS AND PLEURA: Vague right mid lung opacity. No pleural effusion. No pneumothorax. HEART AND MEDIASTINUM: Aortic atherosclerosis. No acute abnormality of the cardiac and mediastinal silhouettes. BONES AND SOFT TISSUES: Thoracic spondylosis. IMPRESSION: 1. Vague right mid lung opacity; recommend follow-up chest radiograph to document resolution and exclude an underlying lesion. Electronically signed by: Oneil Devonshire MD 12/13/2024 05:40 PM EST RP Workstation: [id number]      Assessment/Plan   Principal Problem:   Acute on chronic diastolic CHF (congestive heart failure) (HCC) Active Problems:   HLD (hyperlipidemia)   Essential hypertension   SOB (shortness of breath)   Elevated troponin       #) Acute on chronic diastolic heart failure: dx of acute decompensation on the basis of presenting 3 weeks of progressive shortness of breath associated with progression and and edema in the bilateral lower extremities, 15 pound unintentional weight gain over that timeframe, with presenting elevated proBNP. This is in the context of a known history of chronic diastolic heart failure, with most recent echocardiogram performed June 2022, which is notable for LVEF 60 to 65%, grade 1 diastolic dysfunction, and normal right ventricular systolic function, with additional results as conveyed above.   Factors potentially contributing to the patient's acute on chronic diastolic heart failure, include potential contribution from his poorly controlled blood pressure, working with outpatient provider to further optimize this.  Additionally, he is not currently on any  diuretic medications as an outpatient.   I suspect that mildly elevated initial troponin is a consequence of underlying acutely decompensated heart failure as opposed to representing ACS causing presenting acute heart failure exacerbation, particularly in the absence of any recent CP, and with presenting EKG showing no evidence of acute ischemic changes. However, will continue to evaluate with further trending of troponin and close monitoring on tele.   EDP discussed patient's case with on-call cardiology, Dr. Gail, who recommended hospitalist admission for further evaluation/management of CHF, including additional IV diuresis,  afterload reduction, and pursuit of updated echocardiogram.  Received Lasix  60 mg IV x 1 dose at [facility] this evening.  Plan: monitor strict I's & O's and daily weights. Monitor on telemetry. CMP in the morning, including for monitoring trend of potassium, bicarbonate, and renal function in response to interval diuresis efforts.  Potassium chloride  20 mill colons p.o. x 1 dose now.  Add-on serum magnesium level, and repeat this level in the AM. Trend troponin. Lasix  40 mg IV twice daily.  Echocardiogram in the morning.  Further optimization of afterload reduction with initiation of nitroglycerin  drip, in the absence of any associated chest pain, with additional afterload reduction intervention as outlined above.  Check urinary drug screen, TSH, free T4, repeat proBNP in the morning.  Hold home Myrbetriq.                          #) Essential Hypertension: documented h/o poorly controlled blood pressure, notable in the context of his presenting acute on chronic diastolic heart failure, with outpatient antihypertensive regimen including telmisartan , Imdur , as needed hydralazine , and recent addition of minoxidil .  In spite of these home medications, with which the patient reports good compliance, and in spite of multiple doses of IV hydralazine   as well as initiation of IV Lasix  and [facility] today, systolic pressures remain elevated in the 190s to 200s.  Will benefit from further afterload optimization, including from the standpoint of management of his acute on chronic diastolic heart failure.   Per chart review, his recent prior echocardiogram showed no evidence of right-sided heart failure.  No evidence to suggest right-sided MI this evening.  Will initiate nitroglycerin  drip for improvement in blood pressure control, as further detailed below.  In the context of initiation of nitroglycerin  drip, will hold home Imdur  for now.  No evidence of acute focal neurologic deficits to suggest contributory acute ischemic stroke, will noting that today's CT head showed no evidence of acute intracranial process, including no evidence of intracranial hemorrhage or evidence of acute infarct.  No report of any recent abdominal discomfort/chest pain to increase suspicion for aneurysm rupture.   Plan: Close monitoring of subsequent BP via routine VS. initiate nitroglycerin  drip for blood pressure control, titrated with goal systolic blood pressure of less than 140 mmHg. hold home Imdur  while on nitroglycerin  drip.  Resume home telmisartan .  Change home as needed hydralazine  which he has been taking twice daily to a scheduled medication on a 3 times daily basis.  Resume home Alfuzosi IV Lasix , as above.  Monitor strict I's and O's and daily weights.  Monitor on telemetry.  Check urine drug screen, free T4, TSH.  Repeat CMP in the morning.                        #) Vague right midlung opacity: Today's chest x-ray, 2 views, was read by radiology as demonstrating a vague right midlung opacity, of unclear significance, with associated radiology recommendation for follow-up chest x-ray to evaluate for resolution, will today's chest x-ray otherwise showing no evidence of infiltrate, edema, effusion, or pneumothorax.  Clinically,  presentation appears less suggestive of pneumonia, the patient may be afebrile, without any leukocytosis, no evidence of subjective fever, chills.  No known history of pulmonary malignancy.  Plan: Will convey radiology's recommendation for a follow-up chest x-ray, as above.  Add on procalcitonin level.                      #)  Hyperlipidemia: documented h/o such. On rosuvastatin  as outpatient.   Plan: continue home statin.             DVT prophylaxis: SCD's   Code Status: Full code Family Communication: none Disposition Plan: Per Rounding Team Consults called: EDP discussed patient's case with on-call cardiology, Dr. Gail, who recommended hospitalist admission for further evaluation/management of CHF, including additional IV diuresis, afterload reduction, and pursuit of updated echocardiogram.;  Admission status: inpatient     I SPENT GREATER THAN 75  MINUTES IN CLINICAL CARE TIME/MEDICAL DECISION-MAKING IN COMPLETING THIS ADMISSION.      Eva NOVAK Moe Graca DO [facility]  From 7PM - 7AM   12/14/2024, 12:41 AM         [1]  Allergies Allergen Reactions   Clonidine  Hcl Other (See Comments)    Reaction to 0.2 mg tabs - caused dry mouth, drowsiness, lightheadedness - reported 07/08/2020 (no reaction to patch)   Cyclobenzaprine Other (See Comments)    Numbness in lips   Norvasc [Amlodipine Besylate] Other (See Comments)    CAUSES TEETH TO FALL OUT   "

## 2024-12-14 NOTE — Progress Notes (Signed)
 Echocardiogram 2D Echocardiogram has been performed.  Ricardo [name] 12/14/2024, 1:50 PM

## 2024-12-15 ENCOUNTER — Other Ambulatory Visit (HOSPITAL_COMMUNITY): Payer: Self-pay

## 2024-12-15 ENCOUNTER — Telehealth: Payer: Self-pay | Admitting: Pharmacy Technician

## 2024-12-15 DIAGNOSIS — E8779 Other fluid overload: Secondary | ICD-10-CM

## 2024-12-15 DIAGNOSIS — I1A0 Resistant hypertension: Secondary | ICD-10-CM

## 2024-12-15 LAB — BASIC METABOLIC PANEL WITH GFR
Anion gap: 10 (ref 5–15)
BUN: 26 mg/dL — ABNORMAL HIGH (ref 8–23)
CO2: 25 mmol/L (ref 22–32)
Calcium: 8.9 mg/dL (ref 8.9–10.3)
Chloride: 103 mmol/L (ref 98–111)
Creatinine, Ser: 1.22 mg/dL (ref 0.61–1.24)
GFR, Estimated: 59 mL/min — ABNORMAL LOW
Glucose, Bld: 128 mg/dL — ABNORMAL HIGH (ref 70–99)
Potassium: 3.8 mmol/L (ref 3.5–5.1)
Sodium: 138 mmol/L (ref 135–145)

## 2024-12-15 LAB — MAGNESIUM: Magnesium: 2.3 mg/dL (ref 1.7–2.4)

## 2024-12-15 MED ORDER — ISOSORBIDE MONONITRATE ER 60 MG PO TB24
60.0000 mg | ORAL_TABLET | Freq: Two times a day (BID) | ORAL | Status: DC
  Administered 2024-12-15 – 2024-12-16 (×2): 60 mg via ORAL
  Filled 2024-12-15 (×2): qty 1

## 2024-12-15 MED ORDER — SPIRONOLACTONE 25 MG PO TABS
25.0000 mg | ORAL_TABLET | Freq: Every day | ORAL | Status: DC
  Administered 2024-12-15 – 2024-12-16 (×2): 25 mg via ORAL
  Filled 2024-12-15 (×2): qty 1

## 2024-12-15 NOTE — Plan of Care (Signed)

## 2024-12-15 NOTE — Progress Notes (Signed)
 " PROGRESS NOTE    Ricardo [name]  FMW:[id number] DOB: January 02, 1941 DOA: 12/13/2024 PCP: Sebastian Beverley NOVAK, MD  Chief Complaint: sob   HPI: Ricardo [name] is a 84 y.o. male with medical history significant for chronic diastolic heart failure, essential hypertension, hyperlipidemia, who is admitted to [facility] on 12/13/2024 by way of transfer from [facility] with acute on chronic diastolic heart failure after presenting from home to the latter facility complaining of shortness of breath.   The patient reports 3 weeks of progressive shortness of breath associated with increased edema in the bilateral lower extremities over that timeframe.  Additionally, over the last few weeks, he notes a 15 pound unintentional weight gain.  Not associate with any chest pain, palpitations, diaphoresis.  He also denies any associated subjective fever, chills, rigors, or generalized myalgias, nor any recent hemoptysis.  No recent abdominal pain, acute back pain, nausea, vomiting.  No recent acute focal weakness or any acute focal numbness, paresthesias.    He has a history of chronic diastolic heart failure, with most recent echocardiogram in June 2022 notable for LVEF 60 to 65%, no focal wall motion abnormalities, mild concentric LVH, grade 1 diastolic dysfunction, normal right ventricular systolic function, trivial mitral digitation and trivial aortic regurgitation.  Not currently on any diuretic medications as an outpatient.   He also has a history of essential hypertension, for which she is on scheduled telmisartan , Imdur  as an outpatient.  Additionally, he is on prn hydralazine  for elevated blood pressure and takes alfuzoin in the context of a history of BPH.  He conveys that he has been working with his PCP to improve blood pressure control, with most recent addition to his antihypertensive regimen being addition of minoxidil  approximately a month ago.   Additional prior cardiac workup includes  Myoview  stress test in September 2022 which showed no evidence of reversible ischemia.   Subjective: Daughter, grandson, and grandson's girlfriend in the room.  Patient overall continues to feel better.  Has had a good urine output.  Continues on nitroglycerin  drip.  Discussed with cardiology to try to wean that.  Patient tells me minoxidil  made him swell up.  Hospital Course:       Assessment and Plan:   #) Acute on chronic diastolic heart failure    -Lasix  40 mg IV every 12 I's and O's: Input not measured.  He had 1800 mL urine out yesterday for good diuresis. Echocardiogram ordered Cardiology consulted Troponins flat suggesting secondary to heart failure   #) Essential Hypertension:   - BP acceptable for now.  This morning 142/67 with MAP 88. -Continue ARB -Continue hydralazine  -Nitroglycerin  drip as tolerated    #) Vague right midlung opacity: Today's chest x-ray, 2 views, was read by radiology as demonstrating a vague right midlung opacity, of unclear significance, with associated radiology recommendation for follow-up chest x-ray to evaluate for resolution Procalcitonin less than 0.10.  Low likelihood of bacterial pneumonia     #) Hyperlipidemia:  continue home statin.                     DVT prophylaxis: SCD's   Lovenox  Family Communication: Discussed with daughter. Disposition Plan: Per Rounding Team Consults called: Cardiology Admission status: inpatient    DVT prophylaxis: enoxaparin  (LOVENOX ) injection 40 mg Start: 12/14/24 1000 SCDs Start: 12/14/24 0023  Lovenox    Objective: Vitals:   12/15/24 0515 12/15/24 0545 12/15/24 0742 12/15/24 1136  BP: (!) 134/56 (!) 117/52  125/68 (!) 148/72  Pulse: (!) 56 (!) 55 78 88  Resp: 14 15 13 20   Temp:   98.3 F (36.8 C) 97.9 F (36.6 C)  TempSrc:   Oral Oral  SpO2: 93% 94% 96% 93%  Weight:      Height:        Intake/Output Summary (Last 24 hours) at 12/15/2024 1427 Last data filed at 12/15/2024  1137 Gross per 24 hour  Intake --  Output 1950 ml  Net -1950 ml   Filed Weights   12/13/24 1640 12/14/24 0000 12/14/24 0315  Weight: 88.1 kg 82.9 kg 82.9 kg    Examination:  Awake alert comfortable sitting upright in the bed No JVD Regular rate and rhythm.  I cannot hear an S3 or S4.  No murmurs rubs gallops Lungs are clear Abdomen: Soft nontender nondistended Extremities: No cyanosis clubbing or edema Neurologic: Grossly nonfocal    Data Reviewed: I have personally reviewed following labs and imaging studies  CBC: Recent Labs  Lab 12/13/24 1650 12/14/24 0219  WBC 6.5 6.3  NEUTROABS  --  4.1  HGB 13.5 12.8*  HCT 40.5 39.0  MCV 85.1 85.9  PLT 131* 124*   Basic Metabolic Panel: Recent Labs  Lab 12/13/24 1650 12/14/24 0219 12/15/24 0905  NA 140 140 138  K 4.0 4.0 3.8  CL 105 102 103  CO2 25 28 25   GLUCOSE 124* 180* 128*  BUN 16 21 26*  CREATININE 1.16 1.30* 1.22  CALCIUM  9.6 9.1 8.9  MG  --  2.4 2.3   GFR: Estimated Creatinine Clearance: 46.3 mL/min (by C-G formula based on SCr of 1.22 mg/dL). Liver Function Tests: Recent Labs  Lab 12/14/24 0219  AST 22  ALT 17  ALKPHOS 70  BILITOT 0.5  PROT 6.6  ALBUMIN 4.1   No results for input(s): LIPASE, AMYLASE in the last 168 hours. No results for input(s): AMMONIA in the last 168 hours. Coagulation Profile: No results for input(s): INR, PROTIME in the last 168 hours. Cardiac Enzymes: No results for input(s): CKTOTAL, CKMB, CKMBINDEX, TROPONINI in the last 168 hours. ProBNP, BNP (last 5 results) Recent Labs    12/13/24 1650 12/14/24 0219  PROBNP 1,260.0* 1,416.0*   HbA1C: No results for input(s): HGBA1C in the last 72 hours. CBG: No results for input(s): GLUCAP in the last 168 hours. Lipid Profile: No results for input(s): CHOL, HDL, LDLCALC, TRIG, CHOLHDL, LDLDIRECT in the last 72 hours. Thyroid  Function Tests: Recent Labs    12/14/24 0219  TSH 2.610   FREET4 1.56   Anemia Panel: No results for input(s): VITAMINB12, FOLATE, FERRITIN, TIBC, IRON, RETICCTPCT in the last 72 hours. Sepsis Labs: Recent Labs  Lab 12/14/24 0219  PROCALCITON <0.10    Recent Results (from the past 240 hours)  MRSA Next Gen by PCR, Nasal     Status: None   Collection Time: 12/14/24 12:08 AM   Specimen: Nasal Mucosa; Nasal Swab  Result Value Ref Range Status   MRSA by PCR Next Gen NOT DETECTED NOT DETECTED Final    Comment: (NOTE) The GeneXpert MRSA Assay (FDA approved for NASAL specimens only), is one component of a comprehensive MRSA colonization surveillance program. It is not intended to diagnose MRSA infection nor to guide or monitor treatment for MRSA infections. Test performance is not FDA approved in patients less than 84 years old. Performed at [facility], [address], [place], [place] [address]      Radiology Studies: ECHOCARDIOGRAM COMPLETE Result  Date: 12/14/2024    ECHOCARDIOGRAM REPORT   Patient Name:   Kylyn A [facility] Date of Exam: 12/14/2024 Medical Rec #:  [id number]      Height:       66.0 in Accession #:    [id number]     Weight:       182.8 lb Date of Birth:  Sep 04, 1941      BSA:          1.925 m Patient Age:    83 years       BP:           150/65 mmHg Patient Gender: M              HR:           77 bpm. Exam Location:  Inpatient Procedure: 2D Echo, Cardiac Doppler and Color Doppler (Both Spectral and Color            Flow Doppler were utilized during procedure). Indications:    CHF - Acute Diastolic  History:        Patient has prior history of Echocardiogram examinations, most                 recent 05/12/2021. CHF, CAD and Angina, Arrythmias:Bradycardia,                 Signs/Symptoms:Shortness of Breath and Chest Pain; Risk                 Factors:Dyslipidemia, Hypertension and Former Smoker.  Sonographer:    Juliene Rucks Referring Phys: [id number] JUSTIN B HOWERTER IMPRESSIONS  1. Left ventricular ejection fraction, by  estimation, is 60 to 65%. The left ventricle has normal function. The left ventricle has no regional wall motion abnormalities. There is mild concentric left ventricular hypertrophy. Left ventricular diastolic parameters are consistent with Grade I diastolic dysfunction (impaired relaxation).  2. Right ventricular systolic function is normal. The right ventricular size is normal. Tricuspid regurgitation signal is inadequate for assessing PA pressure.  3. The mitral valve is normal in structure. No evidence of mitral valve regurgitation. No evidence of mitral stenosis.  4. The aortic valve is tricuspid. There is moderate calcification of the aortic valve. Aortic valve regurgitation is not visualized. Aortic valve sclerosis/calcification is present, without any evidence of aortic stenosis. FINDINGS  Left Ventricle: Left ventricular ejection fraction, by estimation, is 60 to 65%. The left ventricle has normal function. The left ventricle has no regional wall motion abnormalities. The left ventricular internal cavity size was normal in size. There is  mild concentric left ventricular hypertrophy. Left ventricular diastolic parameters are consistent with Grade I diastolic dysfunction (impaired relaxation). Right Ventricle: The right ventricular size is normal. No increase in right ventricular wall thickness. Right ventricular systolic function is normal. Tricuspid regurgitation signal is inadequate for assessing PA pressure. Left Atrium: Left atrial size was normal in size. Right Atrium: Right atrial size was normal in size. Pericardium: There is no evidence of pericardial effusion. Mitral Valve: The mitral valve is normal in structure. Mild mitral annular calcification. No evidence of mitral valve regurgitation. No evidence of mitral valve stenosis. Tricuspid Valve: The tricuspid valve is normal in structure. Tricuspid valve regurgitation is trivial. No evidence of tricuspid stenosis. Aortic Valve: The aortic valve is  tricuspid. There is moderate calcification of the aortic valve. Aortic valve regurgitation is not visualized. Aortic valve sclerosis/calcification is present, without any evidence of aortic stenosis. Pulmonic Valve: The pulmonic valve was normal in structure. Pulmonic  valve regurgitation is not visualized. No evidence of pulmonic stenosis. Aorta: The aortic root is normal in size and structure. Venous: The inferior vena cava was not well visualized. IAS/Shunts: No atrial level shunt detected by color flow Doppler.  LEFT VENTRICLE PLAX 2D LVIDd:         3.90 cm   Diastology LVIDs:         2.70 cm   LV e' medial:    8.81 cm/s LV PW:         1.20 cm   LV E/e' medial:  9.9 LV IVS:        1.40 cm   LV e' lateral:   9.14 cm/s LVOT diam:     1.90 cm   LV E/e' lateral: 9.5 LV SV:         66 LV SV Index:   34 LVOT Area:     2.84 cm  RIGHT VENTRICLE RV Basal diam:  2.60 cm RV Mid diam:    2.20 cm RV S prime:     14.80 cm/s TAPSE (M-mode): 2.1 cm LEFT ATRIUM             Index        RIGHT ATRIUM           Index LA diam:        3.50 cm 1.82 cm/m   RA Area:     14.70 cm LA Vol (A2C):   59.0 ml 30.66 ml/m  RA Volume:   29.30 ml  15.22 ml/m LA Vol (A4C):   78.3 ml 40.68 ml/m LA Biplane Vol: 74.1 ml 38.50 ml/m  AORTIC VALVE LVOT Vmax:   112.00 cm/s LVOT Vmean:  75.600 cm/s LVOT VTI:    0.233 m  AORTA Ao Root diam: 3.00 cm MITRAL VALVE MV Area (PHT): 2.76 cm     SHUNTS MV Decel Time: 275 msec     Systemic VTI:  0.23 m MV E velocity: 86.90 cm/s   Systemic Diam: 1.90 cm MV A velocity: 111.00 cm/s MV E/A ratio:  0.78 Toribio Fuel MD Electronically signed by Toribio Fuel MD Signature Date/Time: 12/14/2024/3:27:47 PM    Final    CT Head Wo Contrast Result Date: 12/13/2024 CLINICAL DATA:  Headache history of bladder cancer EXAM: CT HEAD WITHOUT CONTRAST TECHNIQUE: Contiguous axial images were obtained from the base of the skull through the vertex without intravenous contrast. RADIATION DOSE REDUCTION: This exam was  performed according to the departmental dose-optimization program which includes automated exposure control, adjustment of the mA and/or kV according to patient size and/or use of iterative reconstruction technique. COMPARISON:  MRI 06/12/2024 FINDINGS: Brain: No acute territorial infarction, hemorrhage or intracranial mass is visualized. Mild atrophy. Moderate white matter hypodensity consistent with chronic small vessel disease. The ventricles are nonenlarged Vascular: No hyperdense vessels.  Carotid vascular calcification Skull: Normal. Negative for fracture or focal lesion. Sinuses/Orbits: No acute finding. Other: None IMPRESSION: 1. No CT evidence for acute intracranial abnormality. 2. Atrophy and chronic small vessel ischemic changes of the white matter. Electronically Signed   By: Luke Bun M.D.   On: 12/13/2024 22:49   DG Chest 2 View Result Date: 12/13/2024 EXAM: 2 VIEW(S) XRAY OF THE CHEST 12/13/2024 05:04:43 PM COMPARISON: 04/21/2021 CLINICAL HISTORY: Shortness of breath, weight gain. FINDINGS: LUNGS AND PLEURA: Vague right mid lung opacity. No pleural effusion. No pneumothorax. HEART AND MEDIASTINUM: Aortic atherosclerosis. No acute abnormality of the cardiac and mediastinal silhouettes. BONES AND SOFT TISSUES: Thoracic spondylosis. IMPRESSION:  1. Vague right mid lung opacity; recommend follow-up chest radiograph to document resolution and exclude an underlying lesion. Electronically signed by: Oneil Devonshire MD 12/13/2024 05:40 PM EST RP Workstation: [id number]    Scheduled Meds:  alfuzosin   10 mg Oral Q breakfast   aspirin  EC  81 mg Oral Daily   enoxaparin  (LOVENOX ) injection  40 mg Subcutaneous Q24H   furosemide   40 mg Intravenous BID   hydrALAZINE   25 mg Oral Q8H   irbesartan   300 mg Oral Daily   rosuvastatin   5 mg Oral Daily   Continuous Infusions:  nitroGLYCERIN  30 mcg/min (12/15/24 1324)     LOS: 2 days   Time spent: 30 minutes  Lonni KANDICE Moose, MD  [facility]  12/15/2024, 2:27 PM   "

## 2024-12-15 NOTE — Telephone Encounter (Signed)
 PAP: Patient assistance application for Farxiga through [employer] ([employer]) has been mailed to pt's home address on file. Provider portion of application will be faxed to provider's office.

## 2024-12-15 NOTE — Consult Note (Signed)
 "  Cardiology Consultation  Patient ID: Ricardo [name] MRN: [id number]; DOB: 08-31-1941  Admit date: 12/13/2024 Date of Consult: 12/15/2024  PCP:  Sebastian Beverley NOVAK, MD   [facility] Providers Cardiologist:  Darryle ONEIDA Decent, MD     Patient Profile: Ricardo [name] is a 84 y.o. male with a hx of CAD s/p stent to RCA 2001, cath 2021 showed mild to moderate multivessel disease that was managed medically, hypertension, hyperlipidemia, AAA, orthostatic hypotension, prediabetes, bilateral carotid artery disease, BPH, who is being seen 12/15/2024 for the evaluation of CHF at the request of Dr. Maranda.  History of Present Illness: Ricardo [name] has past medical history as stated above.  He presented to [facility] ED 12/13/2024 complaining of shortness of breath, lower extremity swelling.  Patient reported progressive shortness of breath with associated increasing lower extremity edema that has been occurring over the last 3 weeks.  He also reports an associated 15 pound unintentional weight gain during this time as well. He went to see his PCP on 12/13/2024 after starting minoxidil  for resistant HTN on 11/22/2024. At this visit his weight was up from 184 lb ? 193 lb, he was feeling short of breath, LE edema. They recommended he go to the ER.   Relevant workup this admission includes: Troponin mildly elevated and flat consistent with CHF, proBNP elevated at 1416, TSH normal, creatinine 1.16 ? 1.30, CBC showed chronic thrombocytopenia.  CXR showed vague right sided lung opacity.  Echo showed: LVEF 60 to 65%, no RWMA, mild LVH, G1 DD, no significant valvular abnormalities. EKG showed sinus rhythm.   He has been admitted to the medicine service since he has been here.  Cardiology was asked to consult in the setting of volume overload.  He has been started on IV Lasix , patient noted good urine output.Currently on hydralazine  25 mg every 8 hours, irbesartan  300 mg daily, along with IV nitroglycerin   drip for BP.  He is a patient of Dr. Decent, last seen by Reche Finder, NP in our advanced hypertension clinic.  He was last seen in the hypertension clinic in January 2026.  He was noted to have very labile blood pressure with ranges from 120-170s.  His current medication regimen at this time included: hydralazine  25 mg BID, with an additional 25 mg PRN for SBP > 170, Imdur  60 mg BID, telmisartan  80 mg daily, minoxidil  10 mg daily, with discussion of possible retrial of spironolactone  as he was previously on it in 2022 and did not recall why it was discontinued.  Past Medical History:  Diagnosis Date   AAA (abdominal aortic aneurysm)    Abdominal aneurysm 08/30/2018   Arthritis    SHOULDERS, NECK , RIGHT HIP   Bladder cancer (HCC) DX   OCT 2011--  FOLLOWED BY DR WOODRUFF   S/P TURBT,  HIGH GRADE BLADDER CANCER   CAD (coronary artery disease), native coronary artery 08/30/2018   normal Left main, 30% stenosis ostial LAD, 50% stenosis OM 3, 80% stenosis mid RCA;  2.5 x 8 mm Tetra Stent Dr. Claudene   Chronic diastolic heart failure (HCC)    Coronary artery disease CARDIOLOGIST- DR BLANCA-  LAST VISIT 2 WKS AGO-- WILL REQUEST NOTE AND EKG   STRESS TEST -- JULY 2009   Essential hypertension 08/30/2018   Hyperlipidemia    Hypertension    Nocturia    Personal history of malignant neoplasm of bladder 08/30/2018   Post PTCA 2001-   X1 STENT TO RCA  AND 1996   Past Surgical History:  Procedure Laterality Date   CORONARY ANGIOPLASTY  1996   CORONARY ANGIOPLASTY WITH STENT PLACEMENT  2001   STENT TO RCA   CYSTO/ BLADDER BX  10-22-10   CYSTOSCOPY W/ RETROGRADES  11/12/2011   Procedure: CYSTOSCOPY WITH RETROGRADE PYELOGRAM;  Surgeon: Toribio Neysa Repine, MD;  Location: [facility];  Service: Urology;  Laterality: Bilateral;   LEFT HEART CATH AND CORONARY ANGIOGRAPHY N/A 07/09/2020   Procedure: LEFT HEART CATH AND CORONARY ANGIOGRAPHY;  Surgeon: Claudene Pacific, MD;  Location: [facility]  INVASIVE CV LAB;  Service: Cardiovascular;  Laterality: N/A;   LUMBAR FUSION     TRANSURETHRAL RESECTION OF BLADDER TUMOR  08-27-10   RIGHT ANTERIOR DOME   TRANSURETHRAL RESECTION OF BLADDER TUMOR  11/12/2011   Procedure: TRANSURETHRAL RESECTION OF BLADDER TUMOR (TURBT);  Surgeon: Toribio Neysa Repine, MD;  Location: [facility];  Service: Urology;  Laterality: N/A;  with PK Gyrus c-arm gyrus  digital ureteroscope    Home Medications:  Prior to Admission medications  Medication Sig Start Date End Date Taking? Authorizing Provider  acetaminophen  (TYLENOL ) 500 MG tablet Take 1,000 mg by mouth daily as needed for headache (pain).   Yes [provider]  aspirin  81 MG tablet Take 1 tablet (81 mg total) by mouth daily. 11/16/11  Yes Repine Toribio, MD  hydrALAZINE  (APRESOLINE ) 25 MG tablet Take 1 tablet (25 mg total) by mouth 2 (two) times daily. Ok to take 1 tablet as needed for blood pressure greater than 170 systolic. 08/18/24  Yes Vannie Reche RAMAN, NP  isosorbide  mononitrate (IMDUR ) 60 MG 24 hr tablet Take 1 tablet (60 mg total) by mouth in the morning and at bedtime. 07/20/24  Yes Vannie Reche RAMAN, NP  nitroGLYCERIN  (NITROSTAT ) 0.4 MG SL tablet Place 1 tablet (0.4 mg total) under the tongue every 5 (five) minutes as needed. Patient taking differently: Place 0.4 mg under the tongue every 5 (five) minutes as needed for chest pain. 08/18/24 12/14/24 Yes Vannie Reche RAMAN, NP  REPATHA  SURECLICK 140 MG/ML SOAJ INJECT 140 MILLIGRAM INTO THE SKIN ON THE 1ST AND 15TH OF [facility] MONTH Patient taking differently: Inject 140 mg into the skin See admin instructions. INJECT 140 MILLIGRAM INTO THE SKIN ON THE 1ST AND 15TH OF EACH MONTH 12/09/24  Yes West, Katlyn D, NP  telmisartan  (MICARDIS ) 80 MG tablet Take 1 tablet (80 mg total) by mouth daily. 04/13/24  Yes O'Neal, Darryle Ned, MD  alfuzosin  (UROXATRAL ) 10 MG 24 hr tablet Take 1 tablet (10 mg total) by mouth daily with  breakfast. Patient not taking: Reported on 12/14/2024 08/23/24 08/18/25  Sebastian Beverley NOVAK, MD  alfuzosin  (UROXATRAL ) 10 MG 24 hr tablet Take 1 tablet (10 mg total) by mouth daily with breakfast. 08/23/24   Sebastian Beverley NOVAK, MD  chlorthalidone  (HYGROTON ) 25 MG tablet Take 1/2 tablet (12.5 mg total) by mouth daily. 07/27/24   Sebastian Beverley NOVAK, MD  Evolocumab  (REPATHA  SURECLICK) 140 MG/ML SOAJ Inject 140 mg into the skin once a week. 12/09/24   West, Katlyn D, NP  finasteride  (PROSCAR ) 5 MG tablet Take 1 tablet (5 mg total) by mouth daily. 10/10/24     hydrALAZINE  (APRESOLINE ) 25 MG tablet Take 1 tablet (25 mg total) by mouth 2 (two) times daily. May also take 1 tablet (25 mg total) as needed for Systolic BP greater then 170. 89/7/74   Vannie Reche RAMAN, NP  isosorbide  mononitrate (IMDUR ) 60 MG 24 hr tablet Take  1 tablet (60 mg total) by mouth 2 (two) times daily. 07/20/24   Walker, Caitlin S, NP  minoxidil  (LONITEN ) 10 MG tablet Take 1/2 tablet (5 mg total) by mouth daily for 3 days, THEN 1 tablet (10 mg total) daily. 11/22/24 02/21/25  Sebastian Beverley NOVAK, MD  MYRBETRIQ 25 MG TB24 tablet Take 25 mg by mouth daily. Patient not taking: Reported on 12/14/2024 06/21/24   [provider]  nitroGLYCERIN  (NITROSTAT ) 0.4 MG SL tablet Dissolve 1 tablet under the tongue for chest pain. May repeat in 5 minutes if needed call 911 max 3 tablets every 15 minutes. 08/18/24   Vannie Reche RAMAN, NP  rosuvastatin  (CRESTOR ) 5 MG tablet Take 1 tablet (5 mg total) by mouth daily. Patient not taking: Reported on 12/14/2024 09/30/21   Barbaraann Darryle Ned, MD  telmisartan  (MICARDIS ) 80 MG tablet Take 1 tablet (80 mg total) by mouth daily. 04/13/24   O'NealDarryle Ned, MD   Scheduled Meds:  alfuzosin   10 mg Oral Q breakfast   aspirin  EC  81 mg Oral Daily   enoxaparin  (LOVENOX ) injection  40 mg Subcutaneous Q24H   furosemide   40 mg Intravenous BID   hydrALAZINE   25 mg Oral Q8H   irbesartan   300 mg Oral Daily   isosorbide   mononitrate  60 mg Oral BID   rosuvastatin   5 mg Oral Daily   Continuous Infusions:   PRN Meds: acetaminophen  **OR** acetaminophen , melatonin, ondansetron  (ZOFRAN ) IV  Allergies:   Allergies[1]  Social History:   Social History   Socioeconomic History   Marital status: Married    Spouse name: Not on file   Number of children: 4   Years of education: Not on file   Highest education level: Not on file  Occupational History   Occupation: retired - advice worker  Tobacco Use   Smoking status: Former    Current packs/day: 0.00    Average packs/day: 1.5 packs/day for 5.0 years (7.5 ttl pk-yrs)    Types: Cigarettes    Start date: 11/05/1960    Quit date: 11/05/1965    Years since quitting: 59.1    Passive exposure: Past   Smokeless tobacco: Never  Vaping Use   Vaping status: Never Used  Substance and Sexual Activity   Alcohol use: No   Drug use: No   Sexual activity: Not Currently    Birth control/protection: None  Other Topics Concern   Not on file  Social History Narrative   Not on file   Social Drivers of Health   Tobacco Use: Medium Risk (12/14/2024)   Patient History    Smoking Tobacco Use: Former    Smokeless Tobacco Use: Never    Passive Exposure: Past  Physicist, Medical Strain: Not on file  Food Insecurity: No Food Insecurity (12/14/2024)   Epic    Worried About Programme Researcher, Broadcasting/film/video in the Last Year: Never true    Ran Out of Food in the Last Year: Never true  Transportation Needs: No Transportation Needs (12/14/2024)   Epic    Lack of Transportation (Medical): No    Lack of Transportation (Non-Medical): No  Physical Activity: Inactive (11/08/2024)   Exercise Vital Sign    Days of Exercise per Week: 0 days    Minutes of Exercise per Session: 0 min  Stress: Stress Concern Present (11/08/2024)   [employer] of Occupational Health - Occupational Stress Questionnaire    Feeling of Stress: Very much  Social Connections: Moderately Isolated (12/14/2024)    Social  Connection and Isolation Panel    Frequency of Communication with Friends and Family: More than three times a week    Frequency of Social Gatherings with Friends and Family: Three times a week    Attends Religious Services: Never    Active Member of Clubs or Organizations: No    Attends Banker Meetings: Never    Marital Status: Married  Catering Manager Violence: Not At Risk (12/14/2024)   Epic    Fear of Current or Ex-Partner: No    Emotionally Abused: No    Physically Abused: No    Sexually Abused: No  Depression (PHQ2-9): Low Risk (12/13/2024)   Depression (PHQ2-9)    PHQ-2 Score: 0  Recent Concern: Depression (PHQ2-9) - Medium Risk (11/08/2024)   Depression (PHQ2-9)    PHQ-2 Score: 6  Alcohol Screen: Not on file  Housing: Low Risk (12/14/2024)   Epic    Unable to Pay for Housing in the Last Year: No    Number of Times Moved in the Last Year: 0    Homeless in the Last Year: No  Utilities: Not At Risk (12/14/2024)   Epic    Threatened with loss of utilities: No  Health Literacy: Adequate Health Literacy (11/08/2024)   B1300 Health Literacy    Frequency of need for help with medical instructions: Never    Family History:   Family History  Problem Relation Age of Onset   Diabetes Mother    Hypertension Mother     ROS:  Please see the history of present illness.  All other ROS reviewed and negative.     Physical Exam/Data: Vitals:   12/15/24 0545 12/15/24 0742 12/15/24 1136 12/15/24 1529  BP: (!) 117/52 125/68 (!) 148/72 (!) 141/84  Pulse: (!) 55 78 88 73  Resp: 15 13 20 14   Temp:  98.3 F (36.8 C) 97.9 F (36.6 C) 97.9 F (36.6 C)  TempSrc:  Oral Oral Oral  SpO2: 94% 96% 93% 95%  Weight:      Height:        Intake/Output Summary (Last 24 hours) at 12/15/2024 1540 Last data filed at 12/15/2024 1531 Gross per 24 hour  Intake 240 ml  Output 2300 ml  Net -2060 ml      12/14/2024    3:15 AM 12/14/2024   12:00 AM 12/13/2024    4:40 PM   Last 3 Weights  Weight (lbs) 182 lb 12.2 oz 182 lb 12.2 oz 194 lb 3.2 oz  Weight (kg) 82.9 kg 82.9 kg 88.089 kg     Body mass index is 29.5 kg/m.   General:  in no acute distress, on room air HEENT: normal Neck: + JVD Vascular: No carotid bruits; Distal pulses 2+ bilaterally Cardiac:  normal S1, S2; RRR; no murmur  Lungs:  clear to auscultation bilaterally, no wheezing, rhonchi or rales  Abd: soft, nontender, no hepatomegaly  Ext: no edema Musculoskeletal:  No deformities, BUE and BLE strength normal and equal Skin: warm and dry  Neuro:  CNs 2-12 intact, no focal abnormalities noted Psych:  Normal affect   EKG:  The EKG was personally reviewed and demonstrates:  sinus rhythm, HR 84, no acute changes noted   Telemetry:  Telemetry was personally reviewed and demonstrates:  sinus  Relevant CV Studies:  Echocardiogram, 12/14/2024 Left ventricular ejection fraction, by estimation, is 60 to 65% . The left ventricle has normal function. The left ventricle has no regional wall motion abnormalities. There is mild concentric left ventricular  hypertrophy. Left ventricular diastolic parameters are consistent with Grade I diastolic dysfunction ( impaired relaxation) .  Right ventricular systolic function is normal. The right ventricular size is normal. Tricuspid regurgitation signal is inadequate for assessing PA pressure.  The mitral valve is normal in structure. No evidence of mitral valve regurgitation. No evidence of mitral stenosis.  The aortic valve is tricuspid. There is moderate calcification of the aortic valve. Aortic valve regurgitation is not visualized. Aortic valve sclerosis/ calcification is present, without any evidence of aortic stenosis.  Lexiscan , 08/16/2021   Findings are consistent with no prior ischemia and no prior myocardial infarction. The study is intermediate risk due to reduced systolic function.   No ST deviation was noted.   LV perfusion is normal. There is no  evidence of ischemia. There is no evidence of infarction.   Left ventricular function is abnormal. Global function is mildly reduced. There were no regional wall motion abnormalities. Nuclear stress EF: 49 %. The left ventricular ejection fraction is mildly decreased (45-54%). End diastolic cavity size is normal. End systolic cavity size is normal.  Left heart cath, 07/09/2020 Ost LAD to Prox LAD lesion is 50% stenosed. Mid LAD to Dist LAD lesion is 50% stenosed. Ost RCA lesion is 40% stenosed. Prox RCA lesion is 40% stenosed. Mid RCA lesion is 40% stenosed. 2nd Diag lesion is 50% stenosed. 2nd Mrg lesion is 60% stenosed. Mid Cx lesion is 30% stenosed.   Mild to moderate multivessel disease. Add isosorbide  and ASA and Plavix  for 6 months and life style modification.  Long-term monitor, 08/05/2021 Patient had a min HR of 38 bpm (sinus bradycardia), max HR of 171 bpm (supraventricular tachycardia, 2.6 second duration), and avg HR of 63 bpm (normal sinus rhythm).  Predominant underlying rhythm was Sinus Rhythm. 12 Supraventricular Tachycardia runs occurred, the run with the fastest interval lasting 5 beats (2.6 second duration) with a max rate of 171 bpm, the longest lasting 13.0 secs (25 beats) with an avg rate of 119 bpm.  Isolated SVEs were rare (<1.0%), SVE Couplets were rare (<1.0%), and SVE Triplets were rare (<1.0%). Isolated VEs were rare (<1.0%), VE Couplets were rare (<1.0%), and no VE Triplets were present. Ventricular Bigeminy was present.   Laboratory Data: High Sensitivity Troponin:  No results for input(s): TROPONINIHS in the last 720 hours.  Recent Labs  Lab 12/13/24 1707 12/13/24 2009 12/14/24 1015  TRNPT 27* 28* 25*      Chemistry Recent Labs  Lab 12/13/24 1650 12/14/24 0219 12/15/24 0905  NA 140 140 138  K 4.0 4.0 3.8  CL 105 102 103  CO2 25 28 25   GLUCOSE 124* 180* 128*  BUN 16 21 26*  CREATININE 1.16 1.30* 1.22  CALCIUM  9.6 9.1 8.9  MG  --  2.4 2.3   GFRNONAA >60 55* 59*  ANIONGAP 10 10 10     Recent Labs  Lab 12/14/24 0219  PROT 6.6  ALBUMIN 4.1  AST 22  ALT 17  ALKPHOS 70  BILITOT 0.5   Lipids No results for input(s): CHOL, TRIG, HDL, LABVLDL, LDLCALC, CHOLHDL in the last 168 hours.  Hematology Recent Labs  Lab 12/13/24 1650 12/14/24 0219  WBC 6.5 6.3  RBC 4.76 4.54  HGB 13.5 12.8*  HCT 40.5 39.0  MCV 85.1 85.9  MCH 28.4 28.2  MCHC 33.3 32.8  RDW 12.4 12.5  PLT 131* 124*   Thyroid   Recent Labs  Lab 12/14/24 0219  TSH 2.610  FREET4 1.56    BNP  Recent Labs  Lab 12/13/24 1650 12/14/24 0219  PROBNP 1,260.0* 1,416.0*    DDimer No results for input(s): DDIMER in the last 168 hours.  Radiology/Studies:  ECHOCARDIOGRAM COMPLETE Result Date: 12/14/2024    ECHOCARDIOGRAM REPORT   Patient Name:   Dagoberto A [facility] Date of Exam: 12/14/2024 Medical Rec #:  [id number]      Height:       66.0 in Accession #:    [id number]     Weight:       182.8 lb Date of Birth:  1941-06-12      BSA:          1.925 m Patient Age:    83 years       BP:           150/65 mmHg Patient Gender: M              HR:           77 bpm. Exam Location:  Inpatient Procedure: 2D Echo, Cardiac Doppler and Color Doppler (Both Spectral and Color            Flow Doppler were utilized during procedure). Indications:    CHF - Acute Diastolic  History:        Patient has prior history of Echocardiogram examinations, most                 recent 05/12/2021. CHF, CAD and Angina, Arrythmias:Bradycardia,                 Signs/Symptoms:Shortness of Breath and Chest Pain; Risk                 Factors:Dyslipidemia, Hypertension and Former Smoker.  Sonographer:    Juliene Rucks Referring Phys: [id number] JUSTIN B HOWERTER IMPRESSIONS  1. Left ventricular ejection fraction, by estimation, is 60 to 65%. The left ventricle has normal function. The left ventricle has no regional wall motion abnormalities. There is mild concentric left ventricular hypertrophy. Left  ventricular diastolic parameters are consistent with Grade I diastolic dysfunction (impaired relaxation).  2. Right ventricular systolic function is normal. The right ventricular size is normal. Tricuspid regurgitation signal is inadequate for assessing PA pressure.  3. The mitral valve is normal in structure. No evidence of mitral valve regurgitation. No evidence of mitral stenosis.  4. The aortic valve is tricuspid. There is moderate calcification of the aortic valve. Aortic valve regurgitation is not visualized. Aortic valve sclerosis/calcification is present, without any evidence of aortic stenosis. FINDINGS  Left Ventricle: Left ventricular ejection fraction, by estimation, is 60 to 65%. The left ventricle has normal function. The left ventricle has no regional wall motion abnormalities. The left ventricular internal cavity size was normal in size. There is  mild concentric left ventricular hypertrophy. Left ventricular diastolic parameters are consistent with Grade I diastolic dysfunction (impaired relaxation). Right Ventricle: The right ventricular size is normal. No increase in right ventricular wall thickness. Right ventricular systolic function is normal. Tricuspid regurgitation signal is inadequate for assessing PA pressure. Left Atrium: Left atrial size was normal in size. Right Atrium: Right atrial size was normal in size. Pericardium: There is no evidence of pericardial effusion. Mitral Valve: The mitral valve is normal in structure. Mild mitral annular calcification. No evidence of mitral valve regurgitation. No evidence of mitral valve stenosis. Tricuspid Valve: The tricuspid valve is normal in structure. Tricuspid valve regurgitation is trivial. No evidence of tricuspid stenosis. Aortic Valve: The aortic valve is tricuspid. There is moderate calcification  of the aortic valve. Aortic valve regurgitation is not visualized. Aortic valve sclerosis/calcification is present, without any evidence of aortic  stenosis. Pulmonic Valve: The pulmonic valve was normal in structure. Pulmonic valve regurgitation is not visualized. No evidence of pulmonic stenosis. Aorta: The aortic root is normal in size and structure. Venous: The inferior vena cava was not well visualized. IAS/Shunts: No atrial level shunt detected by color flow Doppler.  LEFT VENTRICLE PLAX 2D LVIDd:         3.90 cm   Diastology LVIDs:         2.70 cm   LV e' medial:    8.81 cm/s LV PW:         1.20 cm   LV E/e' medial:  9.9 LV IVS:        1.40 cm   LV e' lateral:   9.14 cm/s LVOT diam:     1.90 cm   LV E/e' lateral: 9.5 LV SV:         66 LV SV Index:   34 LVOT Area:     2.84 cm  RIGHT VENTRICLE RV Basal diam:  2.60 cm RV Mid diam:    2.20 cm RV S prime:     14.80 cm/s TAPSE (M-mode): 2.1 cm LEFT ATRIUM             Index        RIGHT ATRIUM           Index LA diam:        3.50 cm 1.82 cm/m   RA Area:     14.70 cm LA Vol (A2C):   59.0 ml 30.66 ml/m  RA Volume:   29.30 ml  15.22 ml/m LA Vol (A4C):   78.3 ml 40.68 ml/m LA Biplane Vol: 74.1 ml 38.50 ml/m  AORTIC VALVE LVOT Vmax:   112.00 cm/s LVOT Vmean:  75.600 cm/s LVOT VTI:    0.233 m  AORTA Ao Root diam: 3.00 cm MITRAL VALVE MV Area (PHT): 2.76 cm     SHUNTS MV Decel Time: 275 msec     Systemic VTI:  0.23 m MV E velocity: 86.90 cm/s   Systemic Diam: 1.90 cm MV A velocity: 111.00 cm/s MV E/A ratio:  0.78 Toribio Fuel MD Electronically signed by Toribio Fuel MD Signature Date/Time: 12/14/2024/3:27:47 PM    Final    CT Head Wo Contrast Result Date: 12/13/2024 CLINICAL DATA:  Headache history of bladder cancer EXAM: CT HEAD WITHOUT CONTRAST TECHNIQUE: Contiguous axial images were obtained from the base of the skull through the vertex without intravenous contrast. RADIATION DOSE REDUCTION: This exam was performed according to the departmental dose-optimization program which includes automated exposure control, adjustment of the mA and/or kV according to patient size and/or use of iterative  reconstruction technique. COMPARISON:  MRI 06/12/2024 FINDINGS: Brain: No acute territorial infarction, hemorrhage or intracranial mass is visualized. Mild atrophy. Moderate white matter hypodensity consistent with chronic small vessel disease. The ventricles are nonenlarged Vascular: No hyperdense vessels.  Carotid vascular calcification Skull: Normal. Negative for fracture or focal lesion. Sinuses/Orbits: No acute finding. Other: None IMPRESSION: 1. No CT evidence for acute intracranial abnormality. 2. Atrophy and chronic small vessel ischemic changes of the white matter. Electronically Signed   By: Luke Bun M.D.   On: 12/13/2024 22:49   DG Chest 2 View Result Date: 12/13/2024 EXAM: 2 VIEW(S) XRAY OF THE CHEST 12/13/2024 05:04:43 PM COMPARISON: 04/21/2021 CLINICAL HISTORY: Shortness of breath, weight gain. FINDINGS: LUNGS AND  PLEURA: Vague right mid lung opacity. No pleural effusion. No pneumothorax. HEART AND MEDIASTINUM: Aortic atherosclerosis. No acute abnormality of the cardiac and mediastinal silhouettes. BONES AND SOFT TISSUES: Thoracic spondylosis. IMPRESSION: 1. Vague right mid lung opacity; recommend follow-up chest radiograph to document resolution and exclude an underlying lesion. Electronically signed by: Oneil Devonshire MD 12/13/2024 05:40 PM EST RP Workstation: [id number]   Assessment and Plan:  Acute HFpEF vs drug induced fluid retention with recent minoxidil  use Presented with gradually worsening SOB, LE edema, 15 lb weight gain No prior diagnosis of CHF in past  Started on minoxidil  1/6 and presented with symptoms of volume overload 1/27 proBNP elevated at 1,416 Echo: LVEF 60 to 65%, mild LVH, G1 DD, normal RV systolic function, no significant valvular abnormalities Started on IV Lasix , reports good urine output Creatinine 1.16 ? 1.30 ? 1.22, BUN trending up Mild JVD, LE improved, lungs clear on exam Note that patient was started on minoxidil  by PCP on 11/22/2024  Dry weight ~  184lb -- noted to be 193 lb at PCP 12/13/24 Currently on IV Lasix , continue with 40 mg dose tonight and reassess in AM Continue irbesartan  300 mg daily  Previously discussed restarted MRA as an outpatient, was on in 2022 and unsure why it was discontinued, would hold off while actively diuresing as patient is very concerned about his renal function, but consider adding prior to discharge   Resistant hypertension Home meds: hydralazine  25 mg BID, with an additional 25 mg PRN for SBP > 170, Imdur  60 mg BID, telmisartan  80 mg daily, minoxidil  10 mg daily Followed in our advanced hypertension clinic Workup negative for RAS, hyperaldosteronism, pheochromocytoma  Has been struggling with BP control for some time Currently on IV Lasix  40 mg BID, continue for today  Currently on hydralazine  25 mg every 8 hours Currently on irbesartan  300 mg daily Previously on IV NTG, I have discontinued and started back on PTA Imdur  60 mg BID  CAD s/p stent to RCA 2001 Hyperlipidemia, goal LDL < 70 LHC 06/2020: Mild to moderate multivessel disease, treated medically Lexiscan  07/2021: No prior ischemia, myocardial infarction, normal LV perfusion, mildly reduced EF 49% 08/09/2024: HDL 45.70; LDL Cholesterol 55 12/14/2024: ALT 17  Home meds: Aspirin  81 mg daily, Repatha , Imdur  60 mg BID, Crestor  5 mg daily No prior active chest pain Troponin mildly elevated and flat 27 ? 28 ? 25, not consistent with ACS Continue aspirin  Continue statin   Per primary AAA Carotid artery disease BPH Prediabetes   Risk Assessment/Risk Scores:      [place]  Heart Association (NYHA) Functional Class NYHA Class II   For questions or updates, please contact  [facility] Please consult [email or web address] for contact info under   Signed, Waddell DELENA Donath, PA-C  12/15/2024 3:40 PM     [1]  Allergies Allergen Reactions   Clonidine  Hcl Other (See Comments)    Reaction to 0.2 mg tabs - caused dry mouth, drowsiness,  lightheadedness - reported 07/08/2020 (no reaction to patch)   Cyclobenzaprine Other (See Comments)    Numbness in lips   Norvasc [Amlodipine Besylate] Other (See Comments)    CAUSES TEETH TO FALL OUT   "

## 2024-12-15 NOTE — Telephone Encounter (Signed)
 Ran test claim for farxiga brand and generic. For a 30 day supply and the co-pay is 182.34 . PA is not needed at this time. This test claim was processed through [facility]- copay amounts may vary at other pharmacies due to pharmacy/plan contracts, or as the patient moves through the different stages of their insurance plan.     Ran test claim for jardiance. For a 30 day supply and the co-pay is 204.57 . PA is not needed at this time. This test claim was processed through [facility]- copay amounts may vary at other pharmacies due to pharmacy/plan contracts, or as the patient moves through the different stages of their insurance plan.

## 2024-12-15 NOTE — Progress Notes (Signed)
" ° °  Heart Failure Stewardship Pharmacist Progress Note   PCP: Sebastian Beverley NOVAK, MD PCP-Cardiologist: Darryle ONEIDA Decent, MD    HPI:  84 year old male with a PMH significant for HFpEF (last EF 60-65% in 2022), HTN, HLD who was admitted on 12/13/24 from [facility] for progressive SOB and increased bilateral lower extremity edema over the past 3 weeks. He also reported an unintentional 15-lb weight gain over the past few weeks. CXR showed vague right mid-lung opacity. CT head was negative for acute intracranial abnormality.   Patient is doing well today. He denies SOB and chest pain. His lower extremity edema is improved on physical exam. ECHO yesterday was unchanged. Cardiology is consulted but has not seen him yet. Discussed copay for Jardiance and Farxiga, since he has a deductible. Will try to start process for patient assistance. Patient was re-enrolled in [facility] for Repatha  thru 11/16/2025.  Current HF Medications: Diuretic: IV furosemide  40 mg BID ACE/ARB/ARNI: irbesartan  300 mg daily Other: hydralazine  25 mg Q8hr  Prior to admission HF Medications: ACE/ARB/ARNI: telmisartan  80 mg daily Other: hydralazine  25 mg BID + 1 additional tablet PRN if BP elevated  Pertinent Lab Values: Serum creatinine 1.22 (trending down), BUN 26, Potassium 3.8, Sodium 138, proBNP 1,416, Magnesium 2.3, A1c 6.2%  Vital Signs: Weight: 182 lbs (admission weight: 194 lbs) Blood pressure: 120s-160s/60s  Heart rate: 50s-80s  I/O: net -2 L yesterday; net -4.1 L since admission  Medication Assistance / Insurance Benefits Check: Does the patient have prescription insurance?  Yes Type of insurance plan: [employer] Medicare Part D  Does the patient qualify for medication assistance through manufacturers or grants?   Yes Eligible grants and/or patient assistance programs: [facility] Medication assistance applications in progress: none   Medication assistance applications approved:  none Approved medication assistance renewals will be completed by: [facility]  Outpatient Pharmacy:  Prior to admission outpatient pharmacy: [facility] Is the patient willing to use [facility] TOC pharmacy at discharge? Yes Is the patient willing to transition their outpatient pharmacy to utilize a [facility] outpatient pharmacy?   Yes    Assessment: 1. 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 NYHA class II symptoms. - Improving lower extremity edema on physical exam > continue IV Lasix  40 mg BID - BP elevated and currently on a nitroglycerin  drip, irbesartan  300 mg daily and hydralazine  25 mg Q8hr > recommend transitioning back to PTA Imdur , adding spironolactone  since K 3.8 and Scr improving and adding Farixga - HR has been soft > hold on beta blocker for now  Plan: 1) Medication changes recommended at this time: - Transition nitroglycerin  infusion to Imdur  60 mg BID - Consider initiating Farxiga 10 mg daily - Consider initiating spironolactone  12.5 mg daily  2) Patient assistance: - Jardiance copay is $204.57 - Doreen copay is 182.34 for 30 days due to deductible  - [facility] renewed for Repatha  through 11/13/2025  3)  Education  - To be completed prior to discharge  B. Maegan Lougenia Morrissey, PharmD PGY-1 Pharmacy Resident [facility] 12/15/2024 8:29 AM    "

## 2024-12-16 ENCOUNTER — Other Ambulatory Visit (HOSPITAL_COMMUNITY): Payer: Self-pay

## 2024-12-16 LAB — BASIC METABOLIC PANEL WITH GFR
Anion gap: 12 (ref 5–15)
BUN: 32 mg/dL — ABNORMAL HIGH (ref 8–23)
CO2: 27 mmol/L (ref 22–32)
Calcium: 8.9 mg/dL (ref 8.9–10.3)
Chloride: 103 mmol/L (ref 98–111)
Creatinine, Ser: 1.28 mg/dL — ABNORMAL HIGH (ref 0.61–1.24)
GFR, Estimated: 56 mL/min — ABNORMAL LOW
Glucose, Bld: 99 mg/dL (ref 70–99)
Potassium: 4.1 mmol/L (ref 3.5–5.1)
Sodium: 142 mmol/L (ref 135–145)

## 2024-12-16 LAB — MAGNESIUM: Magnesium: 2.2 mg/dL (ref 1.7–2.4)

## 2024-12-16 MED ORDER — FUROSEMIDE 40 MG PO TABS
40.0000 mg | ORAL_TABLET | Freq: Every day | ORAL | 1 refills | Status: AC | PRN
  Filled 2024-12-16: qty 30, 30d supply, fill #0

## 2024-12-16 MED ORDER — HYDRALAZINE HCL 25 MG PO TABS
25.0000 mg | ORAL_TABLET | Freq: Three times a day (TID) | ORAL | 2 refills | Status: DC
  Filled 2024-12-16: qty 120, 40d supply, fill #0

## 2024-12-16 MED ORDER — SPIRONOLACTONE 25 MG PO TABS
25.0000 mg | ORAL_TABLET | Freq: Every day | ORAL | 0 refills | Status: DC
  Filled 2024-12-16: qty 30, 30d supply, fill #0

## 2024-12-16 MED ORDER — HYDRALAZINE HCL 25 MG PO TABS
25.0000 mg | ORAL_TABLET | Freq: Three times a day (TID) | ORAL | 2 refills | Status: AC
  Filled 2024-12-16: qty 120, 40d supply, fill #0

## 2024-12-16 MED ORDER — SPIRONOLACTONE 25 MG PO TABS
25.0000 mg | ORAL_TABLET | Freq: Every day | ORAL | 0 refills | Status: AC
  Filled 2024-12-16: qty 30, 30d supply, fill #0

## 2024-12-16 NOTE — Care Management Important Message (Signed)
 Important Message  Patient Details  Name: Ricardo [name] MRN: [id number] Date of Birth: [birth year]   Important Message Given:  Yes - Medicare IM     Claretta Deed 12/16/2024, 1:37 PM

## 2024-12-16 NOTE — Care Management Important Message (Signed)
 Important Message  Patient Details  Name: Ricardo [name] MRN: [id number] Date of Birth: Nov 19, 1940   Important Message Given:  Yes - Medicare IM  Patient left prior to IM delivery will mail a copy to the patient home address.    Karne Ozga 12/16/2024, 11:21 AM

## 2024-12-16 NOTE — Progress Notes (Signed)
" ° °  Heart Failure Stewardship Pharmacist Progress Note   PCP: Sebastian Beverley NOVAK, MD PCP-Cardiologist: Darryle ONEIDA Decent, MD    HPI:  84 year old male with a PMH significant for HFpEF (last EF 60-65% in 2022), HTN, HLD who was admitted on 12/13/24 from [facility] for progressive SOB and increased bilateral lower extremity edema over the past 3 weeks. He also reported an unintentional 15-lb weight gain over the past few weeks. CXR showed vague right mid-lung opacity. CT head was negative for acute intracranial abnormality. ECHO 1/28 with LVEF 60-65%.  Met with patient and his spouse at bedside. Reports he is feeling a little tired but is improved from a fluid standpoint. No LE edema. Denies shortness of breath or chest pain. Reviewed changes to GDMT and goals of therapy.  Discussed copay for Jardiance and Farxiga , since he has a deductible and he will need a grant for this cost if either of these are added. Patient was re-enrolled in [facility] for Repatha  thru 11/16/2025.  Current HF Medications: ACE/ARB/ARNI: irbesartan  300 mg daily MRA: spironolactone  25 mg daily Other: hydralazine  25 mg Q8hr + Imdur  60 mg BID  Prior to admission HF Medications: ACE/ARB/ARNI: telmisartan  80 mg daily Other: hydralazine  25 mg BID + 1 additional tablet PRN if BP elevated  Pertinent Lab Values: Serum creatinine 1.28, BUN 32, Potassium 4.1, Sodium 142, proBNP 1,416, Magnesium 2.2, A1c 6.2%  Vital Signs: Weight: 175 lbs (admission weight: 194 lbs) Blood pressure: 120-160/60s  Heart rate: 50-60s  I/O: net -1.9 L yesterday; net -6.2 L since admission  Medication Assistance / Insurance Benefits Check: Does the patient have prescription insurance?  Yes Type of insurance plan: [employer] Medicare Part D  Does the patient qualify for medication assistance through manufacturers or grants?   Yes Eligible grants and/or patient assistance programs: Orth[facility]ist Medication assistance applications  in progress: none   Medication assistance applications approved: none Approved medication assistance renewals will be completed by: [facility]  Outpatient Pharmacy:  Prior to admission outpatient pharmacy: [facility] Is the patient willing to use [facility] TOC pharmacy at discharge? Yes Is the patient willing to transition their outpatient pharmacy to utilize a [facility] outpatient pharmacy?   Yes    Assessment: 1. 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 NYHA class II symptoms. - Now off IV lasix . Volume status ok. - Continue irbesartan  300 mg daily - on telmisartan  PTA. - Continue spironolactone  25 mg daily - Consider increasing hydralazine  to 50 mg TID - Continue Imdur  60 mg BID. Off nitroglycerin  gtt. - Consider adding SGLT2i prior to discharge  Plan: 1) Medication changes recommended at this time: - Increase hydralazine  to 50 mg TID - Consider initiating Farxiga  10 mg daily  2) Patient assistance: - Jardiance copay is $204.57 Farxiga  copay is 182.34 for 30 days due to deductible  - [facility] renewed for Repatha  through 11/13/2025  3)  Education  - Patient has been educated on current HF medications and potential additions to HF medication regimen - Patient verbalizes understanding that over the next few months, these medication doses may change and more medications may be added to optimize HF regimen - Patient has been educated on basic disease state pathophysiology and goals of therapy  Duwaine Plant, PharmD, BCPS Heart Failure Stewardship Pharmacist Phone [phone]    "

## 2024-12-18 NOTE — Progress Notes (Unsigned)
 " Cardiology Office Note:  .   Date:  12/21/2024  ID:  Ricardo [name], DOB 1941/09/12, MRN [id number] PCP: Ricardo Beverley NOVAK, MD  [facility] Providers Cardiologist:  Ricardo ONEIDA Decent, MD   History of Present Illness: .    Chief Complaint  Patient presents with   Follow-up    Ricardo [name] is a 84 y.o. male with below history who presents for follow-up.   History of Present Illness   Ricardo [name] is an 84 year old male with coronary artery disease, abdominal aortic aneurysm, hypertension, and hyperlipidemia who presents for follow-up after hospitalization for diastolic heart failure.  He was admitted to the hospital in late January for diastolic heart failure, attributed to minoxidil  use. Since discharge, he feels 'all right' and notes that his blood pressure has been running between 140 and 156 mmHg, with a reading of 130/50 mmHg today. He has been engaging in some walking exercises, which he believes is helping his blood pressure. No chest pain or trouble breathing.  He is currently on a regimen of hydralazine  25 mg three times a day, Imdur  60 mg twice a day, spironolactone  25 mg daily, and telmisartan  80 mg daily. He takes telmisartan  at night and spironolactone  in the morning. He also uses Lasix  as needed but has not taken it recently. His weight has decreased from 175 lbs at hospital discharge to 172 lbs currently.  He had a heart ultrasound, recent labs, and EKGs performed. He is also being followed for an abdominal aortic aneurysm, which was stable at 38 mm in July. He is due to see the vascular surgeon in the summer.  No recent angina. He reports taking Repatha  for lipid control.           Problem List 1. Moderate CAD -Prior RCA stent 2001 -LHC 07/10/2020: 50% LAD; 40% RCA; 60% OM2 -NM 08/16/2021: normal  2. AAA -3.6 cm 2020 -3.8 cm 2022 -3.8 cm 2023 -4.2 cm  2024 -3.8 cm 2025 3. Carotid artery disease  -R ICA 1-39% -L ICA 1-39% 4. HLD -T chol 125, HDL 46,  LDL 55, TG 123 5. HTN 6. HFpEF    ROS: All other ROS reviewed and negative. Pertinent positives noted in the HPI.     Studies Reviewed: SABRA       TTE 12/14/2024  1. Left ventricular ejection fraction, by estimation, is 60 to 65%. The  left ventricle has normal function. The left ventricle has no regional  wall motion abnormalities. There is mild concentric left ventricular  hypertrophy. Left ventricular diastolic  parameters are consistent with Grade I diastolic dysfunction (impaired  relaxation).   2. Right ventricular systolic function is normal. The right ventricular  size is normal. Tricuspid regurgitation signal is inadequate for assessing  PA pressure.   3. The mitral valve is normal in structure. No evidence of mitral valve  regurgitation. No evidence of mitral stenosis.   4. The aortic valve is tricuspid. There is moderate calcification of the  aortic valve. Aortic valve regurgitation is not visualized. Aortic valve  sclerosis/calcification is present, without any evidence of aortic  stenosis.   Physical Exam:   VS:  BP (!) 130/50 (BP Location: Left Arm, Patient Position: Sitting, Cuff Size: Normal)   Pulse 76   Ht 5' 6 (1.676 m)   Wt 181 lb (82.1 kg)   BMI 29.21 kg/m    Wt Readings from Last 3 Encounters:  12/21/24 181 lb (82.1 kg)  12/21/24 176  lb 9.6 oz (80.1 kg)  12/19/24 172 lb 4 oz (78.1 kg)    GEN: Well nourished, well developed in no acute distress NECK: No JVD; No carotid bruits CARDIAC: RRR, no murmurs, rubs, gallops RESPIRATORY:  Clear to auscultation without rales, wheezing or rhonchi  ABDOMEN: Soft, non-tender, non-distended EXTREMITIES:  No edema; No deformity  ASSESSMENT AND PLAN: .   Assessment and Plan    Chronic diastolic heart failure Recent hospitalization for fluid overload, likely due to minoxidil . Currently stable with no volume overload. - Continue furosemide  as needed for fluid management.  Essential hypertension Blood pressure  improved post-hospitalization, currently 130/50 mmHg. Monitoring ongoing for medication adjustment. - Continue hydralazine  25 mg TID. - Continue isosorbide  60 mg BID. - Continue spironolactone  25 mg daily in the morning. - Continue telmisartan  80 mg daily at night. - Monitor blood pressure twice daily for two weeks and report via MyChart. - Adjust medications if necessary based on blood pressure readings.  Coronary artery disease without angina No angina symptoms. Recent EKG and heart ultrasound normal. - Continue current management and monitoring.  Hyperlipidemia Lipid levels controlled on Repatha . LDL at 55 mg/dL. - Continue Repatha  as prescribed.  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 - Continue follow-up with vascular surgery in summer.  Bilateral carotid artery stenosis Minimal carotid artery disease noted. - Continue monitoring carotid artery disease.                Follow-up: Return in about 6 months (around 06/20/2025).  Signed, Ricardo DASEN. Barbaraann, MD, [facility]  [facility]  [address] [place], [place] [address] [phone]  2:01 PM   "

## 2024-12-19 ENCOUNTER — Telehealth: Payer: Self-pay

## 2024-12-19 NOTE — Transitions of Care (Post Inpatient/ED Visit) (Addendum)
 "  12/19/2024  Name: Ricardo [name] MRN: [id number] DOB: 12/10/1940  Today's TOC FU Call Status: Today's TOC FU Call Status:: Successful TOC FU Call Completed TOC FU Call Complete Date: 12/19/24  Patient's Name and Date of Birth confirmed. Name, DOB  Transition Care Management Follow-up Telephone Call Date of Discharge: 12/16/24 [facility]charge Facility: [facility]) Type of Discharge: Inpatient Admission Primary Inpatient Discharge Diagnosis:: Congestive Heart Failure How have you been since you were released from the hospital?: Better Any questions or concerns?: No  Items Reviewed: Did you receive and understand the discharge instructions provided?: Yes Medications obtained,verified, and reconciled?: Yes (Medications Reviewed) Any new allergies since your discharge?: No Dietary orders reviewed?: Yes Type of Diet Ordered:: Low sodium Heart Healthy Do you have support at home?: Yes People in Home [RPT]: spouse Name of Support/Comfort Primary Source: Rose  Medications Reviewed Today: Medications Reviewed Today     Reviewed by Eilleen Richerd GRADE, RN (Registered Nurse) on 12/19/24 at 1400  Med List Status: <None>   Medication Order Taking? Sig Documenting Provider Last Dose Status Informant  acetaminophen  (TYLENOL ) 500 MG tablet [id number] Yes Take 1,000 mg by mouth daily as needed for headache (pain). [provider]  Active Self, Pharmacy Records  alfuzosin  (UROXATRAL ) 10 MG 24 hr tablet [id number] Yes Take 1 tablet (10 mg total) by mouth daily with breakfast. Sebastian Beverley NOVAK, MD  Active   aspirin  81 MG tablet [id number] Yes Take 1 tablet (81 mg total) by mouth daily. Eliza Sieving, MD  Active Self, Pharmacy Records  Evolocumab  (REPATHA  SURECLICK) 140 MG/ML EMMANUEL [id number] Yes Inject 140 mg into the skin once a week. West, Katlyn D, NP  Active            Med Note LESLY, RICHERD GRADE Kitchens Dec 19, 2024  1:59 PM) Need to pick up from pharmacy when weather gets better   finasteride  (PROSCAR ) 5 MG tablet [id number] Yes Take 1 tablet (5 mg total) by mouth daily.   Active   furosemide  (LASIX ) 40 MG tablet [id number] Yes Take 1 tablet (40 mg total) by mouth daily as needed for fluid or edema (weight gain). Maranda Lonni MATSU, MD  Active   hydrALAZINE  (APRESOLINE ) 25 MG tablet [id number] Yes Take 1 tablet (25 mg total) by mouth 3 (three) times daily. Maranda Lonni MATSU, MD  Active   isosorbide  mononitrate (IMDUR ) 60 MG 24 hr tablet [id number] Yes Take 1 tablet (60 mg total) by mouth 2 (two) times daily. Vannie Reche RAMAN, NP  Active   nitroGLYCERIN  (NITROSTAT ) 0.4 MG SL tablet [id number]  Dissolve 1 tablet under the tongue for chest pain. May repeat in 5 minutes if needed call 911 max 3 tablets every 15 minutes. Walker, Caitlin S, NP  Active            Med Note LESLY, RICHERD GRADE Kitchens Dec 19, 2024  2:00 PM) Not needed  spironolactone  (ALDACTONE ) 25 MG tablet [id number] Yes Take 1 tablet (25 mg total) by mouth daily. Maranda Lonni MATSU, MD  Active   telmisartan  (MICARDIS ) 80 MG tablet [id number] Yes Take 1 tablet (80 mg total) by mouth daily. Barbaraann Darryle Ned, MD  Active             Home Care and Equipment/Supplies: Were Home Health Services Ordered?: No Any new equipment or medical supplies ordered?: No  Functional Questionnaire: Do you need assistance with bathing/showering or dressing?: No Do you need assistance with meal preparation?: No Do you  need assistance with eating?: No Do you have difficulty maintaining continence: No Do you need assistance with getting out of bed/getting out of a chair/moving?: No Do you have difficulty managing or taking your medications?: No  Follow up appointments reviewed: PCP Follow-up appointment confirmed?: Yes Date of PCP follow-up appointment?: 12/21/24 Follow-up Provider: Beverley Hummer, MD Specialist Hospital Follow-up appointment confirmed?: Yes Date of Specialist follow-up appointment?:  12/23/24 Follow-Up Specialty Provider:: Cardiology Do you need transportation to your follow-up appointment?: No Do you understand care options if your condition(s) worsen?: Yes-patient verbalized understanding SDOH Interventions Today    Flowsheet Row Most Recent Value  SDOH Interventions   Food Insecurity Interventions Intervention Not Indicated  Housing Interventions Intervention Not Indicated  Transportation Interventions Intervention Not Indicated, Patient Resources (Friends/Family)  Utilities Interventions Intervention Not Indicated    Goals Addressed             This Visit's Progress    VBCI Transitions of Care (TOC) Care Plan       Problems:  Recent Hospitalization for treatment of CHF and HTN Knowledge Deficit Related to BP cuff wrist verses arm, encouraged patient to take them both to follow up appointments to have cuff calibrated.  Goal:  Over the next 30 days, the patient will not experience hospital readmission  Interventions:   Heart Failure Interventions: Provided education on low sodium diet Assessed need for readable accurate scales in home Discussed importance of daily weight and advised patient to weigh and record daily Reviewed role of diuretics in prevention of fluid overload and management of heart failure; Discussed the importance of keeping all appointments with provider Screening for signs and symptoms of depression related to chronic disease state  Assessed social determinant of health barriers   Hypertension Interventions: Last practice recorded BP readings:  BP Readings from Last 3 Encounters:  12/19/24 (!) 140/65  12/16/24 (!) 128/59  12/13/24 (!) 212/98   Most recent eGFR/CrCl:  Lab Results  Component Value Date   EGFR 61 02/08/2024    No components found for: CRCL  Evaluation of current treatment plan related to hypertension self management and patient's adherence to plan as established by provider Provided education to patient re:  stroke prevention, s/s of heart attack and stroke Reviewed medications with patient and discussed importance of compliance Discussed plans with patient for ongoing care management follow up and provided patient with direct contact information for care management team Advised patient, providing education and rationale, to monitor blood pressure daily and record, calling PCP for findings outside established parameters Reviewed scheduled/upcoming provider appointments including:  Screening for signs and symptoms of depression related to chronic disease state  Assessed social determinant of health barriers  Patient Self Care Activities:  Attend all scheduled provider appointments Call pharmacy for medication refills 3-7 days in advance of running out of medications Call provider office for new concerns or questions  Notify RN Care Manager of TOC call rescheduling needs Participate in Transition of Care Program/Attend TOC scheduled calls Take medications as prescribed    Plan:  Telephone follow up appointment with care management team member scheduled for:  12/27/24 11 am The patient has been provided with contact information for the care management team and has been advised to call with any health related questions or concerns.  The patient will call PCP as advised to symptoms of hypertensive urgency.  Discussed and offered 30 day TOC program.  Patient agrees to weekly program calls.  The patient has been provided with contact information for  the care management team and has been advised to call with any health -related questions or concerns.  The patient verbalized understanding with current plan of care.          Richerd Fish, RN, BSN, CCM [facility], [facility] RN Care Manager Direct Dial: [phone]        "

## 2024-12-20 NOTE — Progress Notes (Signed)
 "   HEART & VASCULAR TRANSITION OF CARE CONSULT NOTE    Referring Physician: Dr. Maranda PCP: Sebastian Beverley NOVAK, MD  Cardiologist: Darryle ONEIDA Decent, MD  Chief Complaint: A/C HFpEF  HPI: Referred to clinic by Dr. Maranda for heart failure consultation.   Ricardo [name] is a 84 y.o. male with chronic HFpEF, resistant HTN (followed by HTN clinic), AAA, CAD (hx RCA stent 01'), hx of orthostatic hypotension and HLD.   Cath 07/09/20: mild-moderate multivessel disease managed medically.   Zio 9/22: no significant arrhythmias, rare ectopy.   Nuclear stress test 9/22: no ischemia/infarction  Hx of dizziness and hypotension, antihypertensives adjusted. Renal artery US  6/25: 1-59% stenosis in bilateral renal arteries.   Carotid duplex 8/25 with mild bilateral 1-39% stenosis.   Admitted 1/26 with A/C HFpEF. Reported 15 lb weight gain. Echo 1/26: EF 60-65%, LV with no RWMA, GIDD, mild concentric LVH, RV normal, no MR. Diuresed well with IV lasix . Had been recently started on minoxidil  for resistant hypertension. BP significantly elevated during admission, antihypertensive regimen adjusted. BP stable at discharge with close f/u in Advanced HTN clinic.   Today he presents for AHF [facility] clinic visit. Overall feeling ***. Denies palpitations, CP, dizziness, edema, or PND/Orthopnea. *** SOB. Appetite ok. No fever or chills. Weight at home *** pounds. Taking all medications. Denies ETOH, tobacco or drug use.    Past Medical History:  Diagnosis Date   AAA (abdominal aortic aneurysm)    Abdominal aneurysm 08/30/2018   Arthritis    SHOULDERS, NECK , RIGHT HIP   Bladder cancer (HCC) DX   OCT 2011--  FOLLOWED BY DR WOODRUFF   S/P TURBT,  HIGH GRADE BLADDER CANCER   CAD (coronary artery disease), native coronary artery 08/30/2018   normal Left main, 30% stenosis ostial LAD, 50% stenosis OM 3, 80% stenosis mid RCA;  2.5 x 8 mm Tetra Stent Dr. Claudene   Chronic diastolic heart failure (HCC)    Coronary  artery disease CARDIOLOGIST- DR BLANCA-  LAST VISIT 2 WKS AGO-- WILL REQUEST NOTE AND EKG   STRESS TEST -- JULY 2009   Essential hypertension 08/30/2018   Hyperlipidemia    Hypertension    Nocturia    Personal history of malignant neoplasm of bladder 08/30/2018   Post PTCA 2001-   X1 STENT TO RCA   AND 1996    Current Outpatient Medications  Medication Sig Dispense Refill   acetaminophen  (TYLENOL ) 500 MG tablet Take 1,000 mg by mouth daily as needed for headache (pain).     alfuzosin  (UROXATRAL ) 10 MG 24 hr tablet Take 1 tablet (10 mg total) by mouth daily with breakfast. 90 tablet 3   aspirin  81 MG tablet Take 1 tablet (81 mg total) by mouth daily. 1 tablet 0   Evolocumab  (REPATHA  SURECLICK) 140 MG/ML SOAJ Inject 140 mg into the skin once a week. 2 mL 11   finasteride  (PROSCAR ) 5 MG tablet Take 1 tablet (5 mg total) by mouth daily. 90 tablet 3   furosemide  (LASIX ) 40 MG tablet Take 1 tablet (40 mg total) by mouth daily as needed for fluid or edema (weight gain). 30 tablet 1   hydrALAZINE  (APRESOLINE ) 25 MG tablet Take 1 tablet (25 mg total) by mouth 3 (three) times daily. 120 tablet 2   isosorbide  mononitrate (IMDUR ) 60 MG 24 hr tablet Take 1 tablet (60 mg total) by mouth 2 (two) times daily. 180 tablet 3   nitroGLYCERIN  (NITROSTAT ) 0.4 MG SL tablet Dissolve 1 tablet under  the tongue for chest pain. May repeat in 5 minutes if needed call 911 max 3 tablets every 15 minutes. 25 tablet 3   spironolactone  (ALDACTONE ) 25 MG tablet Take 1 tablet (25 mg total) by mouth daily. 30 tablet 0   telmisartan  (MICARDIS ) 80 MG tablet Take 1 tablet (80 mg total) by mouth daily. 90 tablet 3   No current facility-administered medications for this visit.    Allergies[1]    Social History   Socioeconomic History   Marital status: Married    Spouse name: Not on file   Number of children: 4   Years of education: Not on file   Highest education level: Not on file  Occupational History   Occupation:  retired - advice worker  Tobacco Use   Smoking status: Former    Current packs/day: 0.00    Average packs/day: 1.5 packs/day for 5.0 years (7.5 ttl pk-yrs)    Types: Cigarettes    Start date: 11/05/1960    Quit date: 11/05/1965    Years since quitting: 59.1    Passive exposure: Past   Smokeless tobacco: Never  Vaping Use   Vaping status: Never Used  Substance and Sexual Activity   Alcohol use: No   Drug use: No   Sexual activity: Not Currently    Birth control/protection: None  Other Topics Concern   Not on file  Social History Narrative   Not on file   Social Drivers of Health   Tobacco Use: Medium Risk (12/14/2024)   Patient History    Smoking Tobacco Use: Former    Smokeless Tobacco Use: Never    Passive Exposure: Past  Physicist, Medical Strain: Not on file  Food Insecurity: No Food Insecurity (12/19/2024)   Epic    Worried About Programme Researcher, Broadcasting/film/video in the Last Year: Never true    Ran Out of Food in the Last Year: Never true  Transportation Needs: No Transportation Needs (12/19/2024)   Epic    Lack of Transportation (Medical): No    Lack of Transportation (Non-Medical): No  Physical Activity: Inactive (11/08/2024)   Exercise Vital Sign    Days of Exercise per Week: 0 days    Minutes of Exercise per Session: 0 min  Stress: Stress Concern Present (11/08/2024)   [employer] of Occupational Health - Occupational Stress Questionnaire    Feeling of Stress: Very much  Social Connections: Moderately Isolated (12/14/2024)   Social Connection and Isolation Panel    Frequency of Communication with Friends and Family: More than three times a week    Frequency of Social Gatherings with Friends and Family: Three times a week    Attends Religious Services: Never    Active Member of Clubs or Organizations: No    Attends Banker Meetings: Never    Marital Status: Married  Catering Manager Violence: Not At Risk (12/19/2024)   Epic    Fear of Current or Ex-Partner:  No    Emotionally Abused: No    Physically Abused: No    Sexually Abused: No  Depression (PHQ2-9): Low Risk (12/13/2024)   Depression (PHQ2-9)    PHQ-2 Score: 0  Recent Concern: Depression (PHQ2-9) - Medium Risk (11/08/2024)   Depression (PHQ2-9)    PHQ-2 Score: 6  Alcohol Screen: Not on file  Housing: Unknown (12/19/2024)   Epic    Unable to Pay for Housing in the Last Year: Not on file    Number of Times Moved in the Last Year: 0  Homeless in the Last Year: No  Utilities: Not At Risk (12/19/2024)   Epic    Threatened with loss of utilities: No  Health Literacy: Adequate Health Literacy (11/08/2024)   B1300 Health Literacy    Frequency of need for help with medical instructions: Never      Family History  Problem Relation Age of Onset   Diabetes Mother    Hypertension Mother     There were no vitals filed for this visit.  PHYSICAL EXAM: General:  *** appearing.  No respiratory difficulty Neck: JVD *** cm.  Cor: Regular rate & rhythm. No murmurs. Lungs: clear Extremities: no edema  Neuro: alert & oriented x 3. Affect pleasant.   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 Continue lasix  40 mg daily PRN - Continue spiro 25 mg daily - Continue Irbesartan   - Start SGLT2i, will need grant. *** A1c 6.2 last.  - Not a candidate for advanced therapies with advanced age.   Resistant HTN - Followed by Advanced HTN clinic - BP *** today - Continue hydralazine  and Imdur  - Continue telmisartan   - Continue spiro   CAD  - Cath 07/09/20: mild-moderate multivessel disease managed medically.  - Hx RCA stent 01' - denies CP - Continue ASA/repatha     Referred to HFSW (PCP, Medications, Transportation, ETOH Abuse, Drug Abuse, Insurance, Financial ): Yes or No Refer to Pharmacy: Yes or No Refer to Home Health: Yes on No Refer to Advanced Heart  Failure Clinic: Yes or no  Refer to General Cardiology: Yes or No  Follow up      [1]  Allergies Allergen Reactions   Clonidine  Hcl Other (See Comments)    Reaction to 0.2 mg tabs - caused dry mouth, drowsiness, lightheadedness - reported 07/08/2020 (no reaction to patch)   Cyclobenzaprine Other (See Comments)    Numbness in lips   Norvasc [Amlodipine Besylate] Other (See Comments)    CAUSES TEETH TO FALL OUT   "

## 2024-12-21 ENCOUNTER — Ambulatory Visit: Admitting: Family Medicine

## 2024-12-21 ENCOUNTER — Ambulatory Visit

## 2024-12-21 ENCOUNTER — Ambulatory Visit: Admitting: Cardiovascular Disease

## 2024-12-21 ENCOUNTER — Other Ambulatory Visit (HOSPITAL_COMMUNITY): Payer: Self-pay

## 2024-12-21 ENCOUNTER — Other Ambulatory Visit: Payer: Self-pay

## 2024-12-21 ENCOUNTER — Encounter: Payer: Self-pay | Admitting: Cardiovascular Disease

## 2024-12-21 VITALS — BP 130/50 | HR 76 | Ht 66.0 in | Wt 181.0 lb

## 2024-12-21 VITALS — BP 136/63 | HR 62 | Temp 97.7°F | Ht 66.0 in | Wt 176.6 lb

## 2024-12-21 DIAGNOSIS — R918 Other nonspecific abnormal finding of lung field: Secondary | ICD-10-CM

## 2024-12-21 DIAGNOSIS — I7143 Infrarenal abdominal aortic aneurysm, without rupture: Secondary | ICD-10-CM | POA: Diagnosis not present

## 2024-12-21 DIAGNOSIS — M25551 Pain in right hip: Secondary | ICD-10-CM

## 2024-12-21 DIAGNOSIS — I2510 Atherosclerotic heart disease of native coronary artery without angina pectoris: Secondary | ICD-10-CM

## 2024-12-21 DIAGNOSIS — G8929 Other chronic pain: Secondary | ICD-10-CM | POA: Diagnosis not present

## 2024-12-21 DIAGNOSIS — I951 Orthostatic hypotension: Secondary | ICD-10-CM

## 2024-12-21 DIAGNOSIS — I10 Essential (primary) hypertension: Secondary | ICD-10-CM

## 2024-12-21 DIAGNOSIS — E785 Hyperlipidemia, unspecified: Secondary | ICD-10-CM | POA: Diagnosis not present

## 2024-12-21 DIAGNOSIS — I5042 Chronic combined systolic (congestive) and diastolic (congestive) heart failure: Secondary | ICD-10-CM | POA: Insufficient documentation

## 2024-12-21 DIAGNOSIS — Z09 Encounter for follow-up examination after completed treatment for conditions other than malignant neoplasm: Secondary | ICD-10-CM

## 2024-12-21 DIAGNOSIS — N1831 Chronic kidney disease, stage 3a: Secondary | ICD-10-CM

## 2024-12-21 DIAGNOSIS — I6523 Occlusion and stenosis of bilateral carotid arteries: Secondary | ICD-10-CM

## 2024-12-21 DIAGNOSIS — I1A0 Resistant hypertension: Secondary | ICD-10-CM

## 2024-12-21 LAB — COMPREHENSIVE METABOLIC PANEL WITH GFR
ALT: 17 U/L (ref 3–53)
AST: 17 U/L (ref 5–37)
Albumin: 4.5 g/dL (ref 3.5–5.2)
Alkaline Phosphatase: 56 U/L (ref 39–117)
BUN: 33 mg/dL — ABNORMAL HIGH (ref 6–23)
CO2: 25 meq/L (ref 19–32)
Calcium: 9.5 mg/dL (ref 8.4–10.5)
Chloride: 107 meq/L (ref 96–112)
Creatinine, Ser: 1.12 mg/dL (ref 0.40–1.50)
GFR: 60.58 mL/min
Glucose, Bld: 95 mg/dL (ref 70–99)
Potassium: 4.7 meq/L (ref 3.5–5.1)
Sodium: 141 meq/L (ref 135–145)
Total Bilirubin: 0.7 mg/dL (ref 0.2–1.2)
Total Protein: 7 g/dL (ref 6.0–8.3)

## 2024-12-21 LAB — TROPONIN I (HIGH SENSITIVITY): High Sens Troponin I: 16 ng/L (ref 2–17)

## 2024-12-21 LAB — BRAIN NATRIURETIC PEPTIDE: Pro B Natriuretic peptide (BNP): 51 pg/mL (ref 1.0–100.0)

## 2024-12-21 LAB — MAGNESIUM: Magnesium: 2.3 mg/dL (ref 1.5–2.5)

## 2024-12-21 MED ORDER — NAPROXEN 250 MG PO TABS
250.0000 mg | ORAL_TABLET | Freq: Two times a day (BID) | ORAL | 1 refills | Status: AC | PRN
  Filled 2024-12-21: qty 60, 30d supply, fill #0

## 2024-12-21 NOTE — Patient Instructions (Signed)
 Medication Instructions:  Your physician recommends that you continue on your current medications as directed. Please refer to the Current Medication list given to you today.  *If you need a refill on your cardiac medications before your next appointment, please call your pharmacy*  Lab Work: None ordered If you have labs (blood work) drawn today and your tests are completely normal, you will receive your results only by: MyChart Message (if you have MyChart) OR A paper copy in the mail If you have any lab test that is abnormal or we need to change your treatment, we will call you to review the results.  Testing/Procedures: None ordered  Follow-Up: At [facility], you and your health needs are our priority.  As part of our continuing mission to provide you with exceptional heart care, our providers are all part of one team.  This team includes your primary Cardiologist (physician) and Advanced Practice Providers or APPs (Physician Assistants and Nurse Practitioners) who all work together to provide you with the care you need, when you need it.  Your next appointment:   6 month(s)  Provider:   Darryle ONEIDA Decent, MD    We recommend signing up for the patient portal called MyChart.  Sign up information is provided on this After Visit Summary.  MyChart is used to connect with patients for Virtual Visits (Telemedicine).  Patients are able to view lab/test results, encounter notes, upcoming appointments, etc.  Non-urgent messages can be sent to your provider as well.   To learn more about what you can do with MyChart, go to [email or web address].   Other Instructions Your physician has requested that you monitor and record your blood pressure readings twice per day for two weeks. Please use the same machine at the same times of day to check your readings and record them. Send them via MyChart in two weeks   Please monitor blood pressures and keep a log of your readings.    Make  sure to check 2 hours after your medications.    AVOID these things for 30 minutes before checking your blood pressure: No Drinking caffeine. No Drinking alcohol. No Eating. No Smoking. No Exercising.   Five minutes before checking your blood pressure: Pee. Sit in a dining chair. Avoid sitting in a soft couch or armchair. Be quiet. Do not talk

## 2024-12-21 NOTE — Patient Instructions (Addendum)
 It was very nice to see you today!  VISIT SUMMARY: During your visit, we discussed your recent hospitalization for heart failure exacerbation and addressed your ongoing health concerns including hypertension, chronic kidney disease, right hip pain, and a lung opacity seen on imaging. We have made adjustments to your medications and ordered follow-up tests to monitor your condition.  YOUR PLAN: OPACITY OF LUNG ON IMAGING STUDY: There is an unresolved right mid-lung opacity likely due to fluid overload from heart failure. -We have ordered a follow-up chest x-ray to ensure the resolution of the lung opacity.  CHRONIC COMBINED SYSTOLIC AND DIASTOLIC HEART FAILURE: You had a recent exacerbation with significant fluid overload, managed with IV diuretics during hospitalization. -Continue taking furosemide  40 mg orally daily as needed for swelling and weight gain. -Monitor your weight and fluid status regularly.  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 Recent hypomagnesemia was noted. -We have ordered a complete metabolic panel to check your renal function and electrolytes. -We have also ordered a magnesium level test due to your CKD. -Continue taking olmesartan for renal protection.  HOSPITAL DISCHARGE FOLLOW-UP: This is a follow-up after your recent hospitalization for acute and chronic heart failure exacerbation. Your blood pressure has improved with the current regimen. -Continue your current medication regimen. -Monitor your blood pressure and adjust the timing of your medications as needed.  CHRONIC RIGHT HIP PAIN: You have chronic right hip pain that is exacerbated by movement, likely due to osteoarthritis or other causes. -We have ordered an x-ray of your right hip to assess for osteoarthritis or other causes of pain. -You have been referred to an orthopedist for further evaluation and potential cortisone  injection. -We have prescribed naproxen  250 mg every 12 hours as needed for hip pain. -Consider using diclofenac gel for topical pain relief.  RESISTANT HYPERTENSION: Your blood pressure has improved but is still elevated in the morning. Your current regimen includes alfuzosin , isosorbide  mononitrate, and spironolactone . -We have adjusted the timing of alfuzosin  to the evening to improve blood pressure control. -Continue your current antihypertensive regimen. -Monitor your blood pressure and adjust medications as needed.  Return in about 3 months (around 03/20/2025) for BP.   Take care, Arvella Hummer, MD, MS   PLEASE NOTE:  If you had any lab tests, please let us  know if you have not heard back within a few days. You may see your results on mychart before we have a chance to review them but we will give you a call once they are reviewed by us .   If we ordered any referrals today, please let us  know if you have not heard from their office within the next week.   If you had any urgent prescriptions sent in today, please check with the pharmacy within an hour of our visit to make sure the prescription was transmitted appropriately.   Please try these tips to maintain a healthy lifestyle:  Eat at least 3 REAL meals and 1-2 snacks per day.  Aim for no more than 5 hours between eating.  If you eat breakfast, please do so within one hour of getting up.   Each meal should contain half fruits/vegetables, one quarter protein, and one quarter carbs (no bigger than a computer mouse)  Cut down on sweet beverages. This includes juice, soda, and sweet tea.   Drink at least 1 glass of water with each meal and aim for  at least 8 glasses per day  Exercise at least 150 minutes every week.

## 2024-12-21 NOTE — Progress Notes (Signed)
 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 - Ordered follow-up chest x-ray to ensure resolution of lung opacity.  Chronic combined systolic and diastolic heart failure (HCC) Recent exacerbation with significant fluid overload, managed with IV diuretics during hospitalization. Current management includes furosemide  as needed for swelling and weight gain. - Continue furosemide  40 mg oral daily PRN for swelling and weight gain. - Monitor weight and fluid status.  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 Managed with olmesartan for renal protection. Recent hypomagnesemia noted. - Ordered complete metabolic panel to check renal function and electrolytes. - Ordered magnesium level due to CKD3a. - Continue olmesartan for renal protection.  Hospital discharge follow-up Follow-up after recent hospitalization for acute and chronic heart failure exacerbation. Blood pressure improved with current regimen. - Continue current medication  regimen. - Monitor blood pressure and adjust timing of medications as needed.  Chronic right hip pain Exacerbated by movement, likely due to osteoarthritis or other causes. No prior imaging or orthopedic evaluation. - Ordered x-ray of right hip to assess for osteoarthritis or other causes of pain. - Referred to orthopedist for further evaluation and potential cortisone injection. - Prescribed naproxen  250 mg every 12 hours as needed for hip pain. - Consider diclofenac gel for topical pain relief.  Resistant hypertension Recent adjustment in medication regimen. Blood pressure improved but still elevated in the morning. Current regimen includes alfuzosin , isosorbide  mononitrate, and spironolactone . - Adjusted timing of alfuzosin  to evening to improve blood pressure control. - Continue current antihypertensive regimen. - Monitor blood pressure and adjust medications as needed.        There are no discontinued medications.  Return in about 3 months (around 03/20/2025) for BP.        Subjective:   Encounter date: 12/21/2024  Ricardo [name] is a 84 y.o. male who has CAD (coronary artery disease), native coronary artery; HLD (hyperlipidemia); Essential hypertension; Abdominal aneurysm; Personal history of malignant neoplasm of bladder; Bradycardia; Chest pain at rest; Abdominal aortic aneurysm (AAA) without rupture; Unstable angina (HCC); Sinus bradycardia; Hypertensive emergency; Pain in finger of right hand; Nasal valve collapse; Muscle tension dysphonia; Deviated nasal septum; Acquired trigger finger of right ring finger; Resistant hypertension; Amaurosis fugax; Cervical disc disease; Chronic post-traumatic headache, not intractable; Benign prostatic hyperplasia with lower urinary tract symptoms; Bacterial sinusitis; Chronic pain; Erectile dysfunction; Gastroesophageal reflux disease  without esophagitis; Hematuria; History of primary malignant neoplasm of urinary bladder; Impaired fasting  glucose; Malnutrition; Presence of coronary angioplasty implant and graft; Microalbuminuria; Chronic kidney disease due to hypertension; CKD stage G2/A2, GFR 60-89 and albumin creatinine ratio 30-299 mg/g; Tear of right rotator cuff; Thrombocytopenia; Prediabetes; 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   He  has a past medical history of AAA (abdominal aortic aneurysm), Abdominal aneurysm (08/30/2018), Arthritis, Bladder cancer (HCC) (DX   OCT 2011--  FOLLOWED BY DR ELIZA), CAD (coronary artery disease), native coronary artery (08/30/2018), Chronic diastolic heart failure (HCC), Coronary artery disease (CARDIOLOGIST- DR BLANCA-  LAST VISIT 2 WKS AGO-- WILL REQUEST NOTE AND EKG), Essential hypertension (08/30/2018), Hyperlipidemia, Hypertension, Nocturia, Personal history of malignant neoplasm of bladder (08/30/2018), and Post PTCA (2001-   X1 STENT TO RCA).SABRA   He presents with chief complaint of Medical Management of Chronic Issues (1 mon f/u for BP and Hip Pain ) .   Discussed the use of AI scribe software for clinical note transcription with the patient, who gave verbal consent to proceed.  History of Present Illness Ricardo [name] is an 84 year old male with acute and chronic heart failure exacerbation who presents for hospital follow-up.  Heart failure exacerbation - Hospitalized at [facility] from January 27 to December 16, 2024, for acute and chronic heart failure exacerbation. - Gained 15 pounds during hospitalization, treated with IV diuretics resulting in significant fluid loss and weight reduction. - Currently on furosemide  40 mg oral daily PRN for  swelling and weight gain. - No recent falls reported. - Reports compliance on treatment - Reports doing well - Follow up phone call on 12/20/2023 Hypertension - History of resistant hypertension. - Blood pressure elevated in the mornings, stabilizes after taking morning medications. - Current antihypertensive regimen includes alfuzosin  10 mg daily, isosorbide  mononitrate 60 mg TID, spironolactone  25 mg daily, and olmesartan 50 mg daily.  Chronic kidney disease - Chronic kidney disease stage 3A/A2. - GFR 45-59. - Albumin creatinine ratio 30-299. - Experienced hypomagnesemia during recent hospitalization.  Right hip pain - Chronic right hip pain, worsened by movement, especially when navigating stairs in his three-level home. - Uses a cane for mobility. - Takes Tylenol  for pain relief. - No previous orthopedic evaluations for hip pain. - Inquires about additional pain management options.  Pulmonary findings - Previous chest x-ray showed right mid-lung opacity, possibly due to fluid retention.     ROS  Past Surgical History:  Procedure Laterality Date   CORONARY ANGIOPLASTY  1996   CORONARY ANGIOPLASTY WITH STENT PLACEMENT  2001   STENT TO RCA   CYSTO/ BLADDER BX  10-22-10   CYSTOSCOPY W/ RETROGRADES  11/12/2011   Procedure: CYSTOSCOPY WITH RETROGRADE PYELOGRAM;  Surgeon: Toribio Neysa Eliza, MD;  Location: [facility];  Service: Urology;  Laterality: Bilateral;   LEFT HEART CATH AND CORONARY ANGIOGRAPHY N/A 07/09/2020   Procedure: LEFT HEART CATH AND CORONARY ANGIOGRAPHY;  Surgeon: Claudene Pacific, MD;  Location: [facility];  Service: Cardiovascular;  Laterality: N/A;   LUMBAR FUSION     TRANSURETHRAL RESECTION OF BLADDER TUMOR  08-27-10   RIGHT ANTERIOR DOME   TRANSURETHRAL RESECTION OF BLADDER  TUMOR  11/12/2011   Procedure: TRANSURETHRAL RESECTION OF BLADDER TUMOR (TURBT);  Surgeon: Toribio Neysa Repine, MD;  Location[facility];   Service: Urology;  Laterality: N/A;  with PK Gyrus c-arm gyrus  digital ureteroscope    Outpatient Medications Prior to Visit  Medication Sig Dispense Refill   acetaminophen  (TYLENOL ) 500 MG tablet Take 1,000 mg by mouth daily as needed for headache (pain).     alfuzosin  (UROXATRAL ) 10 MG 24 hr tablet Take 1 tablet (10 mg total) by mouth daily with breakfast. 90 tablet 3   aspirin  81 MG tablet Take 1 tablet (81 mg total) by mouth daily. 1 tablet 0   Evolocumab  (REPATHA  SURECLICK) 140 MG/ML SOAJ Inject 140 mg into the skin once a week. 2 mL 11   finasteride  (PROSCAR ) 5 MG tablet Take 1 tablet (5 mg total) by mouth daily. 90 tablet 3   furosemide  (LASIX ) 40 MG tablet Take 1 tablet (40 mg total) by mouth daily as needed for fluid or edema (weight gain). 30 tablet 1   hydrALAZINE  (APRESOLINE ) 25 MG tablet Take 1 tablet (25 mg total) by mouth 3 (three) times daily. 120 tablet 2   isosorbide  mononitrate (IMDUR ) 60 MG 24 hr tablet Take 1 tablet (60 mg total) by mouth 2 (two) times daily. 180 tablet 3   nitroGLYCERIN  (NITROSTAT ) 0.4 MG SL tablet Dissolve 1 tablet under the tongue for chest pain. May repeat in 5 minutes if needed call 911 max 3 tablets every 15 minutes. 25 tablet 3   spironolactone  (ALDACTONE ) 25 MG tablet Take 1 tablet (25 mg total) by mouth daily. 30 tablet 0   telmisartan  (MICARDIS ) 80 MG tablet Take 1 tablet (80 mg total) by mouth daily. 90 tablet 3   No facility-administered medications prior to visit.    Family History  Problem Relation Age of Onset   Diabetes Mother    Hypertension Mother     Social History   Socioeconomic History   Marital status: Married    Spouse name: Not on file   Number of children: 4   Years of education: Not on file   Highest education level: Not on file  Occupational History   Occupation: retired - advice worker  Tobacco Use   Smoking status: Former    Current packs/day: 0.00    Average packs/day: 1.5 packs/day for 5.0 years (7.5 ttl  pk-yrs)    Types: Cigarettes    Start date: 11/05/1960    Quit date: 11/05/1965    Years since quitting: 59.1    Passive exposure: Past   Smokeless tobacco: Never  Vaping Use   Vaping status: Never Used  Substance and Sexual Activity   Alcohol use: No   Drug use: No   Sexual activity: Not Currently    Birth control/protection: None  Other Topics Concern   Not on file  Social History Narrative   Not on file   Social Drivers of Health   Tobacco Use: Medium Risk (12/14/2024)   Patient History    Smoking Tobacco Use: Former    Smokeless Tobacco Use: Never    Passive Exposure: Past  Physicist, Medical Strain: Not on file  Food Insecurity: No Food Insecurity (12/19/2024)   Epic    Worried About Programme Researcher, Broadcasting/film/video in the Last Year: Never true    Ran Out of Food in the Last Year: Never true  Transportation Needs: No Transportation Needs (12/19/2024)   Epic    Lack of Transportation (Medical): No  Lack of Transportation (Non-Medical): No  Physical Activity: Inactive (11/08/2024)   Exercise Vital Sign    Days of Exercise per Week: 0 days    Minutes of Exercise per Session: 0 min  Stress: Stress Concern Present (11/08/2024)   [employer] of Occupational Health - Occupational Stress Questionnaire    Feeling of Stress: Very much  Social Connections: Moderately Isolated (12/14/2024)   Social Connection and Isolation Panel    Frequency of Communication with Friends and Family: More than three times a week    Frequency of Social Gatherings with Friends and Family: Three times a week    Attends Religious Services: Never    Active Member of Clubs or Organizations: No    Attends Banker Meetings: Never    Marital Status: Married  Catering Manager Violence: Not At Risk (12/19/2024)   Epic    Fear of Current or Ex-Partner: No    Emotionally Abused: No    Physically Abused: No    Sexually Abused: No  Depression (PHQ2-9): Low Risk (12/21/2024)   Depression (PHQ2-9)     PHQ-2 Score: 0  Recent Concern: Depression (PHQ2-9) - Medium Risk (11/08/2024)   Depression (PHQ2-9)    PHQ-2 Score: 6  Alcohol Screen: Not on file  Housing: Unknown (12/19/2024)   Epic    Unable to Pay for Housing in the Last Year: Not on file    Number of Times Moved in the Last Year: 0    Homeless in the Last Year: No  Utilities: Not At Risk (12/19/2024)   Epic    Threatened with loss of utilities: No  Health Literacy: Adequate Health Literacy (11/08/2024)   B1300 Health Literacy    Frequency of need for help with medical instructions: Never                                                                                                  Objective:  Physical Exam: BP 136/63   Pulse 62   Temp 97.7 F (36.5 C)   Ht 5' 6 (1.676 m)   Wt 176 lb 9.6 oz (80.1 kg)   SpO2 99%   BMI 28.50 kg/m    Physical Exam VITALS: BP- 136/63 GENERAL: Alert, cooperative, well developed, no acute distress HEENT: Normocephalic, normal oropharynx, moist mucous membranes CHEST: Clear to auscultation bilaterally, no wheezes, rhonchi, or crackles CARDIOVASCULAR: Normal heart rate and rhythm, S1 and S2 normal without murmurs ABDOMEN: Soft, non-tender, non-distended, without organomegaly, normal bowel sounds EXTREMITIES: No cyanosis or edema NEUROLOGICAL: Cranial nerves grossly intact, moves all extremities without gross motor or sensory deficit   Physical Exam  ECHOCARDIOGRAM COMPLETE Result Date: 12/14/2024    ECHOCARDIOGRAM REPORT   Patient Name:   Ricardo [name] [facility] Date of Exam: 12/14/2024 Medical Rec #:  [id number]      Height:       66.0 in Accession #:    [id number]     Weight:       182.8 lb Date of Birth:  1941-09-12      BSA:          1.925  m Patient Age:    83 years       BP:           150/65 mmHg Patient Gender: M              HR:           77 bpm. Exam Location:  Inpatient Procedure: 2D Echo, Cardiac Doppler and Color Doppler (Both Spectral and Color            Flow Doppler were utilized during  procedure). Indications:    CHF - Acute Diastolic  History:        Patient has prior history of Echocardiogram examinations, most                 recent 05/12/2021. CHF, CAD and Angina, Arrythmias:Bradycardia,                 Signs/Symptoms:Shortness of Breath and Chest Pain; Risk                 Factors:Dyslipidemia, Hypertension and Former Smoker.  Sonographer:    Juliene Rucks Referring Phys: [id number] JUSTIN B HOWERTER IMPRESSIONS  1. Left ventricular ejection fraction, by estimation, is 60 to 65%. The left ventricle has normal function. The left ventricle has no regional wall motion abnormalities. There is mild concentric left ventricular hypertrophy. Left ventricular diastolic parameters are consistent with Grade I diastolic dysfunction (impaired relaxation).  2. Right ventricular systolic function is normal. The right ventricular size is normal. Tricuspid regurgitation signal is inadequate for assessing PA pressure.  3. The mitral valve is normal in structure. No evidence of mitral valve regurgitation. No evidence of mitral stenosis.  4. The aortic valve is tricuspid. There is moderate calcification of the aortic valve. Aortic valve regurgitation is not visualized. Aortic valve sclerosis/calcification is present, without any evidence of aortic stenosis. FINDINGS  Left Ventricle: Left ventricular ejection fraction, by estimation, is 60 to 65%. The left ventricle has normal function. The left ventricle has no regional wall motion abnormalities. The left ventricular internal cavity size was normal in size. There is  mild concentric left ventricular hypertrophy. Left ventricular diastolic parameters are consistent with Grade I diastolic dysfunction (impaired relaxation). Right Ventricle: The right ventricular size is normal. No increase in right ventricular wall thickness. Right ventricular systolic function is normal. Tricuspid regurgitation signal is inadequate for assessing PA pressure. Left Atrium: Left atrial  size was normal in size. Right Atrium: Right atrial size was normal in size. Pericardium: There is no evidence of pericardial effusion. Mitral Valve: The mitral valve is normal in structure. Mild mitral annular calcification. No evidence of mitral valve regurgitation. No evidence of mitral valve stenosis. Tricuspid Valve: The tricuspid valve is normal in structure. Tricuspid valve regurgitation is trivial. No evidence of tricuspid stenosis. Aortic Valve: The aortic valve is tricuspid. There is moderate calcification of the aortic valve. Aortic valve regurgitation is not visualized. Aortic valve sclerosis/calcification is present, without any evidence of aortic stenosis. Pulmonic Valve: The pulmonic valve was normal in structure. Pulmonic valve regurgitation is not visualized. No evidence of pulmonic stenosis. Aorta: The aortic root is normal in size and structure. Venous: The inferior vena cava was not well visualized. IAS/Shunts: No atrial level shunt detected by color flow Doppler.  LEFT VENTRICLE PLAX 2D LVIDd:         3.90 cm   Diastology LVIDs:         2.70 cm   LV e' medial:    8.81 cm/s  LV PW:         1.20 cm   LV E/e' medial:  9.9 LV IVS:        1.40 cm   LV e' lateral:   9.14 cm/s LVOT diam:     1.90 cm   LV E/e' lateral: 9.5 LV SV:         66 LV SV Index:   34 LVOT Area:     2.84 cm  RIGHT VENTRICLE RV Basal diam:  2.60 cm RV Mid diam:    2.20 cm RV S prime:     14.80 cm/s TAPSE (M-mode): 2.1 cm LEFT ATRIUM             Index        RIGHT ATRIUM           Index LA diam:        3.50 cm 1.82 cm/m   RA Area:     14.70 cm LA Vol (A2C):   59.0 ml 30.66 ml/m  RA Volume:   29.30 ml  15.22 ml/m LA Vol (A4C):   78.3 ml 40.68 ml/m LA Biplane Vol: 74.1 ml 38.50 ml/m  AORTIC VALVE LVOT Vmax:   112.00 cm/s LVOT Vmean:  75.600 cm/s LVOT VTI:    0.233 m  AORTA Ao Root diam: 3.00 cm MITRAL VALVE MV Area (PHT): 2.76 cm     SHUNTS MV Decel Time: 275 msec     Systemic VTI:  0.23 m MV E velocity: 86.90 cm/s   Systemic  Diam: 1.90 cm MV A velocity: 111.00 cm/s MV E/A ratio:  0.78 Toribio Fuel MD Electronically signed by Toribio Fuel MD Signature Date/Time: 12/14/2024/3:27:47 PM    Final    CT Head Wo Contrast Result Date: 12/13/2024 CLINICAL DATA:  Headache history of bladder cancer EXAM: CT HEAD WITHOUT CONTRAST TECHNIQUE: Contiguous axial images were obtained from the base of the skull through the vertex without intravenous contrast. RADIATION DOSE REDUCTION: This exam was performed according to the departmental dose-optimization program which includes automated exposure control, adjustment of the mA and/or kV according to patient size and/or use of iterative reconstruction technique. COMPARISON:  MRI 06/12/2024 FINDINGS: Brain: No acute territorial infarction, hemorrhage or intracranial mass is visualized. Mild atrophy. Moderate white matter hypodensity consistent with chronic small vessel disease. The ventricles are nonenlarged Vascular: No hyperdense vessels.  Carotid vascular calcification Skull: Normal. Negative for fracture or focal lesion. Sinuses/Orbits: No acute finding. Other: None IMPRESSION: 1. No CT evidence for acute intracranial abnormality. 2. Atrophy and chronic small vessel ischemic changes of the white matter. Electronically Signed   By: Luke Bun M.D.   On: 12/13/2024 22:49   DG Chest 2 View Result Date: 12/13/2024 EXAM: 2 VIEW(S) XRAY OF THE CHEST 12/13/2024 05:04:43 PM COMPARISON: 04/21/2021 CLINICAL HISTORY: Shortness of breath, weight gain. FINDINGS: LUNGS AND PLEURA: Vague right mid lung opacity. No pleural effusion. No pneumothorax. HEART AND MEDIASTINUM: Aortic atherosclerosis. No acute abnormality of the cardiac and mediastinal silhouettes. BONES AND SOFT TISSUES: Thoracic spondylosis. IMPRESSION: 1. Vague right mid lung opacity; recommend follow-up chest radiograph to document resolution and exclude an underlying lesion. Electronically signed by: Oneil Devonshire MD 12/13/2024 05:40 PM EST  RP Workstation: [id number]    Recent Results (from the past 2160 hours)  Basic metabolic panel     Status: Abnormal   Collection Time: 12/13/24  4:50 PM  Result Value Ref Range   Sodium 140 135 - 145 mmol/L   Potassium 4.0 3.5 -  5.1 mmol/L   Chloride 105 98 - 111 mmol/L   CO2 25 22 - 32 mmol/L   Glucose, Bld 124 (H) 70 - 99 mg/dL    Comment: Glucose reference range applies only to samples taken after fasting for at least 8 hours.   BUN 16 8 - 23 mg/dL   Creatinine, Ser 8.83 0.61 - 1.24 mg/dL   Calcium  9.6 8.9 - 10.3 mg/dL   GFR, Estimated >39 >39 mL/min    Comment: (NOTE) Calculated using the CKD-EPI Creatinine Equation (2021)    Anion gap 10 5 - 15    Comment: Performed at [facility], [address] [place], [place] [address]  CBC     Status: Abnormal   Collection Time: 12/13/24  4:50 PM  Result Value Ref Range   WBC 6.5 4.0 - 10.5 K/uL   RBC 4.76 4.22 - 5.81 MIL/uL   Hemoglobin 13.5 13.0 - 17.0 g/dL   HCT 59.4 60.9 - 47.9 %   MCV 85.1 80.0 - 100.0 fL   MCH 28.4 26.0 - 34.0 pg   MCHC 33.3 30.0 - 36.0 g/dL   RDW 87.5 88.4 - 84.4 %   Platelets 131 (L) 150 - 400 K/uL   nRBC 0.0 0.0 - 0.2 %    Comment: Performed at [facility], [address] [place], [place] [address]  Pro Brain natriuretic peptide     Status: Abnormal   Collection Time: 12/13/24  4:50 PM  Result Value Ref Range   Pro Brain Natriuretic Peptide 1,260.0 (H) <300.0 pg/mL    Comment: (NOTE) Age Group        Cut-Points    Interpretation  < 50 years     450 pg/mL       NT-proBNP > 450 pg/mL indicates                                ADHF is likely              50 to 75 years  900 pg/mL      NT-proBNP > 900 pg/mL indicates          ADHF is likely  > 75 years      1800 pg/mL     NT-proBNP > 1800 pg/mL indicates          ADHF is likely                           All ages    Results between       Indeterminate. Further clinical             300 and the cut-   information is needed to  determine            point for age group   if ADHF is present.                                                             Elecsys proBNP II/ Elecsys proBNP II STAT           Cut-Point  Interpretation  300 pg/mL                    NT-proBNP <300pg/mL indicates                             ADHF is not likely  Performed at [facility], [address] [place], [place] [address]   Troponin T, High Sensitivity     Status: Abnormal   Collection Time: 12/13/24  5:07 PM  Result Value Ref Range   Troponin T High Sensitivity 27 (H) 0 - 19 ng/L    Comment: (NOTE) Biotin concentrations > 1000 ng/mL falsely decrease TnT results.  Serial cardiac troponin measurements are suggested.  Refer to the Links section for chest pain algorithms and additional  guidance. Performed at [facility], [address] [place], [place] [address]   Troponin T, High Sensitivity     Status: Abnormal   Collection Time: 12/13/24  8:09 PM  Result Value Ref Range   Troponin T High Sensitivity 28 (H) 0 - 19 ng/L    Comment: (NOTE) Biotin concentrations > 1000 ng/mL falsely decrease TnT results.  Serial cardiac troponin measurements are suggested.  Refer to the Links section for chest pain algorithms and additional  guidance. Performed at [facility], [address] [place], [place] [address]   MRSA Next Gen by PCR, Nasal     Status: None   Collection Time: 12/14/24 12:08 AM   Specimen: Nasal Mucosa; Nasal Swab  Result Value Ref Range   MRSA by PCR Next Gen NOT DETECTED NOT DETECTED    Comment: (NOTE) The GeneXpert MRSA Assay (FDA approved for NASAL specimens only), is one component of a comprehensive MRSA colonization surveillance program. It is not intended to diagnose MRSA infection nor to guide or monitor treatment for MRSA infections. Test performance is not FDA approved in patients less than 8 years old. Performed at [facility],  [address], [place], [place] [address]   CBC with Differential/Platelet     Status: Abnormal   Collection Time: 12/14/24  2:19 AM  Result Value Ref Range   WBC 6.3 4.0 - 10.5 K/uL   RBC 4.54 4.22 - 5.81 MIL/uL   Hemoglobin 12.8 (L) 13.0 - 17.0 g/dL   HCT 60.9 60.9 - 47.9 %   MCV 85.9 80.0 - 100.0 fL   MCH 28.2 26.0 - 34.0 pg   MCHC 32.8 30.0 - 36.0 g/dL   RDW 87.4 88.4 - 84.4 %   Platelets 124 (L) 150 - 400 K/uL   nRBC 0.0 0.0 - 0.2 %   Neutrophils Relative % 64 %   Neutro Abs 4.1 1.7 - 7.7 K/uL   Lymphocytes Relative 19 %   Lymphs Abs 1.2 0.7 - 4.0 K/uL   Monocytes Relative 13 %   Monocytes Absolute 0.8 0.1 - 1.0 K/uL   Eosinophils Relative 3 %   Eosinophils Absolute 0.2 0.0 - 0.5 K/uL   Basophils Relative 1 %   Basophils Absolute 0.1 0.0 - 0.1 K/uL   Immature Granulocytes 0 %   Abs Immature Granulocytes 0.01 0.00 - 0.07 K/uL    Comment: Performed at [facility], [address] [place], [place] [address]  Comprehensive metabolic panel with GFR     Status: Abnormal   Collection Time: 12/14/24  2:19 AM  Result Value Ref Range  Sodium 140 135 - 145 mmol/L   Potassium 4.0 3.5 - 5.1 mmol/L   Chloride 102 98 - 111 mmol/L   CO2 28 22 - 32 mmol/L   Glucose, Bld 180 (H) 70 - 99 mg/dL    Comment: Glucose reference range applies only to samples taken after fasting for at least 8 hours.   BUN 21 8 - 23 mg/dL   Creatinine, Ser 8.69 (H) 0.61 - 1.24 mg/dL   Calcium  9.1 8.9 - 10.3 mg/dL   Total Protein 6.6 6.5 - 8.1 g/dL   Albumin 4.1 3.5 - 5.0 g/dL   AST 22 15 - 41 U/L   ALT 17 0 - 44 U/L   Alkaline Phosphatase 70 38 - 126 U/L   Total Bilirubin 0.5 0.0 - 1.2 mg/dL   GFR, Estimated 55 (L) >60 mL/min    Comment: (NOTE) Calculated using the CKD-EPI Creatinine Equation (2021)    Anion gap 10 5 - 15    Comment: Performed at [facility], [address], [place], [place] [address]  Magnesium     Status: None   Collection Time: 12/14/24  2:19 AM  Result Value Ref Range    Magnesium 2.4 1.7 - 2.4 mg/dL    Comment: Performed at [facility], [address], [place], [place] [address]  TSH     Status: None   Collection Time: 12/14/24  2:19 AM  Result Value Ref Range   TSH 2.610 0.350 - 4.500 uIU/mL    Comment: Performed at [facility], [address], [place], [place] [address]  T4, free     Status: None   Collection Time: 12/14/24  2:19 AM  Result Value Ref Range   Free T4 1.56 0.80 - 2.00 ng/dL    Comment: Performed at [facility], [address], [place], [place] [address]  Pro Brain natriuretic peptide     Status: Abnormal   Collection Time: 12/14/24  2:19 AM  Result Value Ref Range   Pro Brain Natriuretic Peptide 1,416.0 (H) <300.0 pg/mL    Comment: (NOTE) Age Group        Cut-Points    Interpretation  < 50 years     450 pg/mL       NT-proBNP > 450 pg/mL indicates                                ADHF is likely              50 to 75 years  900 pg/mL      NT-proBNP > 900 pg/mL indicates          ADHF is likely  > 75 years      1800 pg/mL     NT-proBNP > 1800 pg/mL indicates          ADHF is likely                           All ages    Results between       Indeterminate. Further clinical             300 and the cut-   information is needed to determine            point for age group   if ADHF is present.  Elecsys proBNP II/ Elecsys proBNP II STAT           Cut-Point                       Interpretation  300 pg/mL                    NT-proBNP <300pg/mL indicates                             ADHF is not likely  Performed at [facility], [address] [place], [place] [address]   Procalcitonin     Status: None   Collection Time: 12/14/24  2:19 AM  Result Value Ref Range   Procalcitonin <0.10 ng/mL    Comment: (NOTE)   Sepsis PCT Algorithm          Lower Respiratory Tract Infection                                         PCT  Algorithm -----------------------------------------------------------------  <0.5 ng/mL                    <0.10 ng/mL  Associated with low           Antibiotic therapy strongly   risk for progression          discouraged. Indicates absence   to severe sepsis              of bacteria infection  and/or septic shock             --------------------------------------------------------------  0.5-2.0 ng/mL                 0.10-0.25 ng/mL  Recommended to retest         Antibiotic therapy discouraged.  PCT within 6-24 hours         Bacterial infection unlikely  ------------------------------------------------------------  >2 ng/mL                      0.26-0.50 ng/mL  Associated with high risk     Antibiotic therapy encouraged.  for progression to severe     Bacterial infection possible  sepsis/and or septic shock    ------------------------------                                 >0.50 ng/mL                                Antibiotic therapy strongly                                 encouraged.                                Suggestive of presence of                                 bacterial infection.                                 -------------------------------------------------------------------  <  or = 0.50 ng/mL OR          < or = 0.25 OR 80% decrease in PCT  80% decrease in PCT           Antibiotic therapy   Antibiotic therapy may        may be discontinued  be discontinued                                 Performed at [facility], [address], [place], [place] [address]   Urine Drug Screen     Status: None   Collection Time: 12/14/24  3:35 AM  Result Value Ref Range   Opiates NEGATIVE NEGATIVE   Cocaine NEGATIVE NEGATIVE   Benzodiazepines NEGATIVE NEGATIVE   Amphetamines NEGATIVE NEGATIVE   Tetrahydrocannabinol NEGATIVE NEGATIVE   Barbiturates NEGATIVE NEGATIVE   Methadone Scn, Ur NEGATIVE NEGATIVE   Fentanyl  NEGATIVE NEGATIVE    Comment: (NOTE) Drug screen is for  Medical Purposes only. Positive results are preliminary only. If confirmation is needed, notify lab within 5 days.  Drug Class                 Cutoff (ng/mL) Amphetamine and metabolites 1000 Barbiturate and metabolites 200 Benzodiazepine              200 Opiates and metabolites     300 Cocaine and metabolites     300 THC                         50 Fentanyl                     5 Methadone                   300  Trazodone is metabolized in vivo to several metabolites,  including pharmacologically active m-CPP, which is excreted in the  urine.  Immunoassay screens for amphetamines and MDMA have potential  cross-reactivity with these compounds and may provide false positive  result.  Performed at [facility], [address] [place], [place] [address]   Troponin T, High Sensitivity     Status: Abnormal   Collection Time: 12/14/24 10:15 AM  Result Value Ref Range   Troponin T High Sensitivity 25 (H) 0 - 19 ng/L    Comment: (NOTE) Biotin concentrations > 1000 ng/mL falsely decrease TnT results.  Serial cardiac troponin measurements are suggested.  Refer to the Links section for chest pain algorithms and additional  guidance. Performed at [facility], [address], [place], [place] [address]   ECHOCARDIOGRAM COMPLETE     Status: None   Collection Time: 12/14/24  1:49 PM  Result Value Ref Range   Weight 2,924.18 oz   Height 66 in   BP 152/61 mmHg   S' Lateral 2.70 cm   Area-P 1/2 2.76 cm2   Est EF 60 - 65%   Basic metabolic panel with GFR     Status: Abnormal   Collection Time: 12/15/24  9:05 AM  Result Value Ref Range   Sodium 138 135 - 145 mmol/L   Potassium 3.8 3.5 - 5.1 mmol/L   Chloride 103 98 - 111 mmol/L   CO2 25 22 - 32 mmol/L   Glucose, Bld 128 (H) 70 - 99 mg/dL    Comment: Glucose reference range applies only to samples taken  after fasting for at least 8 hours.   BUN 26 (H) 8 - 23 mg/dL   Creatinine, Ser 8.77 0.61 - 1.24 mg/dL   Calcium  8.9  8.9 - 10.3 mg/dL   GFR, Estimated 59 (L) >60 mL/min    Comment: (NOTE) Calculated using the CKD-EPI Creatinine Equation (2021)    Anion gap 10 5 - 15    Comment: Performed at [facility], [address] [place], [place] [address]  Magnesium     Status: None   Collection Time: 12/15/24  9:05 AM  Result Value Ref Range   Magnesium 2.3 1.7 - 2.4 mg/dL    Comment: Performed at [facility], [address] [place], [place] [address]  Basic metabolic panel with GFR     Status: Abnormal   Collection Time: 12/16/24  2:25 AM  Result Value Ref Range   Sodium 142 135 - 145 mmol/L   Potassium 4.1 3.5 - 5.1 mmol/L   Chloride 103 98 - 111 mmol/L   CO2 27 22 - 32 mmol/L   Glucose, Bld 99 70 - 99 mg/dL    Comment: Glucose reference range applies only to samples taken after fasting for at least 8 hours.   BUN 32 (H) 8 - 23 mg/dL   Creatinine, Ser 8.71 (H) 0.61 - 1.24 mg/dL   Calcium  8.9 8.9 - 10.3 mg/dL   GFR, Estimated 56 (L) >60 mL/min    Comment: (NOTE) Calculated using the CKD-EPI Creatinine Equation (2021)    Anion gap 12 5 - 15    Comment: Performed at [facility], [address] [place], [place] [address]  Magnesium     Status: None   Collection Time: 12/16/24  2:25 AM  Result Value Ref Range   Magnesium 2.2 1.7 - 2.4 mg/dL    Comment: Performed at [facility], [address], [place], [place] [address]        Beverley Adine Hummer, MD, MS "

## 2024-12-22 ENCOUNTER — Ambulatory Visit: Payer: Self-pay | Admitting: Family Medicine

## 2024-12-22 ENCOUNTER — Telehealth (HOSPITAL_COMMUNITY): Payer: Self-pay

## 2024-12-22 DIAGNOSIS — G8929 Other chronic pain: Secondary | ICD-10-CM

## 2024-12-22 NOTE — Telephone Encounter (Signed)
 Called to confirm/remind patient of their appointment at the [facility] on 12/22/24.   Appointment:   [x] Confirmed  [] Left mess   [] No answer/No voice mail  [] VM Full/unable to leave message  [] Phone not in service  Patient reminded to bring all medications and/or complete list.  Confirmed patient has transportation. Gave directions, instructed to utilize valet parking.

## 2024-12-23 ENCOUNTER — Other Ambulatory Visit (HOSPITAL_COMMUNITY): Payer: Self-pay

## 2024-12-23 ENCOUNTER — Ambulatory Visit (HOSPITAL_COMMUNITY): Payer: Self-pay | Admitting: Internal Medicine

## 2024-12-23 ENCOUNTER — Encounter (HOSPITAL_COMMUNITY): Payer: Self-pay

## 2024-12-23 ENCOUNTER — Ambulatory Visit (HOSPITAL_COMMUNITY): Admit: 2024-12-23

## 2024-12-23 ENCOUNTER — Telehealth (HOSPITAL_COMMUNITY): Payer: Self-pay

## 2024-12-23 VITALS — BP 132/70 | HR 68 | Wt 177.6 lb

## 2024-12-23 DIAGNOSIS — I2510 Atherosclerotic heart disease of native coronary artery without angina pectoris: Secondary | ICD-10-CM

## 2024-12-23 DIAGNOSIS — I1A0 Resistant hypertension: Secondary | ICD-10-CM

## 2024-12-23 DIAGNOSIS — I5032 Chronic diastolic (congestive) heart failure: Secondary | ICD-10-CM

## 2024-12-23 LAB — BASIC METABOLIC PANEL WITH GFR
Anion gap: 9 (ref 5–15)
BUN: 30 mg/dL — ABNORMAL HIGH (ref 8–23)
CO2: 25 mmol/L (ref 22–32)
Calcium: 9.4 mg/dL (ref 8.9–10.3)
Chloride: 108 mmol/L (ref 98–111)
Creatinine, Ser: 1.13 mg/dL (ref 0.61–1.24)
GFR, Estimated: >60 mL/min
Glucose, Bld: 108 mg/dL — ABNORMAL HIGH (ref 70–99)
Potassium: 5 mmol/L (ref 3.5–5.1)
Sodium: 142 mmol/L (ref 135–145)

## 2024-12-23 LAB — PRO BRAIN NATRIURETIC PEPTIDE: Pro Brain Natriuretic Peptide: 129 pg/mL

## 2024-12-23 MED ORDER — DAPAGLIFLOZIN PROPANEDIOL 10 MG PO TABS
10.0000 mg | ORAL_TABLET | Freq: Every day | ORAL | 5 refills | Status: AC
  Filled 2024-12-23: qty 30, 30d supply, fill #0

## 2024-12-23 NOTE — Telephone Encounter (Signed)
 [facility] Patient Advocate Encounter  This patient was approved for a [employer] grant that will help cover the cost of Farxiga , Spironolactone .  Total amount awarded, $7,500.  Effective: 11/23/2024 - 11/22/2025.  BIN [id number] PCN [id number] [id number] ID [id number]  Pharmacy provided with approval and processing information. Patient informed via pharmacy.  Rachel DEL, CPhT Rx Patient Advocate Phone: [phone]

## 2024-12-23 NOTE — Patient Instructions (Signed)
 Medication Changes:  START FARXIGA  10MG  ONCE DAILY   Lab Work:  Labs done today, your results will be available in MyChart, we will contact you for abnormal readings.  AND THEN LABS AGAIN IN 1 WEEK AS SCHEDULED   Follow-Up in: AS NEEDED WITH US    CONTINUE TO FOLLOW UP WITH GENERAL CARDIOLOGY   At the [facility], you and your health needs are our priority. We have a designated team specialized in the treatment of Heart Failure. This Care Team includes your primary Heart Failure Specialized Cardiologist (physician), Advanced Practice Providers (APPs- Physician Assistants and Nurse Practitioners), and Pharmacist who all work together to provide you with the care you need, when you need it.   You may see any of the following providers on your designated Care Team at your next follow up:  Dr. Toribio Fuel Dr. Ezra Shuck Dr. Odis Brownie Greig Mosses, NP Caffie Shed, [place] [facility] [place], [place] Beckey Coe, NP Jordan Lee, NP Tinnie Redman, PharmD   Please be sure to bring in all your medications bottles to every appointment.   Need to Contact Us :  If you have any questions or concerns before your next appointment please send us  a message through [place] or call our office at [phone].    TO LEAVE A MESSAGE FOR THE NURSE SELECT OPTION 2, PLEASE LEAVE A MESSAGE INCLUDING: YOUR NAME DATE OF BIRTH CALL BACK NUMBER REASON FOR CALL**this is important as we prioritize the call backs  YOU WILL RECEIVE A CALL BACK THE SAME DAY AS LONG AS YOU CALL BEFORE 4:00 PM

## 2024-12-27 ENCOUNTER — Telehealth

## 2024-12-30 ENCOUNTER — Ambulatory Visit (HOSPITAL_COMMUNITY)

## 2025-01-04 ENCOUNTER — Ambulatory Visit: Admitting: Orthopaedic Surgery

## 2025-01-26 ENCOUNTER — Encounter (HOSPITAL_BASED_OUTPATIENT_CLINIC_OR_DEPARTMENT_OTHER): Admitting: Family

## 2025-03-02 ENCOUNTER — Inpatient Hospital Stay: Admitting: Medical Oncology

## 2025-03-02 ENCOUNTER — Inpatient Hospital Stay

## 2025-03-20 ENCOUNTER — Ambulatory Visit: Admitting: Family Medicine
